# Patient Record
Sex: Male | Born: 1935 | Race: White | Hispanic: Yes | Marital: Married | Smoking: Never smoker
Health system: Southern US, Community
[De-identification: ages and names within clinical notes are randomized; demographics above are authoritative.]

## PROBLEM LIST (undated history)

## (undated) DIAGNOSIS — E039 Hypothyroidism, unspecified: Secondary | ICD-10-CM

## (undated) DIAGNOSIS — M5136 Other intervertebral disc degeneration, lumbar region: Secondary | ICD-10-CM

## (undated) DIAGNOSIS — M1990 Unspecified osteoarthritis, unspecified site: Secondary | ICD-10-CM

## (undated) DIAGNOSIS — G4733 Obstructive sleep apnea (adult) (pediatric): Secondary | ICD-10-CM

## (undated) DIAGNOSIS — M5030 Other cervical disc degeneration, unspecified cervical region: Secondary | ICD-10-CM

## (undated) DIAGNOSIS — F419 Anxiety disorder, unspecified: Secondary | ICD-10-CM

## (undated) DIAGNOSIS — H8113 Benign paroxysmal vertigo, bilateral: Secondary | ICD-10-CM

## (undated) DIAGNOSIS — IMO0001 Reserved for inherently not codable concepts without codable children: Secondary | ICD-10-CM

## (undated) DIAGNOSIS — K219 Gastro-esophageal reflux disease without esophagitis: Secondary | ICD-10-CM

## (undated) DIAGNOSIS — R413 Other amnesia: Secondary | ICD-10-CM

## (undated) DIAGNOSIS — E785 Hyperlipidemia, unspecified: Secondary | ICD-10-CM

## (undated) DIAGNOSIS — N320 Bladder-neck obstruction: Secondary | ICD-10-CM

## (undated) DIAGNOSIS — R29898 Other symptoms and signs involving the musculoskeletal system: Secondary | ICD-10-CM

## (undated) DIAGNOSIS — H4060X0 Glaucoma secondary to drugs, unspecified eye, stage unspecified: Secondary | ICD-10-CM

## (undated) DIAGNOSIS — G4700 Insomnia, unspecified: Secondary | ICD-10-CM

## (undated) DIAGNOSIS — R918 Other nonspecific abnormal finding of lung field: Secondary | ICD-10-CM

## (undated) DIAGNOSIS — Z974 Presence of external hearing-aid: Secondary | ICD-10-CM

## (undated) DIAGNOSIS — F329 Major depressive disorder, single episode, unspecified: Secondary | ICD-10-CM

## (undated) DIAGNOSIS — D229 Melanocytic nevi, unspecified: Secondary | ICD-10-CM

## (undated) DIAGNOSIS — J302 Other seasonal allergic rhinitis: Secondary | ICD-10-CM

## (undated) DIAGNOSIS — I739 Peripheral vascular disease, unspecified: Secondary | ICD-10-CM

## (undated) DIAGNOSIS — F32A Depression, unspecified: Secondary | ICD-10-CM

## (undated) DIAGNOSIS — R51 Headache: Secondary | ICD-10-CM

## (undated) DIAGNOSIS — R10826 Epigastric rebound abdominal tenderness: Secondary | ICD-10-CM

## (undated) DIAGNOSIS — G250 Essential tremor: Secondary | ICD-10-CM

## (undated) DIAGNOSIS — T380X5A Adverse effect of glucocorticoids and synthetic analogues, initial encounter: Secondary | ICD-10-CM

## (undated) DIAGNOSIS — N183 Chronic kidney disease, stage 3 unspecified: Secondary | ICD-10-CM

## (undated) DIAGNOSIS — R399 Unspecified symptoms and signs involving the genitourinary system: Secondary | ICD-10-CM

## (undated) DIAGNOSIS — E042 Nontoxic multinodular goiter: Secondary | ICD-10-CM

## (undated) DIAGNOSIS — K449 Diaphragmatic hernia without obstruction or gangrene: Secondary | ICD-10-CM

## (undated) DIAGNOSIS — J4530 Mild persistent asthma, uncomplicated: Secondary | ICD-10-CM

## (undated) DIAGNOSIS — M51369 Other intervertebral disc degeneration, lumbar region without mention of lumbar back pain or lower extremity pain: Secondary | ICD-10-CM

## (undated) DIAGNOSIS — Z9989 Dependence on other enabling machines and devices: Secondary | ICD-10-CM

## (undated) DIAGNOSIS — Z973 Presence of spectacles and contact lenses: Secondary | ICD-10-CM

## (undated) DIAGNOSIS — K08109 Complete loss of teeth, unspecified cause, unspecified class: Secondary | ICD-10-CM

## (undated) HISTORY — PX: BACK SURGERY: SHX140

## (undated) HISTORY — DX: Headache: R51

## (undated) HISTORY — PX: OTHER SURGICAL HISTORY: SHX169

## (undated) HISTORY — DX: Melanocytic nevi, unspecified: D22.9

## (undated) HISTORY — PX: CATARACT EXTRACTION W/ INTRAOCULAR LENS IMPLANT: SHX1309

## (undated) HISTORY — PX: CORNEAL TRANSPLANT: SHX108

## (undated) HISTORY — DX: Other intervertebral disc degeneration, lumbar region without mention of lumbar back pain or lower extremity pain: M51.369

## (undated) HISTORY — DX: Epigastric rebound abdominal tenderness: R10.826

## (undated) HISTORY — DX: Reserved for inherently not codable concepts without codable children: IMO0001

## (undated) HISTORY — DX: Obstructive sleep apnea (adult) (pediatric): G47.33

## (undated) HISTORY — DX: Insomnia, unspecified: G47.00

## (undated) HISTORY — DX: Major depressive disorder, single episode, unspecified: F32.9

## (undated) HISTORY — DX: Anxiety disorder, unspecified: F41.9

## (undated) HISTORY — DX: Hypothyroidism, unspecified: E03.9

## (undated) HISTORY — DX: Depression, unspecified: F32.A

## (undated) HISTORY — DX: Unspecified osteoarthritis, unspecified site: M19.90

## (undated) HISTORY — DX: Hyperlipidemia, unspecified: E78.5

## (undated) HISTORY — PX: EYE SURGERY: SHX253

## (undated) HISTORY — PX: CHOLECYSTECTOMY: SHX55

## (undated) HISTORY — PX: LUMBAR FUSION: SHX111

## (undated) HISTORY — PX: CHOLECYSTECTOMY OPEN: SUR202

## (undated) HISTORY — DX: Diaphragmatic hernia without obstruction or gangrene: K44.9

## (undated) HISTORY — DX: Gastro-esophageal reflux disease without esophagitis: K21.9

## (undated) HISTORY — DX: Other intervertebral disc degeneration, lumbar region: M51.36

---
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
## 1998-11-08 ENCOUNTER — Ambulatory Visit (HOSPITAL_COMMUNITY): Admission: RE | Admit: 1998-11-08 | Discharge: 1998-11-08 | Payer: Self-pay | Admitting: *Deleted

## 1999-05-26 ENCOUNTER — Emergency Department (HOSPITAL_COMMUNITY): Admission: EM | Admit: 1999-05-26 | Discharge: 1999-05-26 | Payer: Self-pay | Admitting: Emergency Medicine

## 2000-04-10 ENCOUNTER — Ambulatory Visit (HOSPITAL_COMMUNITY): Admission: RE | Admit: 2000-04-10 | Discharge: 2000-04-10 | Payer: Self-pay | Admitting: Infectious Diseases

## 2000-04-10 ENCOUNTER — Encounter: Payer: Self-pay | Admitting: Infectious Diseases

## 2000-05-24 ENCOUNTER — Emergency Department (HOSPITAL_COMMUNITY): Admission: EM | Admit: 2000-05-24 | Discharge: 2000-05-24 | Payer: Self-pay | Admitting: Emergency Medicine

## 2000-06-11 ENCOUNTER — Ambulatory Visit (HOSPITAL_COMMUNITY): Admission: RE | Admit: 2000-06-11 | Discharge: 2000-06-11 | Payer: Self-pay | Admitting: Family Medicine

## 2000-06-11 ENCOUNTER — Encounter: Payer: Self-pay | Admitting: Family Medicine

## 2001-04-09 ENCOUNTER — Emergency Department (HOSPITAL_COMMUNITY): Admission: EM | Admit: 2001-04-09 | Discharge: 2001-04-10 | Payer: Self-pay | Admitting: Emergency Medicine

## 2001-04-09 ENCOUNTER — Encounter: Payer: Self-pay | Admitting: Emergency Medicine

## 2001-07-01 ENCOUNTER — Emergency Department (HOSPITAL_COMMUNITY): Admission: EM | Admit: 2001-07-01 | Discharge: 2001-07-01 | Payer: Self-pay | Admitting: Emergency Medicine

## 2002-06-10 ENCOUNTER — Encounter: Payer: Self-pay | Admitting: Internal Medicine

## 2002-06-10 ENCOUNTER — Encounter: Admission: RE | Admit: 2002-06-10 | Discharge: 2002-06-10 | Payer: Self-pay | Admitting: Internal Medicine

## 2002-07-28 ENCOUNTER — Ambulatory Visit (HOSPITAL_COMMUNITY): Admission: RE | Admit: 2002-07-28 | Discharge: 2002-07-28 | Payer: Self-pay | Admitting: Ophthalmology

## 2003-07-24 ENCOUNTER — Encounter (INDEPENDENT_AMBULATORY_CARE_PROVIDER_SITE_OTHER): Payer: Self-pay | Admitting: *Deleted

## 2003-07-24 ENCOUNTER — Ambulatory Visit (HOSPITAL_COMMUNITY): Admission: RE | Admit: 2003-07-24 | Discharge: 2003-07-24 | Payer: Self-pay | Admitting: Ophthalmology

## 2003-11-28 ENCOUNTER — Ambulatory Visit (HOSPITAL_COMMUNITY): Admission: RE | Admit: 2003-11-28 | Discharge: 2003-11-28 | Payer: Self-pay

## 2003-11-28 ENCOUNTER — Emergency Department (HOSPITAL_COMMUNITY): Admission: EM | Admit: 2003-11-28 | Discharge: 2003-11-28 | Payer: Self-pay | Admitting: Emergency Medicine

## 2003-12-29 ENCOUNTER — Ambulatory Visit: Payer: Self-pay | Admitting: *Deleted

## 2003-12-29 ENCOUNTER — Ambulatory Visit: Payer: Self-pay | Admitting: Family Medicine

## 2004-01-01 ENCOUNTER — Ambulatory Visit: Payer: Self-pay | Admitting: Nurse Practitioner

## 2004-01-11 ENCOUNTER — Ambulatory Visit: Payer: Self-pay | Admitting: Family Medicine

## 2004-01-22 ENCOUNTER — Ambulatory Visit: Payer: Self-pay | Admitting: Internal Medicine

## 2004-02-26 ENCOUNTER — Ambulatory Visit: Payer: Self-pay | Admitting: Family Medicine

## 2004-04-08 ENCOUNTER — Ambulatory Visit: Payer: Self-pay | Admitting: Internal Medicine

## 2004-05-21 ENCOUNTER — Ambulatory Visit: Payer: Self-pay | Admitting: Internal Medicine

## 2004-06-18 ENCOUNTER — Ambulatory Visit: Payer: Self-pay | Admitting: Internal Medicine

## 2004-06-18 ENCOUNTER — Ambulatory Visit (HOSPITAL_BASED_OUTPATIENT_CLINIC_OR_DEPARTMENT_OTHER): Admission: RE | Admit: 2004-06-18 | Discharge: 2004-06-18 | Payer: Self-pay | Admitting: Internal Medicine

## 2004-07-09 ENCOUNTER — Ambulatory Visit: Payer: Self-pay | Admitting: Internal Medicine

## 2004-10-30 ENCOUNTER — Ambulatory Visit (HOSPITAL_COMMUNITY): Admission: RE | Admit: 2004-10-30 | Discharge: 2004-10-30 | Payer: Self-pay | Admitting: Internal Medicine

## 2004-10-30 ENCOUNTER — Ambulatory Visit: Payer: Self-pay | Admitting: Internal Medicine

## 2004-10-31 ENCOUNTER — Ambulatory Visit: Payer: Self-pay | Admitting: Internal Medicine

## 2004-11-01 ENCOUNTER — Ambulatory Visit (HOSPITAL_COMMUNITY): Admission: RE | Admit: 2004-11-01 | Discharge: 2004-11-01 | Payer: Self-pay | Admitting: Internal Medicine

## 2004-12-24 ENCOUNTER — Ambulatory Visit: Payer: Self-pay | Admitting: Internal Medicine

## 2005-01-30 ENCOUNTER — Ambulatory Visit: Payer: Self-pay | Admitting: Internal Medicine

## 2005-03-04 ENCOUNTER — Ambulatory Visit: Payer: Self-pay | Admitting: Internal Medicine

## 2005-06-26 ENCOUNTER — Ambulatory Visit: Payer: Self-pay | Admitting: Internal Medicine

## 2005-11-26 ENCOUNTER — Ambulatory Visit: Payer: Self-pay | Admitting: Family Medicine

## 2005-12-03 ENCOUNTER — Ambulatory Visit (HOSPITAL_COMMUNITY): Admission: RE | Admit: 2005-12-03 | Discharge: 2005-12-03 | Payer: Self-pay | Admitting: Family Medicine

## 2005-12-03 ENCOUNTER — Ambulatory Visit: Payer: Self-pay | Admitting: Family Medicine

## 2005-12-12 ENCOUNTER — Ambulatory Visit: Payer: Self-pay | Admitting: Internal Medicine

## 2006-10-15 ENCOUNTER — Ambulatory Visit: Payer: Self-pay | Admitting: Internal Medicine

## 2006-12-16 ENCOUNTER — Encounter (INDEPENDENT_AMBULATORY_CARE_PROVIDER_SITE_OTHER): Payer: Self-pay | Admitting: *Deleted

## 2006-12-16 DIAGNOSIS — D751 Secondary polycythemia: Secondary | ICD-10-CM | POA: Insufficient documentation

## 2006-12-16 DIAGNOSIS — E291 Testicular hypofunction: Secondary | ICD-10-CM | POA: Insufficient documentation

## 2006-12-16 DIAGNOSIS — M1710 Unilateral primary osteoarthritis, unspecified knee: Secondary | ICD-10-CM | POA: Insufficient documentation

## 2006-12-16 DIAGNOSIS — E039 Hypothyroidism, unspecified: Secondary | ICD-10-CM | POA: Insufficient documentation

## 2006-12-16 DIAGNOSIS — IMO0002 Reserved for concepts with insufficient information to code with codable children: Secondary | ICD-10-CM

## 2006-12-16 DIAGNOSIS — K219 Gastro-esophageal reflux disease without esophagitis: Secondary | ICD-10-CM | POA: Insufficient documentation

## 2006-12-16 DIAGNOSIS — E785 Hyperlipidemia, unspecified: Secondary | ICD-10-CM | POA: Insufficient documentation

## 2006-12-16 DIAGNOSIS — G4733 Obstructive sleep apnea (adult) (pediatric): Secondary | ICD-10-CM | POA: Insufficient documentation

## 2007-01-27 ENCOUNTER — Ambulatory Visit: Payer: Self-pay | Admitting: Nurse Practitioner

## 2007-01-27 DIAGNOSIS — M545 Low back pain, unspecified: Secondary | ICD-10-CM | POA: Insufficient documentation

## 2007-01-27 DIAGNOSIS — M79609 Pain in unspecified limb: Secondary | ICD-10-CM | POA: Insufficient documentation

## 2007-01-28 ENCOUNTER — Ambulatory Visit (HOSPITAL_COMMUNITY): Admission: RE | Admit: 2007-01-28 | Discharge: 2007-01-28 | Payer: Self-pay | Admitting: Nurse Practitioner

## 2007-01-28 ENCOUNTER — Encounter (INDEPENDENT_AMBULATORY_CARE_PROVIDER_SITE_OTHER): Payer: Self-pay | Admitting: Nurse Practitioner

## 2007-01-28 LAB — CONVERTED CEMR LAB
ALT: 36 units/L (ref 0–53)
AST: 28 units/L (ref 0–37)
Albumin: 4.9 g/dL (ref 3.5–5.2)
Alkaline Phosphatase: 73 units/L (ref 39–117)
BUN: 18 mg/dL (ref 6–23)
Basophils Absolute: 0 10*3/uL (ref 0.0–0.1)
Basophils Relative: 1 % (ref 0–1)
CO2: 24 meq/L (ref 19–32)
Calcium: 9.9 mg/dL (ref 8.4–10.5)
Chloride: 102 meq/L (ref 96–112)
Creatinine, Ser: 1.46 mg/dL (ref 0.40–1.50)
Eosinophils Absolute: 0.1 10*3/uL (ref 0.0–0.7)
Eosinophils Relative: 2 % (ref 0–5)
Glucose, Bld: 77 mg/dL (ref 70–99)
HCT: 50.3 % (ref 39.0–52.0)
Hemoglobin: 17 g/dL (ref 13.0–17.0)
Lymphocytes Relative: 28 % (ref 12–46)
Lymphs Abs: 1.9 10*3/uL (ref 0.7–3.3)
MCHC: 33.8 g/dL (ref 30.0–36.0)
MCV: 93 fL (ref 78.0–100.0)
Monocytes Absolute: 0.5 10*3/uL (ref 0.2–0.7)
Monocytes Relative: 8 % (ref 3–11)
Neutro Abs: 4 10*3/uL (ref 1.7–7.7)
Neutrophils Relative %: 61 % (ref 43–77)
Platelets: 243 10*3/uL (ref 150–400)
Potassium: 4 meq/L (ref 3.5–5.3)
RBC: 5.41 M/uL (ref 4.22–5.81)
RDW: 14 % (ref 11.5–14.0)
Sodium: 139 meq/L (ref 135–145)
TSH: 7.276 microintl units/mL — ABNORMAL HIGH (ref 0.350–5.50)
Total Bilirubin: 0.8 mg/dL (ref 0.3–1.2)
Total Protein: 7.7 g/dL (ref 6.0–8.3)
WBC: 6.6 10*3/uL (ref 4.0–10.5)

## 2007-01-29 ENCOUNTER — Encounter (INDEPENDENT_AMBULATORY_CARE_PROVIDER_SITE_OTHER): Payer: Self-pay | Admitting: Nurse Practitioner

## 2007-02-03 ENCOUNTER — Ambulatory Visit: Payer: Self-pay | Admitting: Nurse Practitioner

## 2007-02-03 LAB — CONVERTED CEMR LAB
Cholesterol: 208 mg/dL — ABNORMAL HIGH (ref 0–200)
HDL: 35 mg/dL — ABNORMAL LOW (ref >39)
LDL Cholesterol: 126 mg/dL — ABNORMAL HIGH (ref 0–99)
Total CHOL/HDL Ratio: 5.9
Triglycerides: 234 mg/dL — ABNORMAL HIGH (ref <150)
VLDL: 47 mg/dL — ABNORMAL HIGH (ref 0–40)

## 2007-02-04 ENCOUNTER — Encounter (INDEPENDENT_AMBULATORY_CARE_PROVIDER_SITE_OTHER): Payer: Self-pay | Admitting: Nurse Practitioner

## 2007-02-09 ENCOUNTER — Encounter (INDEPENDENT_AMBULATORY_CARE_PROVIDER_SITE_OTHER): Payer: Self-pay | Admitting: Nurse Practitioner

## 2007-03-01 ENCOUNTER — Ambulatory Visit: Payer: Self-pay | Admitting: Nurse Practitioner

## 2007-03-01 DIAGNOSIS — D239 Other benign neoplasm of skin, unspecified: Secondary | ICD-10-CM | POA: Insufficient documentation

## 2007-03-01 LAB — CONVERTED CEMR LAB
Cholesterol, target level: 200 mg/dL
HDL goal, serum: 40 mg/dL
LDL Goal: 130 mg/dL

## 2007-03-05 ENCOUNTER — Ambulatory Visit (HOSPITAL_COMMUNITY): Admission: RE | Admit: 2007-03-05 | Discharge: 2007-03-05 | Payer: Self-pay | Admitting: Family Medicine

## 2007-03-16 ENCOUNTER — Ambulatory Visit: Payer: Self-pay | Admitting: Nurse Practitioner

## 2007-03-18 ENCOUNTER — Encounter (INDEPENDENT_AMBULATORY_CARE_PROVIDER_SITE_OTHER): Payer: Self-pay | Admitting: Nurse Practitioner

## 2007-04-02 ENCOUNTER — Emergency Department (HOSPITAL_COMMUNITY): Admission: EM | Admit: 2007-04-02 | Discharge: 2007-04-02 | Payer: Self-pay | Admitting: Emergency Medicine

## 2007-04-02 LAB — CONVERTED CEMR LAB
BUN: 15 mg/dL
Basophils Absolute: 0 10*3/uL
Basophils Relative: 0 %
Bilirubin Urine: NEGATIVE
CO2: 26 meq/L
Calcium: 10 mg/dL
Chloride: 103 meq/L
Creatinine, Ser: 1.17 mg/dL
Eosinophils Absolute: 0.1 10*3/uL
Eosinophils Relative: 1 %
Glucose, Bld: 95 mg/dL
HCT: 50.3 %
Hemoglobin, Urine: NEGATIVE
Hemoglobin: 17.2 g/dL
Ketones, ur: NEGATIVE mg/dL
Leukocytes, UA: NEGATIVE
Lymphocytes Relative: 11 %
Lymphs Abs: 1.4 10*3/uL
MCHC: 34.2 g/dL
MCV: 93.7 fL
Monocytes Absolute: 0.7 10*3/uL
Monocytes Relative: 5 %
Neutro Abs: 10.8 10*3/uL
Neutrophils Relative %: 83 %
Nitrite: NEGATIVE
Platelets: 221 10*3/uL
Potassium: 3.7 meq/L
Protein, ur: NEGATIVE mg/dL
RBC: 5.37 M/uL
RDW: 13.7 %
Sodium: 136 meq/L
Specific Gravity, Urine: 1.014
Urine Glucose: NEGATIVE mg/dL
Urobilinogen, UA: 0.2
WBC: 13 10*3/uL
pH: 6.5

## 2007-04-07 ENCOUNTER — Ambulatory Visit: Payer: Self-pay | Admitting: Nurse Practitioner

## 2007-04-07 DIAGNOSIS — K649 Unspecified hemorrhoids: Secondary | ICD-10-CM | POA: Insufficient documentation

## 2007-04-08 LAB — CONVERTED CEMR LAB: TSH: 2.76 microintl units/mL (ref 0.350–5.50)

## 2007-04-26 ENCOUNTER — Telehealth (INDEPENDENT_AMBULATORY_CARE_PROVIDER_SITE_OTHER): Payer: Self-pay | Admitting: Nurse Practitioner

## 2007-06-01 ENCOUNTER — Telehealth (INDEPENDENT_AMBULATORY_CARE_PROVIDER_SITE_OTHER): Payer: Self-pay | Admitting: Nurse Practitioner

## 2007-07-14 ENCOUNTER — Ambulatory Visit: Payer: Self-pay | Admitting: Nurse Practitioner

## 2007-07-21 ENCOUNTER — Encounter (INDEPENDENT_AMBULATORY_CARE_PROVIDER_SITE_OTHER): Payer: Self-pay | Admitting: Nurse Practitioner

## 2007-07-28 ENCOUNTER — Ambulatory Visit: Payer: Self-pay | Admitting: Nurse Practitioner

## 2007-08-02 LAB — CONVERTED CEMR LAB
ALT: 32 units/L (ref 0–53)
AST: 25 units/L (ref 0–37)
Albumin: 4.3 g/dL (ref 3.5–5.2)
Alkaline Phosphatase: 51 units/L (ref 39–117)
Bilirubin, Direct: 0.2 mg/dL (ref 0.0–0.3)
Cholesterol: 183 mg/dL (ref 0–200)
HDL: 31 mg/dL — ABNORMAL LOW (ref >39)
Indirect Bilirubin: 0.9 mg/dL (ref 0.0–0.9)
LDL Cholesterol: 103 mg/dL — ABNORMAL HIGH (ref 0–99)
Total Bilirubin: 1.1 mg/dL (ref 0.3–1.2)
Total CHOL/HDL Ratio: 5.9
Total Protein: 6.7 g/dL (ref 6.0–8.3)
Triglycerides: 245 mg/dL — ABNORMAL HIGH (ref <150)
VLDL: 49 mg/dL — ABNORMAL HIGH (ref 0–40)

## 2007-12-02 ENCOUNTER — Ambulatory Visit: Payer: Self-pay | Admitting: Internal Medicine

## 2007-12-02 DIAGNOSIS — R109 Unspecified abdominal pain: Secondary | ICD-10-CM | POA: Insufficient documentation

## 2007-12-02 DIAGNOSIS — J069 Acute upper respiratory infection, unspecified: Secondary | ICD-10-CM | POA: Insufficient documentation

## 2007-12-08 ENCOUNTER — Telehealth (INDEPENDENT_AMBULATORY_CARE_PROVIDER_SITE_OTHER): Payer: Self-pay | Admitting: *Deleted

## 2008-01-20 ENCOUNTER — Encounter: Admission: RE | Admit: 2008-01-20 | Discharge: 2008-03-01 | Payer: Self-pay | Admitting: Internal Medicine

## 2008-01-26 ENCOUNTER — Ambulatory Visit: Payer: Self-pay | Admitting: Nurse Practitioner

## 2008-01-26 DIAGNOSIS — N400 Enlarged prostate without lower urinary tract symptoms: Secondary | ICD-10-CM | POA: Insufficient documentation

## 2008-01-26 LAB — CONVERTED CEMR LAB
Bilirubin Urine: NEGATIVE
Blood in Urine, dipstick: NEGATIVE
Glucose, Urine, Semiquant: NEGATIVE
Ketones, urine, test strip: NEGATIVE
LDL Goal: 100 mg/dL
Nitrite: NEGATIVE
PSA: 0.76 ng/mL (ref 0.10–4.00)
Protein, U semiquant: NEGATIVE
Sex Hormone Binding: 42 nmol/L (ref 13–71)
Specific Gravity, Urine: <1.005
Testosterone Free: 80.6 pg/mL (ref 47.0–244.0)
Testosterone-% Free: 1.8 % (ref 1.6–2.9)
Testosterone: 453.59 ng/dL (ref 350–890)
Urobilinogen, UA: 0.2
WBC Urine, dipstick: NEGATIVE
pH: 6

## 2008-01-27 ENCOUNTER — Encounter (INDEPENDENT_AMBULATORY_CARE_PROVIDER_SITE_OTHER): Payer: Self-pay | Admitting: Nurse Practitioner

## 2008-02-03 ENCOUNTER — Encounter (INDEPENDENT_AMBULATORY_CARE_PROVIDER_SITE_OTHER): Payer: Self-pay | Admitting: Nurse Practitioner

## 2008-02-23 ENCOUNTER — Ambulatory Visit: Payer: Self-pay | Admitting: Nurse Practitioner

## 2008-02-23 LAB — CONVERTED CEMR LAB
BUN: 17 mg/dL (ref 6–23)
CO2: 23 meq/L (ref 19–32)
Calcium: 9.6 mg/dL (ref 8.4–10.5)
Chloride: 106 meq/L (ref 96–112)
Creatinine, Ser: 1.37 mg/dL (ref 0.40–1.50)
Glucose, Bld: 78 mg/dL (ref 70–99)
Potassium: 4.2 meq/L (ref 3.5–5.3)
Sodium: 140 meq/L (ref 135–145)

## 2008-02-29 ENCOUNTER — Ambulatory Visit (HOSPITAL_COMMUNITY): Admission: RE | Admit: 2008-02-29 | Discharge: 2008-02-29 | Payer: Self-pay | Admitting: Family Medicine

## 2008-02-29 DIAGNOSIS — M5126 Other intervertebral disc displacement, lumbar region: Secondary | ICD-10-CM | POA: Insufficient documentation

## 2008-03-01 ENCOUNTER — Encounter (INDEPENDENT_AMBULATORY_CARE_PROVIDER_SITE_OTHER): Payer: Self-pay | Admitting: Nurse Practitioner

## 2008-03-09 ENCOUNTER — Ambulatory Visit: Payer: Self-pay | Admitting: Nurse Practitioner

## 2008-03-10 ENCOUNTER — Telehealth (INDEPENDENT_AMBULATORY_CARE_PROVIDER_SITE_OTHER): Payer: Self-pay | Admitting: Nurse Practitioner

## 2008-03-22 ENCOUNTER — Ambulatory Visit: Payer: Self-pay | Admitting: Nurse Practitioner

## 2008-03-22 LAB — CONVERTED CEMR LAB
Cholesterol: 140 mg/dL (ref 0–200)
HDL: 30 mg/dL — ABNORMAL LOW (ref >39)
LDL Cholesterol: 63 mg/dL (ref 0–99)
TSH: 2.803 microintl units/mL (ref 0.350–4.50)
Total CHOL/HDL Ratio: 4.7
Triglycerides: 235 mg/dL — ABNORMAL HIGH (ref <150)
VLDL: 47 mg/dL — ABNORMAL HIGH (ref 0–40)

## 2008-03-27 ENCOUNTER — Encounter (INDEPENDENT_AMBULATORY_CARE_PROVIDER_SITE_OTHER): Payer: Self-pay | Admitting: Nurse Practitioner

## 2008-04-10 ENCOUNTER — Encounter (INDEPENDENT_AMBULATORY_CARE_PROVIDER_SITE_OTHER): Payer: Self-pay | Admitting: Nurse Practitioner

## 2008-09-12 ENCOUNTER — Ambulatory Visit: Payer: Self-pay | Admitting: Nurse Practitioner

## 2008-09-13 ENCOUNTER — Encounter (INDEPENDENT_AMBULATORY_CARE_PROVIDER_SITE_OTHER): Payer: Self-pay | Admitting: Nurse Practitioner

## 2008-09-13 LAB — CONVERTED CEMR LAB
ALT: 22 units/L (ref 0–53)
AST: 23 units/L (ref 0–37)
Albumin: 4.7 g/dL (ref 3.5–5.2)
Alkaline Phosphatase: 74 units/L (ref 39–117)
BUN: 18 mg/dL (ref 6–23)
Basophils Absolute: 0 10*3/uL (ref 0.0–0.1)
Basophils Relative: 0 % (ref 0–1)
CO2: 23 meq/L (ref 19–32)
Calcium: 9.6 mg/dL (ref 8.4–10.5)
Chloride: 106 meq/L (ref 96–112)
Cholesterol: 160 mg/dL (ref 0–200)
Creatinine, Ser: 1 mg/dL (ref 0.40–1.50)
Eosinophils Absolute: 0.1 10*3/uL (ref 0.0–0.7)
Eosinophils Relative: 2 % (ref 0–5)
Glucose, Bld: 69 mg/dL — ABNORMAL LOW (ref 70–99)
HCT: 52.4 % — ABNORMAL HIGH (ref 39.0–52.0)
HDL: 34 mg/dL — ABNORMAL LOW (ref >39)
Hemoglobin: 17.2 g/dL — ABNORMAL HIGH (ref 13.0–17.0)
LDL Cholesterol: 82 mg/dL (ref 0–99)
Lymphocytes Relative: 26 % (ref 12–46)
Lymphs Abs: 1.4 10*3/uL (ref 0.7–4.0)
MCHC: 32.8 g/dL (ref 30.0–36.0)
MCV: 92.6 fL (ref 78.0–100.0)
Monocytes Absolute: 0.4 10*3/uL (ref 0.1–1.0)
Monocytes Relative: 8 % (ref 3–12)
Neutro Abs: 3.4 10*3/uL (ref 1.7–7.7)
Neutrophils Relative %: 64 % (ref 43–77)
Platelets: 200 10*3/uL (ref 150–400)
Potassium: 4.4 meq/L (ref 3.5–5.3)
RBC: 5.66 M/uL (ref 4.22–5.81)
RDW: 14.3 % (ref 11.5–15.5)
Sodium: 141 meq/L (ref 135–145)
TSH: 0.762 microintl units/mL (ref 0.350–4.500)
Total Bilirubin: 1.2 mg/dL (ref 0.3–1.2)
Total CHOL/HDL Ratio: 4.7
Total Protein: 7.8 g/dL (ref 6.0–8.3)
Triglycerides: 220 mg/dL — ABNORMAL HIGH (ref <150)
VLDL: 44 mg/dL — ABNORMAL HIGH (ref 0–40)
WBC: 5.4 10*3/uL (ref 4.0–10.5)

## 2008-12-27 ENCOUNTER — Ambulatory Visit: Payer: Self-pay | Admitting: Nurse Practitioner

## 2008-12-28 LAB — CONVERTED CEMR LAB: TSH: 10.035 microintl units/mL — ABNORMAL HIGH (ref 0.350–4.500)

## 2009-03-16 ENCOUNTER — Ambulatory Visit: Payer: Self-pay | Admitting: Nurse Practitioner

## 2009-03-21 ENCOUNTER — Ambulatory Visit: Payer: Self-pay | Admitting: Nurse Practitioner

## 2009-03-21 DIAGNOSIS — R519 Headache, unspecified: Secondary | ICD-10-CM | POA: Insufficient documentation

## 2009-03-21 DIAGNOSIS — R51 Headache: Secondary | ICD-10-CM | POA: Insufficient documentation

## 2009-03-21 LAB — CONVERTED CEMR LAB: TSH: 2.734 microintl units/mL (ref 0.350–4.500)

## 2009-03-22 ENCOUNTER — Ambulatory Visit (HOSPITAL_COMMUNITY): Admission: RE | Admit: 2009-03-22 | Discharge: 2009-03-22 | Payer: Self-pay | Admitting: Internal Medicine

## 2009-07-06 ENCOUNTER — Ambulatory Visit: Payer: Self-pay | Admitting: Nurse Practitioner

## 2009-07-06 LAB — CONVERTED CEMR LAB: TSH: 1.983 microintl units/mL (ref 0.350–4.500)

## 2009-07-09 ENCOUNTER — Ambulatory Visit: Payer: Self-pay | Admitting: Nurse Practitioner

## 2009-07-10 ENCOUNTER — Telehealth (INDEPENDENT_AMBULATORY_CARE_PROVIDER_SITE_OTHER): Payer: Self-pay | Admitting: Nurse Practitioner

## 2009-09-17 ENCOUNTER — Ambulatory Visit: Payer: Self-pay | Admitting: Internal Medicine

## 2009-09-17 DIAGNOSIS — H5710 Ocular pain, unspecified eye: Secondary | ICD-10-CM | POA: Insufficient documentation

## 2009-10-11 ENCOUNTER — Telehealth (INDEPENDENT_AMBULATORY_CARE_PROVIDER_SITE_OTHER): Payer: Self-pay | Admitting: Internal Medicine

## 2009-10-12 ENCOUNTER — Telehealth (INDEPENDENT_AMBULATORY_CARE_PROVIDER_SITE_OTHER): Payer: Self-pay | Admitting: Nurse Practitioner

## 2009-10-12 ENCOUNTER — Ambulatory Visit: Payer: Self-pay | Admitting: Internal Medicine

## 2009-10-12 DIAGNOSIS — H269 Unspecified cataract: Secondary | ICD-10-CM | POA: Insufficient documentation

## 2009-10-16 ENCOUNTER — Encounter (INDEPENDENT_AMBULATORY_CARE_PROVIDER_SITE_OTHER): Payer: Self-pay | Admitting: Nurse Practitioner

## 2010-01-23 ENCOUNTER — Ambulatory Visit: Payer: Self-pay | Admitting: Nurse Practitioner

## 2010-02-06 ENCOUNTER — Telehealth (INDEPENDENT_AMBULATORY_CARE_PROVIDER_SITE_OTHER): Payer: Self-pay | Admitting: Nurse Practitioner

## 2010-02-06 ENCOUNTER — Emergency Department (HOSPITAL_COMMUNITY)
Admission: EM | Admit: 2010-02-06 | Discharge: 2010-02-06 | Payer: Self-pay | Source: Home / Self Care | Admitting: Family Medicine

## 2010-03-08 ENCOUNTER — Ambulatory Visit: Payer: Self-pay | Admitting: Internal Medicine

## 2010-04-30 ENCOUNTER — Telehealth (INDEPENDENT_AMBULATORY_CARE_PROVIDER_SITE_OTHER): Payer: Self-pay | Admitting: Nurse Practitioner

## 2010-04-30 ENCOUNTER — Encounter (INDEPENDENT_AMBULATORY_CARE_PROVIDER_SITE_OTHER): Payer: Self-pay | Admitting: Nurse Practitioner

## 2010-04-30 ENCOUNTER — Ambulatory Visit: Admit: 2010-04-30 | Payer: Self-pay | Admitting: Nurse Practitioner

## 2010-04-30 DIAGNOSIS — H9190 Unspecified hearing loss, unspecified ear: Secondary | ICD-10-CM | POA: Insufficient documentation

## 2010-04-30 NOTE — Letter (Signed)
Summary: *Referral Letter  [facility]  [address] [place], [place] [address]   Phone: [phone]  Fax: [phone]    10/12/2009  Thank you in advance for agreeing to see my patient:  [facility] [address] [place], [place]  [address]  Phone: [phone]  Reason for Referral: Pt. with hx of corneal transplant of right eye with poor vision from that eye.  Reportedly with 20/200 vision on left with a cataract.  A local ophthalmologist has recommended cataract extraction from left eye, but pt. not able to afford to have done.  Does not meet criteria for [facility] as not working.  Procedures Requested: Evaluation for cataract extraction and actual surgery if deemed necessary  Current Medical Problems: 1)  CATARACT, LEFT EYE (ICD-366.9) 2)  EYE PAIN, RIGHT (ICD-379.91) 3)  HEADACHE (ICD-784.0) 4)  HERNIATED LUMBOSACRAL DISC (ICD-722.10) 5)  HYPERTROPHY PROSTATE W/O UR OBST & OTH LUTS (ICD-600.00) 6)  URI (ICD-465.9) 7)  GROIN PAIN (ICD-789.09) 8)  HEMORRHOIDS (ICD-455.6) 9)  NEVUS (ICD-216.9) 10)  NEED PROPHYLACTIC VACCINATION&INOCULATION FLU (ICD-V04.81) 11)  HEEL PAIN, RIGHT (ICD-729.5) 12)  BACK PAIN, LUMBAR (ICD-724.2) 13)  POLYCYTHEMIA (ICD-289.0) 14)  OBSTRUCTIVE SLEEP APNEA (ICD-327.23) 15)  ARTHRITIS, RIGHT KNEE (ICD-716.96) 16)  GERD (ICD-530.81) 17)  HYPERLIPIDEMIA (ICD-272.4) 18)  HYPOGONADISM (ICD-257.2) 19)  HYPOTHYROIDISM (ICD-244.9)   Current Medications: 1)  ACIPHEX 20 MG TBEC (RABEPRAZOLE SODIUM) One tablet by mouth daily for stomach 2)  ANUCORT-HC 25 MG  SUPP (HYDROCORTISONE ACETATE) 1 intrarectally two times a day as needed for hemorrhoids 3)  TRAMADOL HCL 50 MG TABS (TRAMADOL HCL) One tablet by mouth two times a day as needed for pain 4)  GEMFIBROZIL 600 MG TABS (GEMFIBROZIL) 1 tablet by mouth 30 minutes before meals 5)  NEURONTIN 300 MG CAPS (GABAPENTIN) One capsule by mouth nightly for for pain 6)  LEVOTHYROXINE  SODIUM 75 MCG TABS (LEVOTHYROXINE SODIUM) One tablet by mouth daily for thyroid 7)  TRAMADOL HCL 50 MG TABS (TRAMADOL HCL) One tablet by mouth two times a day as needed for pain 8)  IBUPROFEN 800 MG TABS (IBUPROFEN) One tablet by mouth three times a day as needed for pain   Past Medical History: 1)  ARTHRITIS, RIGHT KNEE (ICD-716.96) 2)  GERD (ICD-530.81) 3)  HYPERLIPIDEMIA (ICD-272.4) 4)  HYPOGONADISM (ICD-257.2) 5)  HYPOTHYROIDISM (ICD-244.9)   Prior History of Blood Transfusions:   Pertinent Labs:    Thank you again for agreeing to see our patient; please contact us if you have any further questions or need additional information.  Sincerely,  Julieanne Manson MD

## 2010-04-30 NOTE — Letter (Signed)
Summary: *Referral Letter  [facility]  [address] [place], [place] [address]   Phone: [phone]  Fax: [phone]    04/07/2007  Thank you in advance for agreeing to see my patient:  [facility] [address] [place], [place]  [address]  Phone: [phone]  Reason for Referral: External Hemorrhoids  Procedures Requested: Hemorrhoidectomy  Current Medical Problems: 1)  HEMORRHOIDS (ICD-455.6) 2)  NEVUS (ICD-216.9) 3)  NEED PROPHYLACTIC VACCINATION&INOCULATION FLU (ICD-V04.81) 4)  HEEL PAIN, RIGHT (ICD-729.5) 5)  BACK PAIN, LUMBAR (ICD-724.2) 6)  POLYCYTHEMIA (ICD-289.0) 7)  OBSTRUCTIVE SLEEP APNEA (ICD-327.23) 8)  ARTHRITIS, RIGHT KNEE (ICD-716.96) 9)  GERD (ICD-530.81) 10)  HYPERLIPIDEMIA (ICD-272.4) 11)  HYPOGONADISM (ICD-257.2) 12)  HYPOTHYROIDISM (ICD-244.9)   Current Medications: 1)  LEVOTHROID 88 MCG  TABS (LEVOTHYROXINE SODIUM) 1 tablet by mouth daily for thyroid 2)  PROTONIX 40 MG  TBEC (PANTOPRAZOLE SODIUM) once daily 3)  IBUPROFEN 800 MG  TABS (IBUPROFEN) one by mouth every 8 hour prn 4)  TRICOR 145 MG  TABS (FENOFIBRATE) 1 tablet by mouth daily for cholesterol 5)  NEURONTIN 300 MG  CAPS (GABAPENTIN) 1 tablet by mouth two times a day for back 6)  ANUCORT-HC 25 MG  SUPP (HYDROCORTISONE ACETATE) 1 intrarectally two times a day as needed for hemorrhoids 7)  ULTRAM 50 MG  TABS (TRAMADOL HCL) 1 tablet by mouth daily two times a day as needed for pain   Past Medical History: 1)  ARTHRITIS, RIGHT KNEE (ICD-716.96) 2)  GERD (ICD-530.81) 3)  HYPERLIPIDEMIA (ICD-272.4) 4)  HYPOGONADISM (ICD-257.2) 5)  HYPOTHYROIDISM (ICD-244.9)  Thank you again for agreeing to see our patient; please contact us if you have any further questions or need additional information.  Sincerely,  [name] FNP

## 2010-04-30 NOTE — Letter (Signed)
Summary: *HSN Results Follow up  [facility]  [address] [place], [place] [address]   Phone: [phone]  Fax: [phone]      07/09/2009   [name] [address] [place], [place]  [address]   Dear  Mr. [name],                            ____S.Drinkard,FNP   ____D. Gore,FNP       ____B. McPherson,MD   ____V. Rankins,MD    ____E. Mulberry,MD    _X___N. Daphine Deutscher, FNP  ____D. Reche Dixon, MD    ____K. Philipp Deputy, MD    ____Other     This letter is to inform you that your recent test(s):  _______Pap Smear    ___X____Lab Test     _______X-ray    ___X____ is within acceptable limits  _______ requires a medication change  _______ requires a follow-up lab visit  _______ requires a follow-up visit with your provider   Comments: Thyroid labs are normal.       _________________________________________________________ If you have any questions, please contact our office                     Sincerely,    [name] FNP [facility]

## 2010-04-30 NOTE — Miscellaneous (Signed)
Summary: PROGRESS REORT  PROGRESS REORT   Imported ByArta Bruce 03/07/2008 14:36:24  _____________________________________________________________________  External Attachment:    Type:   Image     Comment:   External Document

## 2010-04-30 NOTE — Letter (Signed)
Summary: TEST ORDER FORM  TEST ORDER FORM   Imported By: Leodis Rains 04/30/2009 17:04:51  _____________________________________________________________________  External Attachment:    Type:   Image     Comment:   External Document

## 2010-04-30 NOTE — Assessment & Plan Note (Signed)
Summary: FLU SHOT//MC  Nurse Visit   Allergies: No Known Drug Allergies  Immunizations Administered:  Influenza Vaccine # 1:    Vaccine Type: Fluvax 3+    Site: left deltoid    Mfr: GlaxoSmithKline    Dose: 0.5 ml    Route: IM    Given by: Gaylyn Cheers RN    Exp. Date: 09/28/2010    Lot #: [id number]    VIS given: 10/23/09 version given January 23, 2010.  Flu Vaccine Consent Questions:    Do you have a history of severe allergic reactions to this vaccine? no    Any prior history of allergic reactions to egg and/or gelatin? no    Do you have a sensitivity to the preservative Thimersol? no    Do you have a past history of Guillan-Barre Syndrome? no    Do you currently have an acute febrile illness? no    Have you ever had a severe reaction to latex? no    Vaccine information given and explained to patient? yes  Orders Added: 1)  Flu Vaccine 44yrs + [90658] 2)  Admin 1st Vaccine [90471] 3)  Est. Patient Nurse visit [09003]

## 2010-04-30 NOTE — Progress Notes (Signed)
Summary: Requesting assistant from Dr Delrae Alfred   Phone Note Call from Patient Call back at [facility] Phone [phone] Call back at [phone]   Summary of Call: The pt wanted to inform to Dr Delrae Alfred that he wanted to know if there is any place that can help him financially to pay the upcoming surgery (catarats) .  Pt suppostly has to pay at front around $3,000.  Pt only has the orange card.  Once again, the pt went to the first appointment with Dr. Richarda Overlie [phone]).  Pt usually see Daphine Deutscher FnP but the last time he saw Dr Delrae Alfred and that is why he is requesting her assistant.  Yuriy Cui Md Initial call taken by: Manon Hilding,  October 11, 2009 2:49 PM  Follow-up for Phone Call        Pt. requested an appt. per Ashby Dawes and seen today Follow-up by: Julieanne Manson MD,  October 12, 2009 3:57 PM

## 2010-04-30 NOTE — Letter (Signed)
Summary: REQUESTING RECORDS FROM DR.STONEBURNER  REQUESTING RECORDS FROM DR.STONEBURNER   Imported By: Arta Bruce 11/19/2009 11:44:09  _____________________________________________________________________  External Attachment:    Type:   Image     Comment:   External Document

## 2010-04-30 NOTE — Progress Notes (Signed)
Summary: Refills  Phone Note Call from Patient   Caller: Patient Reason for Call: Refill Medication Summary of Call: PT NEEDS A REFILL OF TRICOR 145 MG,LEVOTROIN 88 MG & PROTONIX  ([facility]) PLEASE, CALL PT TO LET HIM  KNOW WHEN HE CAN PICK UP THANK YOU. Initial call taken by: Cheryll Dessert,  June 01, 2007 11:23 AM  Follow-up for Phone Call        Rx printed and put oin basket to fax.  notify pt when they have been faxed so that he can go pick up from pharmacy. Follow-up by: [name] FNP,  June 02, 2007 8:10 AM  Additional Follow-up for Phone Call Additional follow up Details #1::        Phone Call Completed Additional Follow-up by: Levon Hedger,  June 02, 2007 10:48 AM    New/Updated Medications: PROTONIX 40 MG  TBEC (PANTOPRAZOLE SODIUM) 1 tablet by mouth daily for stomach LEVOTHROID 88 MCG  TABS (LEVOTHYROXINE SODIUM) 1 tablet by mouth daily   Prescriptions: LEVOTHROID 88 MCG  TABS (LEVOTHYROXINE SODIUM) 1 tablet by mouth daily  #30 x 3   Entered and Authorized by:   [name] FNP   Signed by:   [name] FNP on 06/02/2007   Method used:   Printed then faxed to ...       [facility]       [address] [place], [place]  [address]       Ph: [phone]       Fax: [phone]   RxID:   [id number] TRICOR 145 MG  TABS (FENOFIBRATE) 1 tablet by mouth daily for cholesterol  #30 x 3   Entered and Authorized by:   [name] FNP   Signed by:   [name] FNP on 06/02/2007   Method used:   Printed then faxed to ...       [facility]       [address] [place], [place]  [address]       Ph: [phone]       Fax: [phone]   RxID:   [id number] PROTONIX 40 MG  TBEC (PANTOPRAZOLE SODIUM) 1 tablet by mouth daily for stomach  #30 x 3   Entered and Authorized by:   [name] FNP   Signed by:   [name] FNP on 06/02/2007   Method used:   Print  then Give to Patient   RxID:   [id number]

## 2010-04-30 NOTE — Letter (Signed)
Summary: Handout Printed  Printed Handout:  - Seborrheic Dermatitis (Seborrhea)

## 2010-04-30 NOTE — Assessment & Plan Note (Signed)
Summary: GERD/Hypercholesterolemia   Vital Signs:  Patient profile:   77 year old male Weight:      161 pounds Temp:     97.4 degrees F Pulse rate:   94 / minute Pulse rhythm:   regular Resp:     18 per minute BP sitting:   131 / 72  (left arm) Cuff size:   regular  Vitals Entered By: Vesta Mixer CMA (December 27, 2008 3:08 PM) CC: requesting med refills, Lipid Management, Abdominal Pain Is Patient Diabetic? No Pain Assessment Patient in pain? no       Does patient need assistance? Ambulation Normal   CC:  requesting med refills, Lipid Management, and Abdominal Pain.  History of Present Illness:  Pt into the office for refills.  Pt has completed his supply of cholesterol medications.  Presents with a old bottle of diclofenac that he is requesting to help with his knees.  Back pain - Ongoing at this time.  Pt is requesting neurontin for intermittent back pain. He has been to neurosurgeon here for evaluation then he went to [place].  Pt did recieve a spinal injection in [place] which pt reports was not very beneficial.  Hypothyroidism - no meds in over 2 months.  when labs were taken 08/2008 pt was on medications.  completed his supply of meds is the reason pt states he is no longer taking  Spanish interpreter present with pt today  Dyspepsia History:      He has no alarm features of dyspepsia including no history of melena, hematochezia, dysphagia, persistent vomiting, or involuntary weight loss > 5%.  There is a prior history of GERD.  The patient does not have a prior history of documented ulcer disease.  The dominant symptom is heartburn or acid reflux.  An H-2 blocker medication is not currently being taken.  He has no history of a positive H. Pylori serology.  No previous upper endoscopy has been done.    Lipid Management History:      Positive NCEP/ATP III risk factors include male age 58 years old or older and HDL cholesterol less than 40.  Negative NCEP/ATP  III risk factors include non-diabetic, non-tobacco-user status, non-hypertensive, no ASHD (atherosclerotic heart disease), no prior stroke/TIA, no peripheral vascular disease, and no history of aortic aneurysm.        The patient states that he knows about the "Therapeutic Lifestyle Change" diet.  His compliance with the TLC diet is good.  He expresses no side effects from his lipid-lowering medication.  The patient denies any symptoms to suggest myopathy or liver disease.  Comments: No medications for the past 3 days labs done last in 08/2008.    Allergies (verified): No Known Drug Allergies  Review of Systems General:  Denies fatigue. CV:  Denies chest pain or discomfort. Resp:  Denies cough. GI:  Denies abdominal pain, nausea, and vomiting. MS:  Complains of joint pain and low back pain.  Physical Exam  General:  alert.   Head:  male-pattern balding.   Lungs:  normal breath sounds.   Heart:  normal rate and regular rhythm.   Abdomen:  normal bowel sounds.   Msk:  up to the exam table Neurologic:  alert & oriented X3 and gait normal.   Skin:  color normal.   Psych:  hispanic interpreter needed   Impression & Recommendations:  Problem # 1:  HYPERLIPIDEMIA (ICD-272.4) will refill meds His updated medication list for this problem includes:    Gemfibrozil 600  Mg Tabs (Gemfibrozil) .Marland Kitchen... 1 tablet by mouth 30 minutes before meals  Problem # 2:  GERD (ICD-530.81) will refill aciphex His updated medication list for this problem includes:    Aciphex 20 Mg Tbec (Rabeprazole sodium) ..... One tablet by mouth daily for stomach  Problem # 3:  HYPOTHYROIDISM (ICD-244.9) no meds in over 2 months. will check today, if ok will keep pt off meds Orders: T-TSH ([id number])  Problem # 4:  HERNIATED LUMBOSACRAL DISC (ICD-722.10) pt to f/u with neurosurgery for additional treatment options  Problem # 5:  NEED PROPHYLACTIC VACCINATION&INOCULATION FLU (ICD-V04.81) indication: advanced age   Problem # 6:  ARTHRITIS, RIGHT KNEE (ICD-716.96) pt is requesting diclofenac... will honor but advised pt not to take this with ibuprofen which he states he is no longer taking  Complete Medication List: 1)  Aciphex 20 Mg Tbec (Rabeprazole sodium) .... One tablet by mouth daily for stomach 2)  Diclofenac Sodium 75 Mg Tbec (Diclofenac sodium) .... One tablet by mouth two times a day 3)  Anucort-hc 25 Mg Supp (Hydrocortisone acetate) .Marland Kitchen.. 1 intrarectally two times a day as needed for hemorrhoids 4)  Tramadol Hcl 50 Mg Tabs (Tramadol hcl) .... One tablet by mouth two times a day as needed for pain 5)  Gemfibrozil 600 Mg Tabs (Gemfibrozil) .Marland Kitchen.. 1 tablet by mouth 30 minutes before meals 6)  Neurontin 300 Mg Caps (Gabapentin) .... One capsule by mouth nightly for for pain  Lipid Assessment/Plan:      Based on NCEP/ATP III, the patient's risk factor category is "2 or more risk factors and a calculated 10 year CAD risk of < 20%".  The patient's lipid goals are as follows: Total cholesterol goal is 200; LDL cholesterol goal is 100; HDL cholesterol goal is 40; Triglyceride goal is 150.    Patient Instructions: 1)  Labs due again in December 2010 2)  You have recieved the flu vaccine today 3)  Follow up with n.[email or web address],fnp in December.  Come fasting to that appointment Prescriptions: DICLOFENAC SODIUM 75 MG TBEC (DICLOFENAC SODIUM) One tablet by mouth two times a day  #60 x 3   Entered and Authorized by:   [name] FNP   Signed by:   [name] FNP on 12/27/2008   Method used:   Print then Give to Patient   RxID:   [id number] NEURONTIN 300 MG CAPS (GABAPENTIN) One capsule by mouth nightly for for pain  #30 x 1   Entered and Authorized by:   [name] FNP   Signed by:   [name] FNP on 12/27/2008   Method used:   Faxed to ...       [facility] (retail)       [address] [place], [place]  [address]       Ph: [phone]        Fax: [phone]   RxID:   [id number] ACIPHEX 20 MG TBEC (RABEPRAZOLE SODIUM) One tablet by mouth daily for stomach  #30 x 5   Entered and Authorized by:   [name] FNP   Signed by:   [name] FNP on 12/27/2008   Method used:   Faxed to ...       [facility] - [facility] (retail)       [address] [place], [place]  [address]       Ph: [phone]  Fax: [phone]   RxID:   [id number] GEMFIBROZIL 600 MG TABS (GEMFIBROZIL) 1 tablet by mouth 30 minutes before meals  #60 x 5   Entered and Authorized by:   [name] FNP   Signed by:   [name] FNP on 12/27/2008   Method used:   Faxed to ...       [facility] - Pharmac (retail)       [address] [place], [place]  [address]       Ph: [phone]       Fax: [phone]   RxID:   [id number]   Appended Document: GERD/Hypercholesterolemia   Influenza Vaccine    Vaccine Type: Fluvax 3+    Site: left deltoid    Mfr: Sanofi Pasteur    Dose: 0.5 ml    Route: IM    Given by: Vesta Mixer CMA    Exp. Date: 09/27/2009    Lot #: [id number]    VIS given: 10/22/06 version given December 28, 2008.  Flu Vaccine Consent Questions    Do you have a history of severe allergic reactions to this vaccine? no    Any prior history of allergic reactions to egg and/or gelatin? no    Do you have a sensitivity to the preservative Thimersol? no    Do you have a past history of Guillan-Barre Syndrome? no    Do you currently have an acute febrile illness? no    Have you ever had a severe reaction to latex? no    Vaccine information given and explained to patient? yes

## 2010-04-30 NOTE — Progress Notes (Signed)
Summary: NEED SPEAK WITH THE NURSE  Phone Note Call from Patient   Summary of Call: NEED TO SPEAK WITH THE NURSE PLEASE CALL PATIENT WENT TO [facility] TODAY WITH A BIG HEADACHE HE NEED A SOONER APP. HIS APP IS ON 12.09.11 AT 9:45 AM WITH MUL[employer]. PHONE # [phone] Initial call taken by: Domenic Polite,  February 06, 2010 2:44 PM  Follow-up for Phone Call        From ED notes, headache seems to be related to sleep apnea.  Notes on your desk.  Dutch Quint RN  February 06, 2010 3:40 PM   Additional Follow-up for Phone Call Additional follow up Details #1::        it appear that they think the problem is related to him not using his CPAP correctly. Has he contacted the company through which he has his CPAP (perhaps [facility]). If he has not, find out who services his machine and call to see if someone can go assess machine at pt's house. pt was also advised to appy vasoline and lubricant to nose Additional Follow-up by: [name] FNP,  February 06, 2010 3:52 PM    Additional Follow-up for Phone Call Additional follow up Details #2::    CPAP machine states [facility].  [phone].  Pt. needs to know if they will charge for the visit.  I called [facility], but they were closed for the day.  I left a message for a return call.    Pt. states that he has not been using his CPAP machine because it makes noise and the mask does not fit well.  Son was interpreting for him.  Adjusted the straps so that the mask will fit, until service can come out.  Reminded re lubricant for nose.  Dutch Quint RN  February 06, 2010 4:43 PM  They may be closed tomorrow as well for the holiday. Make a flag for yourself to call them on MONDAY regarging this pt n.martin,fnp  February 07, 2010  10:22 AM  Left message with [facility] for tech to return my call today.  Dutch Quint RN  February 07, 2010 3:04 PM  [facility] FROM [facility]  PHONE [phone] [phone]  [phone] ABOUT [name]  Left message for Toniann Fail to return call.  Spoke with pt. and grandson - states [facility] set him up.  Spoke with Raynelle Fanning from [facility] -- last time 07/27/2007 seen.  He is approved for charity, will probably get it for free.  He can be seen today at 3:30 pm at the [facility] .  To bring his machine and all equipment.  Dutch Quint RN,  February 08, 2010 10:39 AM  Need new Rx for CPAP mask and supplies sent to [facility]  Spoke with grandson and advised of the above.  Dutch Quint RN  February 08, 2010 12:00 PM   Additional Follow-up for Phone Call Additional follow up Details #3:: Details for Additional Follow-up Action Taken: Rx for CPAP in basket fax back to [facility] and confirm receipt n.[email or web address],fnp  February 08, 2010  2:10 PM  Faxed to [facility] - spoke with Raynelle Fanning who will follow-up and confirm receipt.  Has already placed the order for his equipment.  Dutch Quint RN  February 08, 2010 3:19 PM  They gave him a new mask, but "it's complicated -- can only sleep 10 minutes with the mask on".  Is still sick with fatigue.  Wants to know if he could try some medication to help him sleep until he gets adjusted.  Advised re not watching TV in bed, exercising less than two hours before bed, going to bed later than his usual so that he is more ready to sleep.  He states that he's done all of those things, but still having trouble sleeping.  Dutch Quint RN  February 11, 2010 4:35 PM  Melatonin (over the counter) supplement - take according to bottle directions n.martin,fnp  February 11, 2010  6:06 PM  Spoke with daughter re use of melatonin -- pt. states that he already tried it "for three days" -- advised that it's not a quick fix, that he has to give it some more time.  Agreed to try it again for longer.  Also having questions about breathing with the machine, given [facility]'s number to speak with one of their Respiratory Therapists.  Dutch Quint RN  February 12, 2010  3:11 PM   New/Updated Medications: * CPAP MASK AND SUPPLIES Dx: 327.23 [facility] to dispense mask and supplies for pt to use with CPAP machine Prescriptions: CPAP MASK AND SUPPLIES Dx: 045.40 [facility] to dispense mask and supplies for pt to use with CPAP machine  #1 month qs x 11   Entered and Authorized by:   [name] FNP   Signed by:   [name] FNP on 02/08/2010   Method used:   Printed then faxed to ...       [facility] (retail)       [address] [place], [place]  [address]       Ph: [phone]       Fax: [phone]   RxID:   [id number]

## 2010-04-30 NOTE — Progress Notes (Signed)
Summary: [facility] Referral   Phone Note Outgoing Call   Summary of Call: Referral made to [facility]  Pt was seen last there in January 2010 (note scanned in EMR)  He was a surgical candidate at that time however pt wanted to try conservative measures.  Now he would like to return to see Dr. Jeral Fruit for re-evaluation and since back pain is ongoing and worsening. no additional scans done by this office since last evaluation Initial call taken by: [name] FNP,  July 10, 2009 8:13 AM

## 2010-04-30 NOTE — Letter (Signed)
Summary: [facility]   Imported By: Arta Bruce 03/19/2007 09:44:58  _____________________________________________________________________  External Attachment:    Type:   Image     Comment:   External Document

## 2010-04-30 NOTE — Progress Notes (Signed)
Summary: Opth referral   Phone Note Outgoing Call   Call placed by: Julieanne Manson MD,  October 12, 2009 3:58 PM Summary of Call: Stanton Kidney and Arna Medici:  see order and letter--the pt. we discussed.  Await Dr. Vic Blackbird records and pt. to check with Medicaid as well. Initial call taken by: Julieanne Manson MD,  October 12, 2009 3:58 PM  Follow-up for Phone Call        Pt's daughter given # to [facility] @ last ov on the 15th. Still awaiting office notes from Dr.Stoneburner Follow-up by: Candi Leash,  October 16, 2009 10:05 AM  Additional Follow-up for Phone Call Additional follow up Details #1::        I LEFT A MESSAGE TO JUAN TRUJILLO  [place] IN [employer]AN[employer] TO ASK HIM ABOUT HOW CAN THEY HELP MR Bergevin WITH HIS  EYE SURGERY .Cheryll Dessert  December 12, 2009 1:38 PM * I SPOKE TO JUAN TRUJILLO FROM FINANCIAL HE TOLD ME THAT THE PT CAN QUALIFY FOR FINANCIAL ASSISTING PROGRAM BUT FIRST HE NNED TO BE SEEN FOR A SPECIALIST IN [place] AND I CALL KAREN TO MADE AN APPT [phone] TO SCHEDULE AN APPT SHE TOLD ME THAT AT THE MOMENT IS NOT NEW PATIENT APPOINTMENT TRY BACK IN COUPLE OF WEEKS AND PT IS AWARE OF THAT  Additional Follow-up by: Cheryll Dessert,  December 13, 2009 11:52 AM

## 2010-04-30 NOTE — Miscellaneous (Signed)
Summary: Rehab Report/ INITIAL SUMMARY  Rehab Report/ INITIAL SUMMARY   Imported By: Arta Bruce 02/03/2008 15:21:57  _____________________________________________________________________  External Attachment:    Type:   Image     Comment:   External Document

## 2010-04-30 NOTE — Assessment & Plan Note (Signed)
Summary: Joseph Deutscher FNP/ EYE PROBLEM/GK   Vital Signs:  Patient profile:   77 year old male Height:      65 inches Weight:      168 pounds BMI:     28.06 Temp:     97.7 degrees F oral Pulse rate:   91 / minute Pulse rhythm:   regular Resp:     18 per minute BP sitting:   129 / 85  (left arm) Cuff size:   regular  Vitals Entered By: [place] Shannon (October 12, 2009 3:04 PM) CC: pt is here for left eye because of cataract... pt wants help to pay for surgery pt needs Is Patient Diabetic? No Pain Assessment Patient in pain? no       Does patient need assistance? Functional Status Self care Ambulation Normal   CC:  pt is here for left eye because of cataract... pt wants help to pay for surgery pt needs.  History of Present Illness: Pt. did see Dr. Dagoberto Ligas for right eye corneal transplant.  He is on some eye drop for the right eye.  Left eye apparently with a cataract and needs help to get surgery for this.  Family has already checked with [employer].  No current surgical option available as no funds from [employer] [facility] [employer] and [employer] [facility] only for glasses.  Has not tried to apply for Medicaid as of yet--residence card for only 4 years, though has lived here much longer. Reportedly 20/200 vision out of left eye and worse vision on right.  Current Medications (verified): 1)  Aciphex 20 Mg Tbec (Rabeprazole Sodium) .... One Tablet By Mouth Daily For Stomach 2)  Anucort-Hc 25 Mg  Supp (Hydrocortisone Acetate) .Marland Kitchen.. 1 Intrarectally Two Times A Day As Needed For Hemorrhoids 3)  Tramadol Hcl 50 Mg Tabs (Tramadol Hcl) .... One Tablet By Mouth Two Times A Day As Needed For Pain 4)  Gemfibrozil 600 Mg Tabs (Gemfibrozil) .Marland Kitchen.. 1 Tablet By Mouth 30 Minutes Before Meals 5)  Neurontin 300 Mg Caps (Gabapentin) .... One Capsule By Mouth Nightly For For Pain 6)  Levothyroxine Sodium 75 Mcg Tabs (Levothyroxine Sodium) .... One Tablet By Mouth Daily For Thyroid 7)  Tramadol  Hcl 50 Mg Tabs (Tramadol Hcl) .... One Tablet By Mouth Two Times A Day As Needed For Pain 8)  Ibuprofen 800 Mg Tabs (Ibuprofen) .... One Tablet By Mouth Three Times A Day As Needed For Pain  Allergies (verified): No Known Drug Allergies  Physical Exam  Eyes:  Appears to have a fairly dense cataract at inferior portion of lens, with smaller one above   Impression & Recommendations:  Problem # 1:  CATARACT, LEFT EYE (ICD-366.9)  Orders: Ophthalmology Referral (Ophthalmology)  Complete Medication List: 1)  Aciphex 20 Mg Tbec (Rabeprazole sodium) .... One tablet by mouth daily for stomach 2)  Anucort-hc 25 Mg Supp (Hydrocortisone acetate) .Marland Kitchen.. 1 intrarectally two times a day as needed for hemorrhoids 3)  Tramadol Hcl 50 Mg Tabs (Tramadol hcl) .... One tablet by mouth two times a day as needed for pain 4)  Gemfibrozil 600 Mg Tabs (Gemfibrozil) .Marland Kitchen.. 1 tablet by mouth 30 minutes before meals 5)  Neurontin 300 Mg Caps (Gabapentin) .... One capsule by mouth nightly for for pain 6)  Levothyroxine Sodium 75 Mcg Tabs (Levothyroxine sodium) .... One tablet by mouth daily for thyroid 7)  Tramadol Hcl 50 Mg Tabs (Tramadol hcl) .... One tablet by mouth two times a day as  needed for pain 8)  Ibuprofen 800 Mg Tabs (Ibuprofen) .... One tablet by mouth three times a day as needed for pain

## 2010-04-30 NOTE — Consult Note (Signed)
Summary: [facility]   Imported By: Arta Bruce 04/24/2008 15:40:07  _____________________________________________________________________  External Attachment:    Type:   Image     Comment:   External Document

## 2010-04-30 NOTE — Assessment & Plan Note (Signed)
Summary: Back pain   Vital Signs:  Patient profile:   77 year old male Weight:      164 pounds BMI:     27.39 Temp:     97.5 degrees F Pulse rate:   96 / minute Pulse rhythm:   regular Resp:     22 per minute BP sitting:   105 / 70  (left arm) Cuff size:   regular  Vitals Entered By: Vesta Mixer CMA (July 09, 2009 2:48 PM) CC: rt hip and waist pain for many years, but worse now.  No med bottles for diclofenac or tramadol, Back Pain Is Patient Diabetic? No Pain Assessment Patient in pain? yes     Location: hip/waist Intensity: 4  Does patient need assistance? Ambulation Normal   CC:  rt hip and waist pain for many years, but worse now.  No med bottles for diclofenac or tramadol, and Back Pain.  History of Present Illness: Pt into the office with complaints of back pain. Previous history of back pain for which he was seen by the neurosurgeon. He was seen last by Dr. Phineas Semen April 10, 2009 (full visit details in EMR) Pt was a surgical candidate however pt was going back to [place] and wanted to explore his medical options there. He then went to [place] and received some treatments - spinal injections.  the pain was improved for about 6 months then gradually started to return.  At this time pain is at all time high.  Pt presents today with his medications. He describes how he is taking the medications - ibuprofen, neurontin Was order tramadol previously but did not like to take it   Back Pain History:      The pain is located in the lower back region and does radiate below the knees.  He states this is not work related.  He states that he has had a prior history of back pain.  The patient has not had any recent physical therapy for his back pain.        Description of injury in patient's own words:  for the past 3 days pain has been very bad despite taking the medications - ibuprofen and neurontin.    Critical Exclusionary Diagnosis Criteria (CEDC) for Back Pain:   The patient denies a history of previous trauma.  He has no prior history of spinal surgery.  There are no symptoms to suggest infection, cauda equina, or psychosocial factors for back pain.  Cancer risk factors include age >50 yrs with new back pain.  Other positive CEDC factors include low back pain worse with activity.     Allergies (verified): No Known Drug Allergies  Review of Systems General:  Complains of sleep disorder; due to pain. GU:  Denies incontinence. MS:  Complains of joint pain and low back pain. Neuro:  Complains of numbness and tingling; right leg.  Physical Exam  General:  alert.  uncomfortable Head:  normocephalic.   Msk:  unable to get up to the exam table pacing back and forth in exam room Neurologic:  alert & oriented X3.   Psych:  Oriented X3.    Low Back Pain Physical Exam:    Inspection-deformity:     No    Palpation-spinal tenderness:   Yes       Location:   L3-L4   Impression & Recommendations:  Problem # 1:  HERNIATED LUMBOSACRAL DISC (ICD-722.10)  will refer back to Dr. Phineas Semen as he has been seen there before  and he was deemed at that time a surgical candidate will give depomedrol 40mg  and toradol 30mg  IM in office Orders: Neurosurgeon Referral (Neurosurgeon)  Problem # 2:  HYPOTHYROIDISM (ICD-244.9) recent labs ok pt to continue current meds His updated medication list for this problem includes:    Levothyroxine Sodium 75 Mcg Tabs (Levothyroxine sodium) ..... One tablet by mouth daily for thyroid  Complete Medication List: 1)  Aciphex 20 Mg Tbec (Rabeprazole sodium) .... One tablet by mouth daily for stomach 2)  Anucort-hc 25 Mg Supp (Hydrocortisone acetate) .Marland Kitchen.. 1 intrarectally two times a day as needed for hemorrhoids 3)  Tramadol Hcl 50 Mg Tabs (Tramadol hcl) .... One tablet by mouth two times a day as needed for pain 4)  Gemfibrozil 600 Mg Tabs (Gemfibrozil) .Marland Kitchen.. 1 tablet by mouth 30 minutes before meals 5)  Neurontin 300 Mg Caps  (Gabapentin) .... One capsule by mouth nightly for for pain 6)  Levothyroxine Sodium 75 Mcg Tabs (Levothyroxine sodium) .... One tablet by mouth daily for thyroid 7)  Tramadol Hcl 50 Mg Tabs (Tramadol hcl) .... One tablet by mouth two times a day as needed for pain 8)  Ibuprofen 800 Mg Tabs (Ibuprofen) .... One tablet by mouth three times a day as needed for pain  Patient Instructions: 1)  You will be referred back to Dr. Phineas Semen at [facility].  You will be notified of the time/date of the appointment. 2)  Your thyroid lab was ok.  continue current medication 3)  Follow up as needed Prescriptions: NEURONTIN 300 MG CAPS (GABAPENTIN) One capsule by mouth nightly for for pain  #30 x 3   Entered and Authorized by:   [name] FNP   Signed by:   [name] FNP on 07/09/2009   Method used:   Faxed to ...       [facility] (retail)       [address] [place], [place]  [address]       Ph: [phone]       Fax: [phone]   RxID:   [id number]   Appended Document: Back pain   Medication Administration  Injection # 1:    Medication: Ketorolac-Toradol 15mg     Diagnosis: HERNIATED LUMBOSACRAL DISC (ICD-722.10)    Route: IM    Site: R thigh    Exp Date: 06/29/2010    Lot #: [id number]    Mfr: Perrin Maltese    Comments: [phone]    Patient tolerated injection without complications    Given by: Chauncy Passy SMA  Injection # 2:    Medication: Depo- Medrol 40mg     Diagnosis: HERNIATED LUMBOSACRAL DISC (ICD-722.10)    Route: IM    Site: L deltoid    Exp Date: 06/29/2011    Lot #: [id number]    Mfr: Pharmacia    Comments: [phone]    Patient tolerated injection without complications    Given by: Chauncy Passy SMA  Orders Added: 1)  Ketorolac-Toradol 15mg  [J1885] 2)  Depo- Medrol 40mg  [J1030] 3)  Admin of Therapeutic Inj  intramuscular or subcutaneous [59563]

## 2010-04-30 NOTE — Assessment & Plan Note (Signed)
Summary: *PT FNP MARTIN RIGHT EYE IRRITATED / NS   Vital Signs:  Patient profile:   77 year old male Weight:      165 pounds Temp:     97.5 degrees F Pulse rate:   67 / minute Pulse rhythm:   regular Resp:     18 per minute BP sitting:   138 / 87  (left arm) Cuff size:   regular  Vitals Entered By: Vesta Mixer CMA (September 17, 2009 2:10 PM) CC: right eye irritation.  Had recent cornea operation. Is Patient Diabetic? No Pain Assessment Patient in pain? no       Does patient need assistance? Ambulation Normal   CC:  right eye irritation.  Had recent cornea operation.Marland Kitchen  History of Present Illness: 3 years ago--corneal transplantation.  Pt. feels the stitches from the surgery are coming out --states he awakened this morning with pain and irritation--cannot open eye because of discomfort.  Having tearing, but no other discharge.  Has had 5 other instances where he developed same pain and a suture needed to be removed.  Allergies: No Known Drug Allergies  Physical Exam  Eyes:  right eye with clear tearing.  No significant injection of conjunctivae.  Not able to see obvious suture with Ophthalmoscope.  Clear mucous about corneal edge.  Cornea appears fairly clear.   Impression & Recommendations:  Problem # 1:  EYE PAIN, RIGHT (ICD-379.91) Spoke with Dr. Dagoberto Ligas at [facility]. She can see pt. today if he can get to office by 5 p.m.   She has seen him before for this.  Complete Medication List: 1)  Aciphex 20 Mg Tbec (Rabeprazole sodium) .... One tablet by mouth daily for stomach 2)  Anucort-hc 25 Mg Supp (Hydrocortisone acetate) .Marland Kitchen.. 1 intrarectally two times a day as needed for hemorrhoids 3)  Tramadol Hcl 50 Mg Tabs (Tramadol hcl) .... One tablet by mouth two times a day as needed for pain 4)  Gemfibrozil 600 Mg Tabs (Gemfibrozil) .Marland Kitchen.. 1 tablet by mouth 30 minutes before meals 5)  Neurontin 300 Mg Caps (Gabapentin) .... One capsule by mouth  nightly for for pain 6)  Levothyroxine Sodium 75 Mcg Tabs (Levothyroxine sodium) .... One tablet by mouth daily for thyroid 7)  Tramadol Hcl 50 Mg Tabs (Tramadol hcl) .... One tablet by mouth two times a day as needed for pain 8)  Ibuprofen 800 Mg Tabs (Ibuprofen) .... One tablet by mouth three times a day as needed for pain  Patient Instructions: 1)  Dr. Mckinley Jewel 2)  [facility] 3)  [address] 4)  [place] 5)  Phone:  [phone]

## 2010-04-30 NOTE — Letter (Signed)
Summary: *HSN Results Follow up  [facility]  [address] [place], [place] [address]   Phone: [phone]  Fax: [phone]      02/09/2007   [name] [address] [place], [place]  [address]   Dear  [name],                            ____S.Drinkard,FNP   ____D. Gore,FNP       ____B. McPherson,MD   ____V. Rankins,MD    ____E. Mulberry,MD    _X___N. Daphine Deutscher, FNP  ____D. Reche Dixon, MD    ____K. Philipp Deputy, MD    ____Other     This letter is to inform you that your recent test(s):  _______Pap Smear    __X_____Lab Test     _______X-ray    _______ is within acceptable limits  _______ requires a medication change  _______ requires a follow-up lab visit  _______ requires a follow-up visit with your provider   Comments:  Cholesterol labs were still slightly elevated. Continue Tricor 145mg  daily.  Need to maintain and low fat diet.      _________________________________________________________ If you have any questions, please contact our office [phone].                    Sincerely,   [name] FNP [facility]

## 2010-04-30 NOTE — Assessment & Plan Note (Signed)
Summary: Back pain/Right heel pain   Vital Signs:  Patient Profile:   77 Years Old Male Weight:      172 pounds Temp:     96.7 degrees F Pulse rate:   76 / minute Pulse rhythm:   regular Resp:     16 per minute BP sitting:   122 / 80  (left arm) Cuff size:   regular  Pt. in pain?   no  Vitals Entered By: Vesta Mixer CMA (January 27, 2007 11:14 AM)              Is Patient Diabetic? No  Does patient need assistance? Ambulation Normal     Chief Complaint:  lumbar pain and right foot pain x 1 month, can't walk alot, has been eating less, and but his weight is not going down.  History of Present Illness:  Pt into the office for back pain.  he reports that he had surgery on the area about 20 years ago.  he notes that it is unbearable at this time.  he is hardly able to sit and and is not able to cross his legs.  pt notes that he had x-ray about 2 years ago and calcifications were noted.  Left foot pain - only in the heel.  notes pain has been for the past month.  he has dress shoes on today but states that he does have some tennis shoes but it does not help the pain.  He notes that he feels something sharp that is making him feel pain.  very little english - needs interpreter     Current Allergies: No known allergies      Review of Systems  General      Denies chills, fatigue, and fever.  Eyes      Denies blurring, discharge, double vision, eye irritation, eye pain, halos, itching, light sensitivity, red eye, vision loss-1 eye, and vision loss-both eyes.  ENT      Denies ear discharge, nasal congestion, and sore throat.  CV      Denies chest pain or discomfort, fatigue, and palpitations.  Resp      Denies chest discomfort, cough, and shortness of breath.  GI      Denies abdominal pain, diarrhea, nausea, and vomiting.  GU      Denies decreased libido, discharge, dysuria, erectile dysfunction, genital sores, hematuria, incontinence, nocturia, urinary  frequency, and urinary hesitancy.  MS      Complains of low back pain.      right heel pain   Physical Exam  General:     alert and well-nourished.   Head:     normocephalic.   Eyes:     pupils round.   Nose:     no external deformity.   Neck:     supple.   Lungs:     normal respiratory effort, normal breath sounds, no crackles, and no wheezes.   Heart:     normal rate, regular rhythm, no murmur, no gallop, and no rub.   Abdomen:     soft, non-tender, and normal bowel sounds.  obese Msk:     right heel, tenderness with palpation tenderness with palpation of lumbar paraspinal muscles  Extremities:     no edema Neurologic:      alert & oriented X3 and abnormal gait - slow and steady    Impression & Recommendations:  Problem # 1:  BACK PAIN, LUMBAR (ICD-724.2) previous surgery to lumbar region.  prednisone taper.  His updated medication list for this problem includes:    Ibuprofen 800 Mg Tabs (Ibuprofen) ..... One by mouth every 8 hour prn    Naprosyn 500 Mg Tabs (Naproxen) .Marland Kitchen... 1 tablet by mouth two times a day x 3 days then as needed  Orders: Radiology other (Radiology Other)   Problem # 2:  HYPERLIPIDEMIA (ICD-272.4) Pt is not fasting.  he will need to return next week for fasting lipids. His updated medication list for this problem includes:    Tricor 145 Mg Tabs (Fenofibrate) .Marland Kitchen... 1 tablet by mouth daily for cholesterol  Orders: T-General Health Panel (CBCD, CMP, TSH) ([id number])   Problem # 3:  HYPOTHYROIDISM (ICD-244.9) Will check tsh today. His updated medication list for this problem includes:    Synthroid 50 Mcg Tabs (Levothyroxine sodium) ..... One by mouth once daily  Orders: T-General Health Panel (CBCD, CMP, TSH) ([id number])   Problem # 4:  HEEL PAIN, RIGHT (ICD-729.5) wear supportive shoes.   anti-inflammatories will help.  Complete Medication List: 1)  Synthroid 50 Mcg Tabs (Levothyroxine sodium) .... One by mouth once daily  2)  Protonix 40 Mg Tbec (Pantoprazole sodium) .... Once daily 3)  Ibuprofen 800 Mg Tabs (Ibuprofen) .... One by mouth every 8 hour prn 4)  Tricor 145 Mg Tabs (Fenofibrate) .Marland Kitchen.. 1 tablet by mouth daily for cholesterol 5)  Prednisone 10 Mg Tabs (Prednisone) .... Taper from 50mg  to 10mg  over the next 5 days 6)  Naprosyn 500 Mg Tabs (Naproxen) .Marland Kitchen.. 1 tablet by mouth two times a day x 3 days then as needed   Patient Instructions: 1)  Prednsone 10mg  2)  Thurday - 50mg  (5 tabs) 3)  Friday - 40mg  (4 tabs) 4)  Saturday - 30mg  (3 tabs) 5)  Sunday - 20mg  (2 tabs) 6)  Monday - 10mg  (1 tabs) 7)  naprosyn 500mg   8)  1 tablet by mouth two times a day x 3 days then as needed  9)  **Take with food** Do not take with ibuprofen 10)  Follow up in 1 month for x-ray results. You may get x-ray done at any time.  Remember to take the slip with you.    Prescriptions: NAPROSYN 500 MG  TABS (NAPROXEN) 1 tablet by mouth two times a day x 3 days then as needed  #20 x 0   Entered and Authorized by:   [name] FNP   Signed by:   [name] FNP on 01/27/2007   Method used:   Print then Give to Patient   RxID:   [id number] PREDNISONE 10 MG  TABS (PREDNISONE) taper from 50mg  to 10mg  over the next 5 days  #15 x 0   Entered and Authorized by:   [name] FNP   Signed by:   [name] FNP on 01/27/2007   Method used:   Print then Give to Patient   RxID:   [id number] PROTONIX 40 MG  TBEC (PANTOPRAZOLE SODIUM) once daily  #90 x 3   Entered by:   Vesta Mixer CMA   Authorized by:   [name] FNP   Signed by:   Vesta Mixer CMA on 01/27/2007   Method used:   Print then Give to Patient   RxID:   [id number]  ]  Appended Document: Back pain/Right heel pain   Medication Administration  Injection # 1:    Medication: Depo- Medrol 80mg     Diagnosis: BACK PAIN, LUMBAR (ICD-724.2)    Route: IM    Site: RUOQ gluteus  Exp Date: 03/31/2009    Lot #: [id number]    Mfr:  Pharmacia    Comments: 40mg  x 2 given    Patient tolerated injection without complications    Given by: Vesta Mixer CMA (January 27, 2007 2:24 PM)  Injection # 2:    Medication: Ketorolac-Toradol 15mg     Diagnosis: BACK PAIN, LUMBAR (ICD-724.2)    Route: IM    Site: LUOQ gluteus    Exp Date: 07/30/2007    Lot #: [id number]    Mfr: [email or web address]    Comments: 30mg  x 2 given    Patient tolerated injection without complications    Given by: Vesta Mixer CMA (January 27, 2007 2:24 PM)  Orders Added: 1)  Admin of Therapeutic Inj  intramuscular or subcutaneous [90772] 2)  Ketorolac-Toradol 15mg  [J1885] 3)  Depo- Medrol 80mg  [J1040]

## 2010-05-06 ENCOUNTER — Encounter (INDEPENDENT_AMBULATORY_CARE_PROVIDER_SITE_OTHER): Payer: Self-pay | Admitting: Nurse Practitioner

## 2010-05-06 LAB — CONVERTED CEMR LAB
ALT: 39 units/L (ref 0–53)
AST: 37 units/L (ref 0–37)
Albumin: 4.6 g/dL (ref 3.5–5.2)
Alkaline Phosphatase: 65 units/L (ref 39–117)
BUN: 19 mg/dL (ref 6–23)
Basophils Absolute: 0 10*3/uL (ref 0.0–0.1)
Basophils Relative: 1 % (ref 0–1)
CO2: 25 meq/L (ref 19–32)
Calcium: 10.1 mg/dL (ref 8.4–10.5)
Chloride: 103 meq/L (ref 96–112)
Cholesterol: 184 mg/dL (ref 0–200)
Creatinine, Ser: 1.03 mg/dL (ref 0.40–1.50)
Eosinophils Absolute: 0.1 10*3/uL (ref 0.0–0.7)
Eosinophils Relative: 2 % (ref 0–5)
Glucose, Bld: 86 mg/dL (ref 70–99)
HCT: 50.9 % (ref 39.0–52.0)
HDL: 34 mg/dL — ABNORMAL LOW (ref >39)
Hemoglobin: 17 g/dL (ref 13.0–17.0)
LDL Cholesterol: 104 mg/dL — ABNORMAL HIGH (ref 0–99)
Lymphocytes Relative: 27 % (ref 12–46)
Lymphs Abs: 1.4 10*3/uL (ref 0.7–4.0)
MCHC: 33.4 g/dL (ref 30.0–36.0)
MCV: 94.3 fL (ref 78.0–100.0)
Monocytes Absolute: 0.4 10*3/uL (ref 0.1–1.0)
Monocytes Relative: 7 % (ref 3–12)
Neutro Abs: 3.4 10*3/uL (ref 1.7–7.7)
Neutrophils Relative %: 63 % (ref 43–77)
PSA: 0.71 ng/mL (ref <=4.00)
Platelets: 175 10*3/uL (ref 150–400)
Potassium: 4.5 meq/L (ref 3.5–5.3)
RBC: 5.4 M/uL (ref 4.22–5.81)
RDW: 14.1 % (ref 11.5–15.5)
Sodium: 139 meq/L (ref 135–145)
Total Bilirubin: 0.9 mg/dL (ref 0.3–1.2)
Total CHOL/HDL Ratio: 5.4
Total Protein: 7.4 g/dL (ref 6.0–8.3)
Triglycerides: 228 mg/dL — ABNORMAL HIGH (ref <150)
VLDL: 46 mg/dL — ABNORMAL HIGH (ref 0–40)
WBC: 5.3 10*3/uL (ref 4.0–10.5)

## 2010-05-07 ENCOUNTER — Encounter (INDEPENDENT_AMBULATORY_CARE_PROVIDER_SITE_OTHER): Payer: Self-pay | Admitting: Nurse Practitioner

## 2010-05-08 NOTE — Progress Notes (Signed)
Summary: Referral to check hearing  Phone Note Outgoing Call   Summary of Call: pt needs referral to audiologist to check hearing he likely needs hearing aids Initial call taken by: [name] FNP,  April 30, 2010 1:54 PM  Follow-up for Phone Call        SEND THE REFERRAL TO [facility] THEY REF TO [facility] PH # [phone] [address]ESS [address]  WAITING FOR AN APPT.Marland KitchenCheryll Dessert  April 30, 2010 4:38 PM

## 2010-05-08 NOTE — Assessment & Plan Note (Signed)
Summary: Hearing Loss   Vital Signs:  Patient profile:   77 year old male Weight:      170.1 pounds BMI:     28.41 Temp:     97.1 degrees F oral Pulse rate:   84 / minute Pulse rhythm:   regular Resp:     20 per minute BP sitting:   120 / 90  (left arm) Cuff size:   regular  Vitals Entered By: Levon Hedger (April 30, 2010 10:51 AM)  Nutrition Counseling: Patient's BMI is greater than 25 and therefore counseled on weight management options. CC: needs to be referred to an ear specialist...having hard time hearing Is Patient Diabetic? No Pain Assessment Patient in pain? no       Does patient need assistance? Functional Status Self care Ambulation Normal  Hearing Screen  20db HL: Left  500 hz: No Response 1000 hz: No Response 2000 hz: No Response 4000 hz: No Response Right  500 hz: No Response 1000 hz: No Response 2000 hz: No Response 4000 hz: No Response   Hearing Testing Entered By: Levon Hedger (April 30, 2010 11:45 AM)   CC:  needs to be referred to an ear specialist...having hard time hearing.  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 Happening over an extended period of time. R>L It has never bothered pt much until recently when he started English Class and he is not able to hear what the people say in class.  He finds himself having to sit very close to the front and reading lips to try and make out what the people in class are saying Pt would like to go to a specialist and have his hearing tested.  Optho - pt went after his last visit here.  He needs a corneal transplant but he is not able to afford.  Allergies (verified): No Known Drug Allergies  Review of Systems Eyes:  Complains of vision loss-both eyes. ENT:  Complains of decreased hearing. CV:  Denies chest pain or discomfort. Resp:  Denies cough. GI:  Denies abdominal pain, nausea, and vomiting.  Physical Exam  General:  alert.   Head:   normocephalic.   Eyes:  pupils round.   Ears:  bil TM visble- minimal cerumen Neurologic:  alert & oriented X3.  alert & oriented X3.   Skin:  color normal.  color normal.   Psych:  Oriented X3.  Oriented X3.     Impression & Recommendations:  Problem # 1:  UNSPECIFIED HEARING LOSS (ICD-389.9) will refer to hearing test he will likely need hearing aids Orders: Misc. Referral (Misc. Ref) Hearing Screening (40086)  Complete Medication List: 1)  Aciphex 20 Mg Tbec (Rabeprazole sodium) .... One tablet by mouth daily for stomach 2)  Anucort-hc 25 Mg Supp (Hydrocortisone acetate) .Marland Kitchen.. 1 intrarectally two times a day as needed for hemorrhoids 3)  Tramadol Hcl 50 Mg Tabs (Tramadol hcl) .... One tablet by mouth two times a day as needed for pain 4)  Gemfibrozil 600 Mg Tabs (Gemfibrozil) .Marland Kitchen.. 1 tablet by mouth 30 minutes before meals 5)  Neurontin 300 Mg Caps (Gabapentin) .... One capsule by mouth nightly for for pain 6)  Levothyroxine Sodium 75 Mcg Tabs (Levothyroxine sodium) .... One tablet by mouth daily for thyroid 7)  Tramadol Hcl 50 Mg Tabs (Tramadol hcl) .... One tablet by mouth two times a day as needed for pain 8)  Ibuprofen 800 Mg Tabs (Ibuprofen) .... One tablet by mouth three  times a day as needed for pain 9)  Cpap Mask and Supplies  .... Dx: [facility] to dispense mask and supplies for pt to use with cpap machine  Patient Instructions: 1)  Schedule an appointment for a fasting lab visit - No food after midnight before this visit.  you will need lipids, cmet, psa and cbc. 2)  You will be referred to a doctor to check your hearing.  You will be notified of the time/date of this appointment   Orders Added: 1)  Est. Patient Level III [16109] 2)  Misc. Referral [Misc. Ref] 3)  Hearing Screening [phone]

## 2010-05-16 NOTE — Letter (Signed)
Summary: Lipid Letter  [facility]  [address] [place], [place] [address]   Phone: [phone]  Fax: [phone]    05/07/2010  [facility] [address] [place], [place]  [address]  Dear Irving Shows:  We have carefully reviewed your last lipid profile from 05/06/2010 and the results are noted below with a summary of recommendations for lipid management.    Cholesterol:       184     Goal: less than 200   HDL "good" Cholesterol:  34    Goal: greater than 40   LDL "bad" Cholesterol:  104    Goal: less than 100   Triglycerides:      228    Goal: less than 150    Cholesterol labs are shown above.    Keep taking your cholesterol medications as ordered.    Current Medications: 1)    Aciphex 20 Mg Tbec (Rabeprazole sodium) .... One tablet by mouth daily for stomach 2)    Anucort-hc 25 Mg  Supp (Hydrocortisone acetate) .Marland Kitchen.. 1 intrarectally two times a day as needed for hemorrhoids 3)    Tramadol Hcl 50 Mg Tabs (Tramadol hcl) .... One tablet by mouth two times a day as needed for pain 4)    Gemfibrozil 600 Mg Tabs (Gemfibrozil) .Marland Kitchen.. 1 tablet by mouth 30 minutes before meals 5)    Neurontin 300 Mg Caps (Gabapentin) .... One capsule by mouth nightly for for pain 6)    Levothyroxine Sodium 75 Mcg Tabs (Levothyroxine sodium) .... One tablet by mouth daily for thyroid 7)    Tramadol Hcl 50 Mg Tabs (Tramadol hcl) .... One tablet by mouth two times a day as needed for pain 8)    Ibuprofen 800 Mg Tabs (Ibuprofen) .... One tablet by mouth three times a day as needed for pain 9)    Cpap Mask and Supplies  .... Dx: 327.[facility] to dispense mask and supplies for pt to use with cpap machine  If you have any questions, please call. We appreciate being able to work with you.   Sincerely,    [facility] [name] FNP

## 2010-05-22 ENCOUNTER — Encounter (INDEPENDENT_AMBULATORY_CARE_PROVIDER_SITE_OTHER): Payer: Self-pay | Admitting: Nurse Practitioner

## 2010-05-22 ENCOUNTER — Telehealth (INDEPENDENT_AMBULATORY_CARE_PROVIDER_SITE_OTHER): Payer: Self-pay | Admitting: Nurse Practitioner

## 2010-05-28 NOTE — Progress Notes (Signed)
Summary: Hearing test  Phone Note Outgoing Call   Summary of Call: may need assistance from Arna Medici I received his hearing evaluation and see they they have provided him informaion with [facility] [employer]  of which he is going to proceed.  pt should continue to see if he qualifies. There is also a recommendation for pt to see ENT for possible dysfunction of tube.  This is going to be a co-payment.  i would suggest that pt proceed with trying to get the hearing aid and if he still has problems afterward then i can refer to ENT.  i want him to spend money wisely and where absolutely necessary Initial call taken by: [name] FNP,  May 22, 2010 12:47 PM  Follow-up for Phone Call        per Arna Medici left message for pt to return call to the office. Levon Hedger  May 24, 2010 2:33 PM  Levon Hedger  May 24, 2010 3:07 PM pt informed of above with Nora's assistance.

## 2010-05-28 NOTE — Letter (Signed)
Summary: [facility]   Imported By: Arta Bruce 05/23/2010 15:12:14  _____________________________________________________________________  External Attachment:    Type:   Image     Comment:   External Document

## 2010-06-01 ENCOUNTER — Encounter (INDEPENDENT_AMBULATORY_CARE_PROVIDER_SITE_OTHER): Payer: Self-pay | Admitting: Nurse Practitioner

## 2010-06-05 ENCOUNTER — Encounter (INDEPENDENT_AMBULATORY_CARE_PROVIDER_SITE_OTHER): Payer: Self-pay | Admitting: Internal Medicine

## 2010-06-11 NOTE — Letter (Signed)
Summary: RECORDS FROM [facility]  RECORDS FROM [facility]   Imported By: Arta Bruce 06/05/2010 11:17:56  _____________________________________________________________________  External Attachment:    Type:   Image     Comment:   External Document

## 2010-06-11 NOTE — Miscellaneous (Signed)
Summary: [facility]  Clinical Lists Changes  Medications: Added new medication of TIMOLOL MALEATE 0.25 % SOLN (TIMOLOL MALEATE) Unknown dosing--noted should be taking in [facility] referral notes. ?Dr. Dagoberto Ligas Rx? Added new medication of MURO 128 2 % SOLN (SODIUM CHLORIDE (HYPERTONIC)) 1 drop both eyes 4 times daily--ophthalmology  Pt. seen at [facility], Dr. Matthew Saras. Recommended removal of cataract on left for best option of visual improvement, though increased risk with failure of corneal transplant on right.  May consider repeat right corneal translplant as well. Pt. interested in surgery--to meet with financial counselor regarding both surgery options. RTC in 8 weeks for repeat evaluation. To protect eyes with glasses. IOP noted elevated in right eye--to use his Timolol as prescribed by other ophthalmologist

## 2010-08-16 NOTE — Procedures (Signed)
NAMEDEMETRE, [name]               ACCOUNT NO.:  [id number]   MEDICAL RECORD NO.:  [id number]          PATIENT TYPE:  OUT   LOCATION:  SLEEP CENTER                 FACILITY:  [facility]   PHYSICIAN:  Clinton D. Maple Hudson, M.D. DATE OF BIRTH:  10-21-1932   DATE OF STUDY:  06/18/2004                              NOCTURNAL POLYSOMNOGRAM   REFERRING PHYSICIAN:  Dr. Donia Guiles   INDICATION FOR STUDY:  Hypersomnia with sleep apnea.   EPWORTH SLEEPINESS SCORE:  16/24   BMI:  26   WEIGHT:  170 pounds   The patient speaks very limited [place].  His son was with him at the  beginning of the night to help the study get started and serve as  translator, but was unable to stay the whole night.   SLEEP ARCHITECTURE:  Total sleep time 337 minutes with sleep efficiency 74%.  Stage I was 4%, Stage II 63%, Stages III and IV were 11%, REM was 23% of  total sleep time.  Awake after sleep latency 3 minutes.  REM latency 55  minutes.  Awake after sleep onset 115 minutes.  Arousal index 17.  No  medications were reported.   RESPIRATORY DATA:  Split study protocol.  Respiratory disturbance index  (RDI) 19.6 obstructive events per hour, indicating mild to moderate  obstructive sleep apnea/hypopnea before CPAP.  This included three  obstructive apneas and 42 hypopneas before CPAP.  Most sleep and most events  were recorded while on his left side.  REM RDI 15.  CPAP was titrated to 11  CWP,  RDI 0/hr.  Specific mask style was not recorded by the technician.   OXYGEN DATA:  Moderate snoring with oxygen desaturation to a nadir of 75%  before CPAP.  After CPAP titration, oxygenation held 95-98% on room air.   CARDIAC DATA:  Normal sinus rhythm.   MOVEMENT/PARASOMNIA:  Occasional leg jerk with insignificant effect on  sleep.   IMPRESSION/RECOMMENDATION:  1.  Mild to moderate obstructive sleep apnea/hypopnea syndrome.  RDI 19.6/hr      with moderate snoring and oxygen desaturation, which is 75%.  2.   Successful CPAP titration to 11 CWP, RDI 0/hr with normal oxygenation.      The technician did not record the style of CPAP mask used, but this can      be established by the home care company working with the patient.      CDY/MEDQ  D:  06/23/2004 11:09:17  T:  06/24/2004 07:40:13  Job:  [id number]

## 2010-08-16 NOTE — Op Note (Signed)
[name], [name] NO.:  [id number]   MEDICAL RECORD NO.:  [id number]                   PATIENT TYPE:  OIB   LOCATION:  2899                                 FACILITY:  [facility]   PHYSICIAN:  Blake Divine., M.D.              DATE OF BIRTH:  Dec 11, 1932   DATE OF PROCEDURE:  08/02/2002  DATE OF DISCHARGE:  07/28/2002                                 OPERATIVE REPORT   PREOPERATIVE DIAGNOSIS:  Visually significant cataract, right eye.   POSTOPERATIVE DIAGNOSIS:  Visually significant cataract, right eye.   OPERATION PERFORMED:  Phacoemulsification with intraocular lens implant with  anterior vitrectomy.   SURGEON:  [name], M.D.   ANESTHESIA:  2% Xylocaine in a 50/50 mixture of 0.75% Marcaine.   COMPLICATIONS:  Vitreous loss.   INDICATIONS FOR PROCEDURE:  Cataract interfering with the patient's daily  lifestyle and ability to perform daily tasks.   DESCRIPTION OF PROCEDURE:  The patient was taken to the operating room where  the eye for surgery was identified.  The patient was given a peribulbar  block with the aforementioned anesthetic agent.  After achieving adequate  akinesia and anesthesia, the patient's face was prepped and draped in the  usual sterile fashion.  Following this, a wire lid speculum was inserted and  the operating microscope was positioned temporally.  It was noted that the  eye was adequately dilated.  The nucleus, however, was 3 to 4+ nuclear  sclerotic cataract.  Using a Weck-cell sponge to fixate the eye, the super  sharp blade was entered the eye at the 12 o'clock position through clear  cornea and the Viscoelastic was injected in the eye Viscoat.  Following  this, the 2.75 mm keratome was used in a stepwise fashion temporally to  enter the anterior chamber.  Additional Viscoat was injected in the eye. A  bent 25 gauge needle was then used to engage the anterior capsule and tear  the capsule forward.  The Utrata  capsular forceps were then used to grasp  the capsule and use a continuous tear capsulorrhexis and the anterior  capsule was removed from the eye.  Balanced salt solution was used to  hydrodissect the nucleus.  The nucleus was dislocated and known to  __________ easily in the bag.  Following this, the phacoemulsification unit  was placed in the eye and the phaco unit had been inadvertently placed on  position 2.  Therefore, there was too much vacuum at 300 present.  Therefore, the phaco handpiece was removed from the eye.  It was positioned  on the positioning want of the phaco which allowed for only aspiration of  100 at 40% power was placed in the eye and the nucleus was then divided into  four quadrants.  The Kuglen hook was then used to crack the nucleus into  segments.  The phaco unit was then placed on position two  with a vacuum of  300.  A small chunk of nucleus was removed from the eye.  The remaining  nuclear particles remained very adherent in the eye and were rotated and  spinned and then finally the nuclear material was prolapsed into the  anterior chamber and phacoemulsified.  The phacoemulsification, the  aspiration of the nucleus was very difficult during the phacoemulsification.  Therefore the phaco tip was removed from the eye when there was a small  remaining nuclear segment present.  At was noted at this point that there  was an opening in the posterior capsule.  Therefore, a bent tip needle on  irrigation was then used to stabilize the nuclear fragment and the vitrector  was used to fragment the nuclear particle and also perform an anterior  vitrectomy.  The vitrectomy was performed until there was no vitreous  present in the anterior chamber and all the nuclear remnants were removed.  Following this, the wound was checked for vitreous and none was noted at  that point. Following this, the unit was removed from the  eye and the  anterior chamber lens was taken from the  package and inspected.  It was  necessary to open the incision to 4.5 mm to accommodate the anterior chamber  lens.  The lens was then placed after Miochol was injected in the eye and  the pupil was seen to come down.  The lens was then placed in the angle.  The superior haptic or trailing haptics were then placed.  It was noted that  there was a bit of iris captured.  A Kuglen hook was used to elevate the  optic of the lens anterior to the iris.  The lens was repositioned with the  angled Kelman forceps.  After the lens was in adequate position, four  interrupted 10-0 nylon sutures were placed to close the wound.  The wound  was checked for vitreous and there was no vitreous present prior to placing  the sutures.  Additional Miochol was injected and the pupil was seen to come  down.  A subconjunctival injection of Decadron was given inferiorly.  The  patient was given 1 gm of Ancef IV.  Topical bacitracin ointment was  applied.  Topical Pilopine gel was applied to the eye.  A patch and Fox  shield were placed.  The phacoemulsification time was 1.5 at 30%.                                                Blake Divine., M.D.    RW/MEDQ  D:  08/02/2002  T:  08/02/2002  Job:  [id number]

## 2010-08-16 NOTE — Op Note (Signed)
[name], [name]                           ACCOUNT NO.:  [id number]   MEDICAL RECORD NO.:  [id number]                   PATIENT TYPE:  OIB   LOCATION:  2899                                 FACILITY:  [facility]   PHYSICIAN:  Alford Highland. Rankin, M.D.                DATE OF BIRTH:  November 26, 1932   DATE OF PROCEDURE:  07/24/2003  DATE OF DISCHARGE:                                 OPERATIVE REPORT   PREOPERATIVE DIAGNOSES:  1. Corneal edema, OD, nonresolving after attempted phacoemulsification and     intraocular lens placement in the right eye some months previous.  2. Retained lens fragments, OD.  3. Pseudophakia.   POSTOPERATIVE DIAGNOSES:  1. Corneal edema, OD, nonresolving after attempted phacoemulsification and     intraocular lens placement in the right eye some months previous.  2. Retained lens fragments, OD.  3. Pseudophakia.  4. Iris adhesion and chafe by anterior chamber intraocular lens haptics     nasally.  5. Retinal hole inferiorly, found in the midst of vitrectomy at the 6     o'clock position near the ora serrata.   PROCEDURES:  1. Posterior vitrectomy and Endolaser focal photocoagulation of the right     eye.  2. Penetrating keratoplasty of the right eye.  3. Repositioning of the anterior chamber intraocular lens of the right eye.  4. Removal of posterior capsule of the right eye, surgical capsulotomy.   ANESTHESIA:  General endotracheal anesthesia.   TISSUE PLACED:  Corneal donor tissue #[id number].   INDICATION FOR PROCEDURE:  The patient is a 77 year old man who has had  months of nonclearing corneal edema with a low cell count after attempted  phacoemulsification and cataract extraction with intraocular lens placement  by another surgeon.  The cornea has not cleared, and there has been in past  examinations sites of vitreous inflammatory debris and suspected retained  lens fragments.  This is noted from the review of previous chart notes found  when reviewed  from other examinations prior to my exam.  On my examination  in the office, there was corneal edema with confirmed low corneal cell  count, mild anterior segment inflammation, and significant vitreous debris  on ultrasound evaluation of the right eye.  The patient and family  understand this is an attempt to perform vitrectomy and clear the vitreous  cavity of potential inflammatory debris but also to clear the visual axis by  performing penetrating keratoplasty.  They understand the penetrating  keratoplasty may be required prior to the performance of vitrectomy and the  use of a temporary keratoprosthesis.  They understand the risks of  anesthesia, including the rare occurrence of death, and losses to the eye,  risks of his condition with combined procedures, including but not limited  to hemorrhage, infection, scarring, need for further surgery, no change in  vision, loss of vision, and progressive disease despite intervention.  Appropriate signed consent  was obtained.   He was taken to the operating room.  In the operating room, general  endotracheal anesthesia was instituted without difficulty.  The right  periocular region was sterilely prepped and draped in the usual ophthalmic  fashion.  A lid speculum applied.  Conjunctival peritomy was then fashioned  in each of the quadrants.  It was at this time that I noted that the handle  for the overhead lights had not been changed out from the prior utilization  and thus because of the substantial risk of contamination, the entire set-up  of the case had to be taken down.  It was during this time that the lid  speculum was then removed and the eye was taped closed to protect the eye.  This did result in a 15-minute delay in the case proceeding once the  instruments had been re-sterilized for safety purposes as there had been  limited contact to the surface of the eye and there had been no entry into  the eye prior to recognizing this  problem.   At this time the sterile prep and drape was applied again.  A lid speculum  applied.  At this time 5-0 [place] mattress suture was then secured in the  inferotemporal quadrant and used to secure the infusion cannula.  Placement  in the vitreous cavity verified visually.  Superior sclerotomy was then  fashioned.  At this time attempted visualization with the BIOM attached to  the wall microscope was tried.  Through the central cornea there was a very  hazy view; however, rotating the eye around the central part of the corneal  edema, adequate, even excellent, viewing could be obtained through this  cornea.  For this reason the vitrectomy proceeded.  Iatrogenic posterior  vitreous detachment was necessary, and this was carried out posteriorly and  then carried anterior to the cornea 360 degrees.  Small retina hole was  noted at the 6 o'clock position, and this was readily treated with Endolaser  photocoagulation.  No other retinal holes or tears were noted.   DORC forceps were then used to engage the remnants of the peripheral  posterior capsule, and this was removed from the eye surgically to prevent  further inflammation.  At this time iris tuck of the haptic from the haptic  nasally of the intraocular lens was noted in the anterior chamber.  This was  repositioned.   At this time the decision was made to proceed with the penetrating  keratoplasty and because of the overall low corneal cell count as well as  the significant diffuse corneal edema mostly centrally and its poor  prognosis for recovery.   A 7.5 [place] corneal trephine was selected.  This was to be used with an 8  mm corneal donor punch.  The corneal tissue was identified and the numbers  were __________ matched.  The corneal tissue was removed from the medium.  This was placed in the corneal punch of an 8 mm Barron corneal punch.  The 8 mm corneal donor was then performed and BSS was placed on top of this.   It  was at this time that the radial keratotomy marker was then used to create  the eight cardinal meridians on the cornea after locating the center of the  cornea.  Thereafter the [facility] trephine was placed using the cross hairs  over the central cornea.   The Barron trephine was applied and the corneal button was entered in the  standard fashion.  The remainder of the rim was then trimmed with sharp  Vannas scissors.  The corneal tissue was set aside and would be sent for  pathologic evaluation.   At this time the donor button was placed on the bed of Provisc overlying the  anterior chamber lens to protect the endothelium.  Low infusion pressures  were maintained, and periodically the infusion was turned off.  Interrupted  10-0 nylon were used to secure the corneal button to the host bed with 16  interrupted 10-0 nylon sutures.  The suture knots were then rotated to bury.   At this time the superior sclerotomies were closed with 7-0 Vicryl.  The  conjunctiva closed.  The infusion removed and similarly closed with 7-0  Vicryl.  Conjunctiva closed as well.  Subconjunctival injection of  antibiotic were applied.  The patient tolerated the procedure well without  complication.  Was taken to the recovery room in good, stable condition.                                               Alford Highland Rankin, M.D.    GAR/MEDQ  D:  07/24/2003  T:  07/25/2003  Job:  [id number]   cc:   Molly Maduro L. Dione Booze, M.D.  [phone] [address]  [place]  [place] [address]  Fax: [phone]

## 2010-08-23 ENCOUNTER — Other Ambulatory Visit: Payer: Self-pay | Admitting: Internal Medicine

## 2010-08-23 ENCOUNTER — Ambulatory Visit (HOSPITAL_COMMUNITY)
Admission: RE | Admit: 2010-08-23 | Discharge: 2010-08-23 | Disposition: A | Discharge status: Home / Self Care | Payer: Self-pay | Source: Ambulatory Visit | Attending: Internal Medicine | Admitting: Internal Medicine

## 2010-08-23 DIAGNOSIS — M25569 Pain in unspecified knee: Secondary | ICD-10-CM | POA: Insufficient documentation

## 2010-08-23 DIAGNOSIS — R52 Pain, unspecified: Secondary | ICD-10-CM

## 2010-08-23 DIAGNOSIS — M1710 Unilateral primary osteoarthritis, unspecified knee: Secondary | ICD-10-CM | POA: Insufficient documentation

## 2010-08-23 DIAGNOSIS — IMO0002 Reserved for concepts with insufficient information to code with codable children: Secondary | ICD-10-CM | POA: Insufficient documentation

## 2010-12-19 LAB — CBC
HCT: 50.3
Hemoglobin: 17.2 — ABNORMAL HIGH
MCHC: 34.2
MCV: 93.7
Platelets: 221
RBC: 5.37
RDW: 13.7
WBC: 13 — ABNORMAL HIGH

## 2010-12-19 LAB — DIFFERENTIAL
Basophils Absolute: 0
Basophils Relative: 0
Eosinophils Absolute: 0.1
Eosinophils Relative: 1
Lymphocytes Relative: 11 — ABNORMAL LOW
Lymphs Abs: 1.4
Monocytes Absolute: 0.7
Monocytes Relative: 5
Neutro Abs: 10.8 — ABNORMAL HIGH
Neutrophils Relative %: 83 — ABNORMAL HIGH

## 2010-12-19 LAB — URINALYSIS, ROUTINE W REFLEX MICROSCOPIC
Bilirubin Urine: NEGATIVE
Glucose, UA: NEGATIVE
Hgb urine dipstick: NEGATIVE
Ketones, ur: NEGATIVE
Nitrite: NEGATIVE
Protein, ur: NEGATIVE
Specific Gravity, Urine: 1.014
Urobilinogen, UA: 0.2
pH: 6.5

## 2010-12-19 LAB — BASIC METABOLIC PANEL
BUN: 15
CO2: 26
Calcium: 10
Chloride: 103
Creatinine, Ser: 1.17
GFR calc Af Amer: >60
GFR calc non Af Amer: >60
Glucose, Bld: 95
Potassium: 3.7
Sodium: 136

## 2011-02-07 ENCOUNTER — Encounter: Payer: Self-pay | Admitting: Pulmonary Disease

## 2011-02-10 ENCOUNTER — Encounter: Payer: Self-pay | Admitting: Pulmonary Disease

## 2011-02-10 ENCOUNTER — Ambulatory Visit (INDEPENDENT_AMBULATORY_CARE_PROVIDER_SITE_OTHER): Payer: Medicare Other | Admitting: Pulmonary Disease

## 2011-02-10 VITALS — BP 120/60 | HR 97 | Temp 98.9°F | Ht 65.0 in | Wt 167.4 lb

## 2011-02-10 DIAGNOSIS — G4733 Obstructive sleep apnea (adult) (pediatric): Secondary | ICD-10-CM

## 2011-02-10 NOTE — Patient Instructions (Signed)
Will schedule sleep test Will call to schedule follow up after sleep test reviewed 

## 2011-02-10 NOTE — Progress Notes (Signed)
Chief Complaint  Patient presents with  . Advice Only    Pt use to use cpap before but could not tolerate it. Pt c/o snoring at night and stops breathing    History of Present Illness: [name] is a 77 y.o. male for evaluation of sleep apnea.  He had his sleep study on June 18, 2004>>RDI 19.6, SpO2 low 75%.  He was tried on CPAP 11 cm H2O with RDI down to 0.  He was tried on CPAP.  He tried several masks.  He felt the pressure from his machine was too much.  He had air leaking from his mask, and his mask kept shifting.  He goes to bed at 9 pm.  He can fall asleep after about an hour.  He has tried using sleeping pills, but these seem to make things worse.  He wakes up several times with a choking sensation.  He gets out of bed at 6 am.  He feels tired all the time.  He does snore, and has been told he stops breathing while asleep.  He is not using anything to help stay awake.  The patient denies sleep walking, sleep talking, bruxism, or nightmares.  There is no history of restless legs.  The patient denies sleep hallucinations, sleep paralysis, or cataplexy.  His Epworth score is 15 out of 24.  [name] speaks only Spanish, and interview was conducted with assistance of his family member at the patient's request.    Past Medical History  Diagnosis Date  . Anxiety   . Insomnia   . Hyperlipidemia   . Reflux   . OSA (obstructive sleep apnea)   . Epigastric rebound abdominal tenderness   . Benign pigmented nevus   . Cataract     LEFT EYE  . Depression   . Esophageal reflux   . Headache   . Hearing loss   . Hemorrhoids   . Hiatal hernia   . Hypothyroidism   . Disc degeneration, lumbar   . Osteoarthritis     Past Surgical History  Procedure Date  . Back surgery     L4-L5  . Cataract surgery     WITH CORNEA COMOPLICATIONS NOW ON S/P TRANSPLANT X 3 ON RIGHT EYE  . Cholecystectomy   . Corneal transplant     X3 TRANSPLANT AT [facility] CATARACT SURGERY COMPLICATIONS     Current Outpatient Prescriptions on File Prior to Visit  Medication Sig Dispense Refill  . ibuprofen (ADVIL,MOTRIN) 800 MG tablet Take 800 mg by mouth 3 (three) times daily.        Marland Kitchen levothyroxine (SYNTHROID, LEVOTHROID) 100 MCG tablet Take 100 mcg by mouth daily.        . ranitidine (ZANTAC) 150 MG capsule Take 150 mg by mouth 2 (two) times daily.          No Known Allergies  family history includes Brain cancer in his sister and Cancer in his brother.   reports that he has never smoked. He does not have any smokeless tobacco history on file. He reports that he does not drink alcohol or use illicit drugs.  ROS: Complains of cough with swallowing pills.  Blood pressure 120/60, pulse 97, temperature 98.9 F (37.2 C), temperature source Oral, height 5\' 5"  (1.651 m), weight 167 lb 6.4 oz (75.932 kg), SpO2 96.00%.  Physical Exam:  General - No distress  HEENT - No sinus tenderness, no oral exudate, MP 3, 2+ tonsills, no LAN, no thyromegaly, wears dentures Cardiac -  s1s2 regular, no murmur Chest - no wheeze/rales/dullness Abdomen - soft, nontender Extremities - no e/c/c Neurologic - normal strength, CN intact Skin - no rashes Psychiatric - normal mood, behavior  Assessment/Plan:  OBSTRUCTIVE SLEEP APNEA He has prior history of sleep apnea.  He had difficulty with tolerating CPAP.  He continues to have sleep disruption, snoring, witnessed apnea, and daytime sleepiness.  I am concerned he still has sleep apnea.  Will arrange for repeat sleep test and then decide whether he needs additional therapy for his sleep apnea.  I explained how sleep apnea can affect the patient's health.  Driving precautions and importance of weight loss were discussed.  Treatment options for sleep apnea were reviewed.      Outpatient Encounter Prescriptions as of 02/10/2011  Medication Sig Dispense Refill  . ibuprofen (ADVIL,MOTRIN) 800 MG tablet Take 800 mg by mouth 3 (three) times daily.         Marland Kitchen levothyroxine (SYNTHROID, LEVOTHROID) 100 MCG tablet Take 100 mcg by mouth daily.        . ranitidine (ZANTAC) 150 MG capsule Take 150 mg by mouth 2 (two) times daily.        Marland Kitchen DISCONTD: escitalopram (LEXAPRO) 20 MG tablet Take 20 mg by mouth daily.        Marland Kitchen DISCONTD: gemfibrozil (LOPID) 600 MG tablet Take 600 mg by mouth 2 (two) times daily before a meal.        . DISCONTD: omeprazole (PRILOSEC) 10 MG capsule Take 10 mg by mouth daily.        Marland Kitchen DISCONTD: Tamsulosin HCl (FLOMAX) 0.4 MG CAPS Take by mouth.          Emmalynn Pinkham Pager:  [phone] 02/10/2011, 3:25 PM

## 2011-02-10 NOTE — Assessment & Plan Note (Signed)
He has prior history of sleep apnea.  He had difficulty with tolerating CPAP.  He continues to have sleep disruption, snoring, witnessed apnea, and daytime sleepiness.  I am concerned he still has sleep apnea.  Will arrange for repeat sleep test and then decide whether he needs additional therapy for his sleep apnea.  I explained how sleep apnea can affect the patient's health.  Driving precautions and importance of weight loss were discussed.  Treatment options for sleep apnea were reviewed.

## 2011-02-19 ENCOUNTER — Ambulatory Visit (HOSPITAL_BASED_OUTPATIENT_CLINIC_OR_DEPARTMENT_OTHER): Payer: Medicare Other | Attending: Pulmonary Disease | Admitting: Radiology

## 2011-02-19 VITALS — Ht 65.0 in | Wt 164.0 lb

## 2011-02-19 DIAGNOSIS — E039 Hypothyroidism, unspecified: Secondary | ICD-10-CM | POA: Insufficient documentation

## 2011-02-19 DIAGNOSIS — G4733 Obstructive sleep apnea (adult) (pediatric): Secondary | ICD-10-CM | POA: Insufficient documentation

## 2011-02-19 DIAGNOSIS — F329 Major depressive disorder, single episode, unspecified: Secondary | ICD-10-CM | POA: Insufficient documentation

## 2011-02-19 DIAGNOSIS — Z79899 Other long term (current) drug therapy: Secondary | ICD-10-CM | POA: Insufficient documentation

## 2011-02-19 DIAGNOSIS — F3289 Other specified depressive episodes: Secondary | ICD-10-CM | POA: Insufficient documentation

## 2011-02-28 ENCOUNTER — Encounter: Payer: Self-pay | Admitting: Pulmonary Disease

## 2011-02-28 ENCOUNTER — Telehealth: Payer: Self-pay | Admitting: Pulmonary Disease

## 2011-02-28 DIAGNOSIS — G4733 Obstructive sleep apnea (adult) (pediatric): Secondary | ICD-10-CM

## 2011-02-28 NOTE — Telephone Encounter (Signed)
Split night 02/19/11>>AHI 44.3, SpO2 low 90%, PLMI 1.1.  CPAP 7 cm H2O>>AHI 0.9, +R and +S.  Results explained to pt with assistance of his grand-daughter to translate.  He is agreeable to start CPAP.  Will arrange for CPAP 7 cm H2O and have my nurse schedule ROV 2 months after set up.

## 2011-02-28 NOTE — Procedures (Signed)
NAMECLEBURN, MAIOLO NO.:  [id number]  MEDICAL RECORD NO.:  [id number]          PATIENT TYPE:  OUT  LOCATION:  SLEEP CENTER                 FACILITY:  [facility]  PHYSICIAN:  Coralyn Helling, MD        DATE OF BIRTH:  [birth year]  DATE OF STUDY:  02/19/2011                           NOCTURNAL POLYSOMNOGRAM  REFERRING PHYSICIAN:  Coralyn Helling, MD  FACILITY:  [facility].  REFERRING PHYSICIAN:  Coralyn Helling, MD  INDICATION:  [name] is a 77 year old male who has a history of hypothyroidism, depression, and sleep apnea.  He had a previous sleep study on June 18, 2004, he was found to have a respiratory disturbance index of 19.6.  He was initially tried on CPAP of 11 cm water but was not able to tolerate this.  Since that time, his sleep has gotten worse, and he has had an increase in his weight.  He is therefore referred back to the Sleep Lab for further evaluation of hypersomnia with obstructive sleep apnea.  Height is 5 feet 5 inches, weight is 164 pounds, BMI is 27, neck size is 14.5 inches.  MEDICATIONS:  Advil, Synthroid, Zantac, Lexapro, Lopid, Prilosec and Flomax.  EPWORTH SCORE:  9.  SLEEP ARCHITECTURE:  The patient followed a split night study protocol. During the diagnostic portion of the study, total recording time was 130 minutes, total sleep time was 124 minutes.  Sleep efficiency was 95%. Sleep latency was 1 minute.  REM latency was 12 minutes.  This portion of the study was notable for the lack of stage 3 sleep, and he slept exclusively in the nonsupine position.  During the titration portion of the study, total recording time was 267 minutes, total sleep time was 214 minutes.  Sleep efficiency was 80%. Sleep latency was 1 minute.  REM latency was 59 minutes.  The patient was observed in all stages of sleep and slept in both the supine and nonsupine positions.  RESPIRATORY DATA:  The average respiratory rate was 20.  Loud snoring was  noted by the technician.  The overall apnea-hypopnea index was 44.3. There were 3 central apneic events and 1 mixed respiratory event.  The remainder of the events were obstructive in nature.  During the titration portion of the study, the patient was started on CPAP of 4 and increased to 7 cm water.  With CPAP set at 7 cm of water, the apnea-hypopnea index was reduced to 0.9.  At this pressure setting, he was observed in REM sleep and supine sleep.  OXYGEN DATA:  The baseline oxygenation was 95%.  The oxygen saturation nadir was 90%.  The study was conducted without the use of supplemental oxygen.  CARDIAC DATA:  The average heart rate was 60 and the rhythm strip showed sinus rhythm with occasional PACs.  MOVEMENT-PARASOMNIA:  The periodic limb movement index was 1.1, and the patient had no restroom trips.  IMPRESSION:  This study shows evidence for severe obstructive sleep apnea with an apnea-hypopnea index of 44.3, and oxygen saturation nadir of 90%.  He was successfully titrated to CPAP at 7 cm water.  With this pressure setting, his sleep apnea was  controlled.  His oxygenation was maintained, and he was observed in REM sleep and supine sleep.  I would recommend that the patient be started on CPAP at 7 cm water and monitored for his clinical response.     Coralyn Helling, MD Diplomat, American Board of Sleep Medicine    VS/MEDQ  D:  02/28/2011 08:04:26  T:  02/28/2011 09:03:35  Job:  [id number]

## 2011-03-05 DIAGNOSIS — E039 Hypothyroidism, unspecified: Secondary | ICD-10-CM

## 2011-03-05 DIAGNOSIS — Z79899 Other long term (current) drug therapy: Secondary | ICD-10-CM

## 2011-03-05 DIAGNOSIS — G4733 Obstructive sleep apnea (adult) (pediatric): Secondary | ICD-10-CM

## 2011-03-05 DIAGNOSIS — F329 Major depressive disorder, single episode, unspecified: Secondary | ICD-10-CM

## 2011-03-07 ENCOUNTER — Telehealth: Payer: Self-pay | Admitting: Pulmonary Disease

## 2011-03-07 NOTE — Telephone Encounter (Signed)
With assistance from a family member pt is scheduled to come in and see VS 03/25/12 at 1:30.

## 2011-03-07 NOTE — Telephone Encounter (Signed)
lmomtcb  

## 2011-03-10 NOTE — Telephone Encounter (Signed)
LMTCB

## 2011-03-11 NOTE — Telephone Encounter (Signed)
[id number] x 1 

## 2011-03-12 NOTE — Telephone Encounter (Signed)
LMOM to call back to leave a new msg for the nurse. We have attempted to call the pt four times and not received a callback. Will close this msg.

## 2011-03-19 ENCOUNTER — Ambulatory Visit (INDEPENDENT_AMBULATORY_CARE_PROVIDER_SITE_OTHER): Payer: Medicare Other | Admitting: Adult Health

## 2011-03-19 ENCOUNTER — Encounter: Payer: Self-pay | Admitting: Adult Health

## 2011-03-19 VITALS — BP 130/78 | HR 89 | Temp 96.9°F | Ht 65.0 in | Wt 167.0 lb

## 2011-03-19 DIAGNOSIS — G4733 Obstructive sleep apnea (adult) (pediatric): Secondary | ICD-10-CM

## 2011-03-19 NOTE — Assessment & Plan Note (Signed)
Will set up with DME for pt instruction and mask fitting  To begin CPAP at 7 cm H2O.  Pt to return in 6 weeks as planned for follow up

## 2011-03-19 NOTE — Progress Notes (Signed)
  Subjective:    Patient ID: [name], male    DOB: 04-22-1932, 77 y.o.   MRN: [id number]  HPI 77 yo male with hx of OSA  02/10/11 Pulmonary consult Seen for sleep apnea evaluation   03/19/2011  Pt returns to discuss sleep study results. He underwent sleep study > Split night 02/19/11>>AHI 44.3, SpO2 low 90%, PLMI 1.1 CPAP 7 cm H2O>>AHI 0.9, +R and +S.   He was recommended to start CPAP 7 cm H2O  Unfortunately he does not speak english and information was not communicated correctly thru family member. He is with his daughter today that speaks good english and translated the exam recommendations today .  He has had in past nasal pillows that he did not tolerate.  Prefers face mask that he used during study.  We will contact DME with mask fitting and pt/family member instructions.      Review of Systems Constitutional:   No  weight loss, night sweats,  Fevers, chills,  ++fatigue, or  Lassitude.+ snoring   HEENT:   No headaches,  Difficulty swallowing,  Tooth/dental problems, or  Sore throat,                No sneezing, itching, ear ache, nasal congestion, post nasal drip,   CV:  No chest pain,  Orthopnea, PND, swelling in lower extremities, anasarca, dizziness, palpitations, syncope.   GI  No heartburn, indigestion, abdominal pain, nausea, vomiting, diarrhea, change in bowel habits, loss of appetite, bloody stools.   Resp: No shortness of breath with exertion or at rest.  No excess mucus, no productive cough,  No non-productive cough,  No coughing up of blood.  No change in color of mucus.  No wheezing.  No chest wall deformity  Skin: no rash or lesions.  GU: no dysuria, change in color of urine, no urgency or frequency.  No flank pain, no hematuria   MS:  No joint pain or swelling.  No decreased range of motion.  No back pain.  Psych:  No change in mood or affect. No depression or anxiety.  No memory loss.         Objective:   Physical Exam GEN: A/Ox3; pleasant ,  NAD, well nourished   HEENT:  [place]/AT,  EACs-clear, TMs-wnl, NOSE-clear, THROAT-clear, no lesions, no postnasal drip or exudate noted.   NECK:  Supple w/ fair ROM; no JVD; normal carotid impulses w/o bruits; no thyromegaly or nodules palpated; no lymphadenopathy.  RESP  Clear  P & A; w/o, wheezes/ rales/ or rhonchi.no accessory muscle use, no dullness to percussion  CARD:  RRR, no m/r/g  , no peripheral edema, pulses intact, no cyanosis or clubbing.  GI:   Soft & nt; nml bowel sounds; no organomegaly or masses detected.  Musco: Warm bil, no deformities or joint swelling noted.   Neuro: alert, no focal deficits noted.    Skin: Warm, no lesions or rashes         Assessment & Plan:

## 2011-03-19 NOTE — Progress Notes (Signed)
Reviewed and agree with plan.

## 2011-03-19 NOTE — Patient Instructions (Signed)
We are setting you up for CPAP with [facility] they will help with mask fitting and machine demo.  Call if you have any trouble and questions Wear CPAP every night - all night.  follow up Dr. Craige Cotta  In 6 weeks as planned

## 2011-04-29 ENCOUNTER — Encounter: Payer: Self-pay | Admitting: Pulmonary Disease

## 2011-04-29 ENCOUNTER — Telehealth: Payer: Self-pay | Admitting: Pulmonary Disease

## 2011-04-29 NOTE — Telephone Encounter (Signed)
CPAP 03/21/11 to 04/19/11>>Used on 29 of 30 nights with average 7 hrs 57 min.  Average AHI 5.9 with CPAP 7 cm H2O.  Will have my nurse inform pt that CPAP report showed good control of sleep apnea with current settings.  No change to set up needed at this time.

## 2011-04-29 NOTE — Telephone Encounter (Signed)
I spoke with pt son Madaline Guthrie and advised him of pt's results. He voiced his understanding and had informed pt of these results while on the phone. Pt voiced his understanding and had no questions

## 2011-05-27 ENCOUNTER — Ambulatory Visit (INDEPENDENT_AMBULATORY_CARE_PROVIDER_SITE_OTHER): Payer: Medicare Other | Admitting: Pulmonary Disease

## 2011-05-27 ENCOUNTER — Encounter: Payer: Self-pay | Admitting: Pulmonary Disease

## 2011-05-27 VITALS — BP 126/88 | HR 87 | Temp 96.5°F | Ht 65.0 in | Wt 173.4 lb

## 2011-05-27 DIAGNOSIS — G4733 Obstructive sleep apnea (adult) (pediatric): Secondary | ICD-10-CM

## 2011-05-27 NOTE — Assessment & Plan Note (Signed)
He has good control of his apnea with CPAP 7 cm H2O.  He is reluctant to continue using CPAP indefinitely, and wants to investigate alternative therapies for his sleep apnea.  Will refer to ENT to evaluate for surgical intervention for his sleep apnea.  I explained to him that my recommendation is to continue with CPAP therapy as his best option.

## 2011-05-27 NOTE — Progress Notes (Signed)
Chief Complaint  Patient presents with  . Follow-up    6 week follow up - reports doing well on the CPAP and is wearing every night exept x2 nights    History of Present Illness: [name] is a 78 y.o. male with OSA.  He has done well with CPAP.  He feels this helps his sleep and energy.  He is worried about how he will travel with the machine.  He is also worried about what people in [place] will think when he visits relatives.   Past Medical History  Diagnosis Date  . Anxiety   . Insomnia   . Hyperlipidemia   . Reflux   . OSA (obstructive sleep apnea)   . Epigastric rebound abdominal tenderness   . Benign pigmented nevus   . Cataract     LEFT EYE  . Depression   . Esophageal reflux   . Headache   . Hearing loss   . Hemorrhoids   . Hiatal hernia   . Hypothyroidism   . Disc degeneration, lumbar   . Osteoarthritis     Past Surgical History  Procedure Date  . Back surgery     L4-L5  . Cataract surgery     WITH CORNEA COMOPLICATIONS NOW ON S/P TRANSPLANT X 3 ON RIGHT EYE  . Cholecystectomy   . Corneal transplant     X3 TRANSPLANT AT [facility] CATARACT SURGERY COMPLICATIONS    No Known Allergies  Physical Exam:  Blood pressure 126/88, pulse 87, temperature 96.5 F (35.8 C), temperature source Oral, height 5\' 5"  (1.651 m), weight 173 lb 6.4 oz (78.654 kg), SpO2 98.00%. Body mass index is 28.86 kg/(m^2). Wt Readings from Last 2 Encounters:  05/27/11 173 lb 6.4 oz (78.654 kg)  03/19/11 167 lb (75.751 kg)    General - No distress  HEENT - No sinus tenderness, no oral exudate, MP 3, 2+ tonsills, no LAN, no thyromegaly, wears dentures  Cardiac - s1s2 regular, no murmur  Chest - no wheeze/rales/dullness  Abdomen - soft, nontender  Extremities - no e/c/c  Neurologic - normal strength, CN intact  Skin - no rashes  Psychiatric - normal mood, behavior  PSG 06/18/04>>RDI 19.6, SpO2 low 75%; CPAP 11 cm H2O>>RDI 0.  Unable to tolerate CPAP. Split night  02/19/11>>AHI 44.3, SpO2 low 90%, PLMI 1.1.  CPAP 7 cm H2O>>AHI 0.9, +R and +S. CPAP 04/21/11 to 05/20/11>>Used on 28 of 30 nights with average 7 hrs 45 min.  Average AHI 4.8 with CPAP 7 cm H2O.  Assessment/Plan:  Outpatient Encounter Prescriptions as of 05/27/2011  Medication Sig Dispense Refill  . fenofibrate 160 MG tablet Take 160 mg by mouth daily.        . Fluorescein-Benoxinate (FLUROX OP) Apply to eye. 1 drop in right eye bid       . levothyroxine (SYNTHROID, LEVOTHROID) 100 MCG tablet Take 100 mcg by mouth daily.        Marland Kitchen omeprazole (PRILOSEC) 40 MG capsule Take 40 mg by mouth daily.        . ranitidine (ZANTAC) 150 MG capsule Take 150 mg by mouth 2 (two) times daily.        . Tamsulosin HCl (FLOMAX) 0.4 MG CAPS Take 0.4 mg by mouth daily.        Marland Kitchen escitalopram (LEXAPRO) 20 MG tablet Take 20 mg by mouth daily.        Marland Kitchen ibuprofen (ADVIL,MOTRIN) 800 MG tablet Take 800 mg by mouth 3 (three) times daily.  [name] Pager:  [phone] 05/27/2011, 1:48 PM

## 2011-05-27 NOTE — Patient Instructions (Signed)
Will arrange for ENT evaluation>>continue using CPAP until seen by ENT surgeon. Follow up in 6 months

## 2011-05-30 ENCOUNTER — Encounter: Payer: Self-pay | Admitting: Pulmonary Disease

## 2011-06-29 ENCOUNTER — Emergency Department (INDEPENDENT_AMBULATORY_CARE_PROVIDER_SITE_OTHER)
Admission: EM | Admit: 2011-06-29 | Discharge: 2011-06-29 | Disposition: A | Discharge status: Home / Self Care | Payer: Medicare Other | Source: Home / Self Care

## 2011-06-29 ENCOUNTER — Encounter (HOSPITAL_COMMUNITY): Payer: Self-pay | Admitting: *Deleted

## 2011-06-29 DIAGNOSIS — A09 Infectious gastroenteritis and colitis, unspecified: Secondary | ICD-10-CM

## 2011-06-29 DIAGNOSIS — A084 Viral intestinal infection, unspecified: Secondary | ICD-10-CM

## 2011-06-29 MED ORDER — SODIUM CHLORIDE 0.9 % IV SOLN
Freq: Once | INTRAVENOUS | Status: AC
  Administered 2011-06-29: 17:00:00 via INTRAVENOUS

## 2011-06-29 NOTE — ED Notes (Signed)
1 liter NS infused.  Patient states he's feeling much better; dizziness has resolved.

## 2011-06-29 NOTE — ED Notes (Signed)
Pt resting.  Comfort measures offered.  No c/o's.  IV fluids infusing without difficulty.

## 2011-06-29 NOTE — Discharge Instructions (Signed)
Gracias por venir hoy.  ° °

## 2011-06-29 NOTE — ED Provider Notes (Signed)
Sutter Ahlgren is a 78 y.o. male who presents to Urgent Care today for diarrhea and dizziness. Starting yesterday he developed diffuse watery nonbloody diarrhea without significant abdominal pain. He's developed some dizziness and lightheadedness especially when standing. He feels well otherwise doesn't have any trouble breathing. His son was in the urgent care yesterday with vomiting and orthostatic and received a liter bolus.     PMH reviewed. Significant for hypothyroidism and hyperlipidemia. No history of heart failure ROS as above otherwise neg.  no chest pains, palpitations, fevers, chills, abdominal pain nausea or vomiting. Medications reviewed. Current Facility-Administered Medications  Medication Dose Route Frequency Provider Last Rate Last Dose  . 0.9 %  sodium chloride infusion   Intravenous Once Rodolph Bong, MD 999 mL/hr at 06/29/11 1719     Current Outpatient Prescriptions  Medication Sig Dispense Refill  . levothyroxine (SYNTHROID, LEVOTHROID) 100 MCG tablet Take 100 mcg by mouth daily.        Marland Kitchen omeprazole (PRILOSEC) 40 MG capsule Take 40 mg by mouth daily.        . ranitidine (ZANTAC) 150 MG capsule Take 150 mg by mouth 2 (two) times daily.        Marland Kitchen escitalopram (LEXAPRO) 20 MG tablet Take 20 mg by mouth daily.        . fenofibrate 160 MG tablet Take 160 mg by mouth daily.        . Fluorescein-Benoxinate (FLUROX OP) Apply to eye. 1 drop in right eye bid       . ibuprofen (ADVIL,MOTRIN) 800 MG tablet Take 800 mg by mouth 3 (three) times daily.        . Tamsulosin HCl (FLOMAX) 0.4 MG CAPS Take 0.4 mg by mouth daily.          Exam:  BP 121/85  Pulse 96  Temp(Src) 98.8 F (37.1 C) (Oral)  Resp 20  SpO2 100% Gen: Well NAD, well-appearing able to talk and converse normally. Able to walk normally HEENT: EOMI,  MMM Lungs: CTABL Nl WOB Heart: RRR no MRG Abd: NABS, NT, ND Exts: Non edematous BL  LE, warm and well perfused.   Filed Vitals:   06/29/11 1649 06/29/11 1649  06/29/11 1651 06/29/11 1839  BP: 130/95 109/89 107/89 121/85  Pulse: 105 122 130 96  Temp:      TempSrc:      Resp:      SpO2:    100%   Patient was given a 1 L normal saline bolus do to his orthostatic. Following the fluid rehydration he felt much better and his heart rate reduced to 96 beats per minute. Following fluid his lungs were clear to auscultation bilaterally  Assessment and Plan: 78 y.o. male with viral gastroenteritis with mild dehydration. Dehydration was treated with 1 L fluid resuscitation.  He felt much better after the liter of normal saline.  I advised return to home and watchful waiting.  Recommend followup with his primary care doctor in one or 2 days.  Patient expresses understanding.  Handout provided.     Rodolph Bong, MD 06/29/11 2001

## 2011-06-29 NOTE — ED Provider Notes (Signed)
Medical screening examination/treatment/procedure(s) were performed by non-physician practitioner and as supervising physician I was immediately available for consultation/collaboration.  Tavita Eastham   Zamirah Denny, MD 06/29/11 2001 

## 2011-06-29 NOTE — ED Notes (Addendum)
Started with diarrhea yesterday at 1200 with some nausea.  Denies any vomiting or fevers.  Throughout the night had watery diarrhea approx Q 30 min; today watery diarrhea approx Q 2 hrs.  Last night had some epigastric pain, but denies any pain today.  Has not taken any medicine for sxs besides his normal omeprazole and ranitidine.  C/O dizziness today with position changes.

## 2011-10-26 ENCOUNTER — Encounter (HOSPITAL_COMMUNITY): Payer: Self-pay | Admitting: Emergency Medicine

## 2011-10-26 ENCOUNTER — Emergency Department (HOSPITAL_COMMUNITY)
Admission: EM | Admit: 2011-10-26 | Discharge: 2011-10-26 | Disposition: A | Discharge status: Home / Self Care | Payer: Medicare Other | Source: Home / Self Care | Attending: Emergency Medicine | Admitting: Emergency Medicine

## 2011-10-26 DIAGNOSIS — R51 Headache: Secondary | ICD-10-CM

## 2011-10-26 MED ORDER — HYDROCODONE-ACETAMINOPHEN 5-325 MG PO TABS
2.0000 | ORAL_TABLET | Freq: Once | ORAL | Status: AC
  Administered 2011-10-26: 2 via ORAL

## 2011-10-26 MED ORDER — HYDROCODONE-ACETAMINOPHEN 5-500 MG PO TABS: 1.0000 | ORAL_TABLET | Freq: Four times a day (QID) | ORAL | Status: AC | PRN

## 2011-10-26 MED ORDER — HYDROCODONE-ACETAMINOPHEN 5-325 MG PO TABS
ORAL_TABLET | ORAL | Status: AC
  Filled 2011-10-26: qty 2

## 2011-10-26 NOTE — ED Notes (Signed)
Pt is Spanish speaking gentleman c/o headache, weakness and dizziness for 3 days. Saw PCP last week for routine visit, before these sx began.

## 2011-10-26 NOTE — ED Provider Notes (Signed)
History     CSN: [id number]  Arrival date & time 10/26/11  1405   First MD Initiated Contact with Patient 10/26/11 1433      Chief Complaint  Patient presents with  . Headache  . Fatigue    (Consider location/radiation/quality/duration/timing/severity/associated sxs/prior treatment) HPI Comments: Mr. Daughdrill, presents to urgent care this Sunday afternoon complaining of a waxing and waning moderate headache that he's been expressing for the last 3 days. Patient describes that he recently returned from [place] and during one particular afternoon recently was drinking with some old friends in an environment where he had a speaker to close to him and he thinks that since that day he is been expressing this headache that fluctuates from mild/moderate to bothersome just like today. He goes into describing how he didn't notice that he had his hearing aid on while he was so close to speaker with allowed music. When inquired about specific symptoms patient denies feeling any numbness, tingling, weakness of any upper or lower extremities. No visual changes, dizziness, or troubles ambulating. Denies any nausea or vomiting. He described he was seen by his doctor last week but was merely having a minimal mild headache that did not even mention is Dr. he has been free of pain and discomfort during the middle of the week. Today he woke up with this headache again and wanted to be checked.  Patient is a 78 y.o. male presenting with headaches. The history is provided by the patient and a relative.  Headache The primary symptoms include headaches and visual change. Primary symptoms do not include syncope, loss of consciousness, altered mental status, dizziness, paresthesias, focal weakness, loss of sensation, speech change, memory loss, fever, nausea or vomiting. The symptoms began 3 to 5 days ago. The symptoms are waxing and waning. The neurological symptoms are diffuse.  The headache is associated with visual  change. The headache is not associated with photophobia, neck stiffness, paresthesias, weakness or loss of balance.  The visual change does not include photophobia.  Additional symptoms do not include neck stiffness, weakness, loss of balance, photophobia, tinnitus, vertigo or irritability. Medical issues do not include cerebral vascular accident or hypertension.    Past Medical History  Diagnosis Date  . Anxiety   . Insomnia   . Hyperlipidemia   . Reflux   . OSA (obstructive sleep apnea)   . Epigastric rebound abdominal tenderness   . Benign pigmented nevus   . Cataract     LEFT EYE  . Depression   . Esophageal reflux   . Headache   . Hearing loss   . Hemorrhoids   . Hiatal hernia   . Hypothyroidism   . Disc degeneration, lumbar   . Osteoarthritis     Past Surgical History  Procedure Date  . Back surgery     L4-L5  . Cataract surgery     WITH CORNEA COMOPLICATIONS NOW ON S/P TRANSPLANT X 3 ON RIGHT EYE  . Cholecystectomy   . Corneal transplant     X3 TRANSPLANT AT [facility] CATARACT SURGERY COMPLICATIONS    Family History  Problem Relation Age of Onset  . Cancer Brother   . Brain cancer Sister     History  Substance Use Topics  . Smoking status: Never Smoker   . Smokeless tobacco: Not on file  . Alcohol Use: No      Review of Systems  Constitutional: Negative for fever, chills, activity change and irritability.  HENT: Negative for neck stiffness and  tinnitus.   Eyes: Negative for photophobia and visual disturbance.  Cardiovascular: Negative for chest pain and syncope.  Gastrointestinal: Negative for nausea and vomiting.  Neurological: Positive for headaches. Negative for dizziness, vertigo, speech change, focal weakness, loss of consciousness, speech difficulty, weakness, light-headedness, numbness, paresthesias and loss of balance.  Psychiatric/Behavioral: Negative for memory loss and altered mental status.    Allergies  Review of patient's  allergies indicates no known allergies.  Home Medications   Current Outpatient Rx  Name Route Sig Dispense Refill  . FENOFIBRATE 160 MG PO TABS Oral Take 160 mg by mouth daily.      Marland Kitchen LEVOTHYROXINE SODIUM 100 MCG PO TABS Oral Take 100 mcg by mouth daily.      Marland Kitchen OMEPRAZOLE 40 MG PO CPDR Oral Take 40 mg by mouth daily.      Marland Kitchen ESCITALOPRAM OXALATE 20 MG PO TABS Oral Take 20 mg by mouth daily.      Marland Kitchen FLUROX OP Ophthalmic Apply to eye. 1 drop in right eye bid     . HYDROCODONE-ACETAMINOPHEN 5-500 MG PO TABS Oral Take 1-2 tablets by mouth every 6 (six) hours as needed for pain. 15 tablet 0  . IBUPROFEN 800 MG PO TABS Oral Take 800 mg by mouth 3 (three) times daily.      Marland Kitchen RANITIDINE HCL 150 MG PO CAPS Oral Take 150 mg by mouth 2 (two) times daily.      Marland Kitchen TAMSULOSIN HCL 0.4 MG PO CAPS Oral Take 0.4 mg by mouth daily.        BP 116/72  Pulse 92  Temp 98.1 F (36.7 C) (Oral)  Resp 20  SpO2 100%  Physical Exam  Nursing note reviewed. Constitutional: He is oriented to person, place, and time. He appears well-developed and well-nourished.  Non-toxic appearance. He does not have a sickly appearance. No distress.  HENT:  Head: Normocephalic.  Mouth/Throat: No oropharyngeal exudate.  Eyes: No scleral icterus.  Neck: Neck supple. No JVD present.  Lymphadenopathy:    He has no cervical adenopathy.  Neurological: He is alert and oriented to person, place, and time. No cranial nerve deficit or sensory deficit. He exhibits normal muscle tone. Coordination and gait normal. GCS eye subscore is 4. GCS verbal subscore is 5. GCS motor subscore is 6.       Mr. Guerrette, looks comfortable during neurological exam, jovial and coherent and denies any dizziness or localized weakness.   Skin: No rash noted. No erythema.    ED Course  Procedures (including critical care time)  Labs Reviewed - No data to display No results found.   1. Headache       MDM   Patient most likely experiencing tensional  type of headache. Patient had no further neurological symptoms and his neurological exam was unremarkable. Patient described that while he was waiting in during exam that although he still has a headache it's not as bad as it was this morning. Patient is jovial comfortable in good humor and describing his recent trip to [place] in great detail. I have discussed with Mr. Vejar what symptoms will be indicative of the need for further an emergent evaluation. He acknowledges and agrees that if any new symptoms or worsening headache or not responsive to Lortab in the next 24 hours that he would go to the emergency department for further evaluation. He will rest today and prevent any visual or auditory stimulation.       Jimmie Molly, MD 10/26/11 [phone]

## 2011-11-04 ENCOUNTER — Telehealth: Payer: Self-pay | Admitting: Pulmonary Disease

## 2011-11-04 NOTE — Telephone Encounter (Signed)
Called pt to schedule follow up apt x3.Sent letter 10/27/11. ° °

## 2013-04-10 ENCOUNTER — Encounter (HOSPITAL_COMMUNITY): Payer: Self-pay | Admitting: Emergency Medicine

## 2013-04-10 ENCOUNTER — Emergency Department (HOSPITAL_COMMUNITY)
Admission: EM | Admit: 2013-04-10 | Discharge: 2013-04-10 | Disposition: A | Discharge status: Home / Self Care | Payer: Medicare Other | Attending: Emergency Medicine | Admitting: Emergency Medicine

## 2013-04-10 ENCOUNTER — Emergency Department (HOSPITAL_COMMUNITY): Payer: Medicare Other

## 2013-04-10 DIAGNOSIS — J40 Bronchitis, not specified as acute or chronic: Secondary | ICD-10-CM

## 2013-04-10 DIAGNOSIS — M5137 Other intervertebral disc degeneration, lumbosacral region: Secondary | ICD-10-CM | POA: Insufficient documentation

## 2013-04-10 DIAGNOSIS — Z9849 Cataract extraction status, unspecified eye: Secondary | ICD-10-CM | POA: Insufficient documentation

## 2013-04-10 DIAGNOSIS — F3289 Other specified depressive episodes: Secondary | ICD-10-CM | POA: Insufficient documentation

## 2013-04-10 DIAGNOSIS — H9190 Unspecified hearing loss, unspecified ear: Secondary | ICD-10-CM | POA: Insufficient documentation

## 2013-04-10 DIAGNOSIS — M51379 Other intervertebral disc degeneration, lumbosacral region without mention of lumbar back pain or lower extremity pain: Secondary | ICD-10-CM | POA: Insufficient documentation

## 2013-04-10 DIAGNOSIS — G4700 Insomnia, unspecified: Secondary | ICD-10-CM | POA: Insufficient documentation

## 2013-04-10 DIAGNOSIS — R062 Wheezing: Secondary | ICD-10-CM | POA: Insufficient documentation

## 2013-04-10 DIAGNOSIS — Z8669 Personal history of other diseases of the nervous system and sense organs: Secondary | ICD-10-CM | POA: Insufficient documentation

## 2013-04-10 DIAGNOSIS — F329 Major depressive disorder, single episode, unspecified: Secondary | ICD-10-CM | POA: Insufficient documentation

## 2013-04-10 DIAGNOSIS — E039 Hypothyroidism, unspecified: Secondary | ICD-10-CM | POA: Insufficient documentation

## 2013-04-10 DIAGNOSIS — F411 Generalized anxiety disorder: Secondary | ICD-10-CM | POA: Insufficient documentation

## 2013-04-10 DIAGNOSIS — Z79899 Other long term (current) drug therapy: Secondary | ICD-10-CM | POA: Insufficient documentation

## 2013-04-10 DIAGNOSIS — Z8719 Personal history of other diseases of the digestive system: Secondary | ICD-10-CM | POA: Insufficient documentation

## 2013-04-10 DIAGNOSIS — G4733 Obstructive sleep apnea (adult) (pediatric): Secondary | ICD-10-CM | POA: Insufficient documentation

## 2013-04-10 DIAGNOSIS — J209 Acute bronchitis, unspecified: Secondary | ICD-10-CM | POA: Insufficient documentation

## 2013-04-10 DIAGNOSIS — M1990 Unspecified osteoarthritis, unspecified site: Secondary | ICD-10-CM | POA: Insufficient documentation

## 2013-04-10 DIAGNOSIS — E785 Hyperlipidemia, unspecified: Secondary | ICD-10-CM | POA: Insufficient documentation

## 2013-04-10 DIAGNOSIS — K219 Gastro-esophageal reflux disease without esophagitis: Secondary | ICD-10-CM | POA: Insufficient documentation

## 2013-04-10 MED ORDER — AZITHROMYCIN 250 MG PO TABS: ORAL_TABLET | ORAL | Status: DC

## 2013-04-10 MED ORDER — PREDNISONE 20 MG PO TABS
60.0000 mg | ORAL_TABLET | Freq: Once | ORAL | Status: AC
  Administered 2013-04-10: 60 mg via ORAL
  Filled 2013-04-10: qty 3

## 2013-04-10 MED ORDER — PREDNISONE 10 MG PO TABS: 20.0000 mg | ORAL_TABLET | Freq: Every day | ORAL | Status: DC

## 2013-04-10 MED ORDER — IPRATROPIUM BROMIDE 0.02 % IN SOLN
0.5000 mg | Freq: Once | RESPIRATORY_TRACT | Status: AC
  Administered 2013-04-10: 0.5 mg via RESPIRATORY_TRACT
  Filled 2013-04-10: qty 2.5

## 2013-04-10 MED ORDER — ALBUTEROL SULFATE HFA 108 (90 BASE) MCG/ACT IN AERS: 2.0000 | INHALATION_SPRAY | Freq: Four times a day (QID) | RESPIRATORY_TRACT | Status: DC | PRN

## 2013-04-10 MED ORDER — ALBUTEROL SULFATE (2.5 MG/3ML) 0.083% IN NEBU
5.0000 mg | INHALATION_SOLUTION | Freq: Once | RESPIRATORY_TRACT | Status: AC
  Administered 2013-04-10: 5 mg via RESPIRATORY_TRACT
  Filled 2013-04-10: qty 6

## 2013-04-10 NOTE — ED Notes (Signed)
Pt is here with cough that makes his chest hurt when he coughs, sore throat, headache to forehead and posterior head.  Pt denies fever

## 2013-04-10 NOTE — ED Notes (Signed)
Pt Sats were never at 86%-  Documentation error- corrected.

## 2013-04-10 NOTE — Discharge Instructions (Signed)
Bronquitis  (Bronchitis)  La bronquitis es una inflamación de las vías respiratorias que se extienden desde la tráquea hasta los pulmones (bronquios). A menudo, la inflamación produce la formación de mucosidad, lo que genera tos. Si la inflamación es grave, puede provocar falta de aire.  CAUSAS   Las causas de la bronquitis pueden ser:   · Infecciones virales.  · Bacterias.  · Humo del cigarrillo.  · Alérgenos, contaminantes y otros irritantes.  SIGNOS Y SÍNTOMAS   El síntoma más habitual de la bronquitis es la tos frecuente con mucosidad. Otros síntomas son:  · Fiebre.  · Dolores en el cuerpo.  · Congestión en el pecho.  · Escalofríos.  · Falta de aire.  · Dolor de garganta.  DIAGNÓSTICO   La bronquitis en general se diagnostica con la historia clínica y un examen físico. En algunos casos se indican otros estudios, como radiografías, para descartar otras enfermedades.   TRATAMIENTO   Tal vez deba evitar el contacto con la causa del problema (por ejemplo, el cigarrillo). En algunos casos es necesario administrar medicamentos. Estos pueden ser:  · Antibióticos. Tal vez se los receten si la causa de la bronquitis es una bacteria.  · Antitusivos. Tal vez se los receten para aliviar los síntomas de la tos.  · Medicamentos inhalados. Tal vez se los receten para liberar las vías respiratorias y facilitar la respiración.  · Medicamentos con corticoides. Tal vez se los receten si tiene bronquitis recurrente (crónica).  INSTRUCCIONES PARA EL CUIDADO EN EL HOGAR  · Descanse lo suficiente.  · Beba líquidos en abundancia para mantener la orina de color claro o amarillo pálido (excepto que padezca una enfermedad que requiera la restricción de líquidos). Tome mucho líquido para disolver las secreciones y evitar la deshidratación.  · Tome solo medicamentos de venta libre o recetados, según las indicaciones del médico.  · Tome los antibióticos exclusivamente según las indicaciones. Finalice la prescripción completa, aunque se  sienta mejor.  · Evite el humo de segunda mano, los químicos irritantes y los vapores fuertes. Estos agentes empeoran la bronquitis. Si es fumador, abandone el hábito. Considere el uso de goma de mascar o la aplicación de parches en la piel que contengan nicotina para aliviar los síntomas de abstinencia. Si deja de fumar, sus pulmones se curarán más rápido.  · Ponga un humidificador de vapor frío en la habitación por la noche para humedecer el aire. Puede ayudarlo a aflojar la mucosidad. Cambie el agua del humidificador a diario. También puede abrir el agua caliente de la ducha y sentarse en el baño con la puerta cerrada durante 5 a 10 minutos.  · Concurra a las consultas de control con su médico según las indicaciones.  · Lávese las manos con frecuencia para evitar contagiarse bronquitis nuevamente y para no extender la infección a otras personas.  SOLICITE ATENCIÓN MÉDICA SI:  Los síntomas no mejoran después de 1 semana de tratamiento.   SOLICITE ATENCIÓN MÉDICA DE INMEDIATO SI:  · La fiebre aumenta.  · Tiene escalofríos.  · Siente dolor en el pecho.  · Le empeora la falta el aire.  · La flema tiene sangre.  · Se desmaya.  · Tiene vahídos.  · Sufre un dolor intenso de cabeza.  · Vomita repetidas veces.  ASEGÚRESE DE QUE:   · Comprende estas instrucciones.  · Controlará su afección.  · Recibirá ayuda de inmediato si no mejora o si empeora.  Document Released: 03/17/2005 Document Revised: 01/05/2013  [employer]® Patient Information ©2014 [employer].

## 2013-04-10 NOTE — ED Provider Notes (Signed)
CSN: [id number]     Arrival date & time 04/10/13  1456 History   First MD Initiated Contact with Patient 04/10/13 1751     Chief Complaint  Patient presents with  . Cough   (Consider location/radiation/quality/duration/timing/severity/associated sxs/prior Treatment) Patient is a 80 y.o. male presenting with cough. The history is provided by the patient.  Cough  patient here complaining of cough x3 days has been nonproductive. Denies any anginal type chest pain. No fever or chills. No orthopnea or dyspnea on exertion. No treatment used prior to arrival. No vomiting or diarrhea. No prior history of same.  Past Medical History  Diagnosis Date  . Anxiety   . Insomnia   . Hyperlipidemia   . Reflux   . OSA (obstructive sleep apnea)   . Epigastric rebound abdominal tenderness   . Benign pigmented nevus   . Cataract     LEFT EYE  . Depression   . Esophageal reflux   . Headache(784.0)   . Hearing loss   . Hemorrhoids   . Hiatal hernia   . Hypothyroidism   . Disc degeneration, lumbar   . Osteoarthritis    Past Surgical History  Procedure Laterality Date  . Back surgery      L4-L5  . Cataract surgery      WITH CORNEA COMOPLICATIONS NOW ON S/P TRANSPLANT X 3 ON RIGHT EYE  . Cholecystectomy    . Corneal transplant      X3 TRANSPLANT AT [facility] CATARACT SURGERY COMPLICATIONS   Family History  Problem Relation Age of Onset  . Cancer Brother   . Brain cancer Sister    History  Substance Use Topics  . Smoking status: Never Smoker   . Smokeless tobacco: Not on file  . Alcohol Use: No    Review of Systems  Respiratory: Positive for cough.   All other systems reviewed and are negative.    Allergies  Review of patient's allergies indicates no known allergies.  Home Medications   Current Outpatient Rx  Name  Route  Sig  Dispense  Refill  . escitalopram (LEXAPRO) 20 MG tablet   Oral   Take 20 mg by mouth daily.           . fenofibrate 160 MG tablet   Oral  Take 160 mg by mouth daily.           . Fluorescein-Benoxinate (FLUROX OP)   Ophthalmic   Apply to eye. 1 drop in right eye bid          . ibuprofen (ADVIL,MOTRIN) 800 MG tablet   Oral   Take 800 mg by mouth 3 (three) times daily.           Marland Kitchen levothyroxine (SYNTHROID, LEVOTHROID) 100 MCG tablet   Oral   Take 100 mcg by mouth daily.           Marland Kitchen omeprazole (PRILOSEC) 40 MG capsule   Oral   Take 40 mg by mouth daily.           . ranitidine (ZANTAC) 150 MG capsule   Oral   Take 150 mg by mouth 2 (two) times daily.           . Tamsulosin HCl (FLOMAX) 0.4 MG CAPS   Oral   Take 0.4 mg by mouth daily.            BP 160/95  Pulse 89  Temp(Src) 97.8 F (36.6 C) (Oral)  Resp 18  Wt 174 lb (78.926  kg)  SpO2 96% Physical Exam  Nursing note and vitals reviewed. Constitutional: He is oriented to person, place, and time. He appears well-developed and well-nourished.  Non-toxic appearance. No distress.  HENT:  Head: Normocephalic and atraumatic.  Eyes: Conjunctivae, EOM and lids are normal. Pupils are equal, round, and reactive to light.  Neck: Normal range of motion. Neck supple. No tracheal deviation present. No mass present.  Cardiovascular: Normal rate, regular rhythm and normal heart sounds.  Exam reveals no gallop.   No murmur heard. Pulmonary/Chest: Effort normal. No stridor. No respiratory distress. He has decreased breath sounds. He has wheezes. He has no rhonchi. He has no rales.  Abdominal: Soft. Normal appearance and bowel sounds are normal. He exhibits no distension. There is no tenderness. There is no rebound and no CVA tenderness.  Musculoskeletal: Normal range of motion. He exhibits no edema and no tenderness.  Neurological: He is alert and oriented to person, place, and time. He has normal strength. No cranial nerve deficit or sensory deficit. GCS eye subscore is 4. GCS verbal subscore is 5. GCS motor subscore is 6.  Skin: Skin is warm and dry. No abrasion  and no rash noted.  Psychiatric: He has a normal mood and affect. His speech is normal and behavior is normal.    ED Course  Procedures (including critical care time) Labs Review Labs Reviewed - No data to display Imaging Review No results found.    Date: 04/10/2013  Rate: 75  Rhythm: normal sinus rhythm  QRS Axis: normal  Intervals: normal  ST/T Wave abnormalities: normal  Conduction Disutrbances:none  Narrative Interpretation:   Old EKG Reviewed: none available   MDM  No diagnosis found. Patient's chest x-ray here without infiltrates but suspect that patient has bronchitis. He was given albuterol treatment along with prednisone and feels better. Wheezing is improved. He'll be placed on albuterol, prednisone, Z-Pak.    Leota Jacobsen, MD 04/10/13 2010

## 2013-05-17 HISTORY — PX: CORNEAL TRANSPLANT: SHX108

## 2013-11-22 ENCOUNTER — Other Ambulatory Visit: Payer: Self-pay | Admitting: Rehabilitation

## 2013-11-22 DIAGNOSIS — M545 Low back pain, unspecified: Secondary | ICD-10-CM

## 2013-12-14 ENCOUNTER — Ambulatory Visit
Admission: RE | Admit: 2013-12-14 | Discharge: 2013-12-14 | Disposition: A | Discharge status: Home / Self Care | Payer: Medicare Other | Source: Ambulatory Visit | Attending: Rehabilitation | Admitting: Rehabilitation

## 2013-12-14 DIAGNOSIS — M545 Low back pain, unspecified: Secondary | ICD-10-CM

## 2013-12-14 MED ORDER — GADOBENATE DIMEGLUMINE 529 MG/ML IV SOLN: 15.0000 mL | Freq: Once | INTRAVENOUS | Status: AC | PRN

## 2015-01-27 ENCOUNTER — Emergency Department (HOSPITAL_BASED_OUTPATIENT_CLINIC_OR_DEPARTMENT_OTHER): Payer: Medicare Other

## 2015-01-27 ENCOUNTER — Encounter (HOSPITAL_BASED_OUTPATIENT_CLINIC_OR_DEPARTMENT_OTHER): Payer: Self-pay | Admitting: *Deleted

## 2015-01-27 ENCOUNTER — Emergency Department (HOSPITAL_BASED_OUTPATIENT_CLINIC_OR_DEPARTMENT_OTHER)
Admission: EM | Admit: 2015-01-27 | Discharge: 2015-01-27 | Disposition: A | Discharge status: Home / Self Care | Payer: Medicare Other | Attending: Emergency Medicine | Admitting: Emergency Medicine

## 2015-01-27 DIAGNOSIS — G8929 Other chronic pain: Secondary | ICD-10-CM | POA: Insufficient documentation

## 2015-01-27 DIAGNOSIS — Z79899 Other long term (current) drug therapy: Secondary | ICD-10-CM | POA: Diagnosis not present

## 2015-01-27 DIAGNOSIS — Y998 Other external cause status: Secondary | ICD-10-CM | POA: Diagnosis not present

## 2015-01-27 DIAGNOSIS — Y9389 Activity, other specified: Secondary | ICD-10-CM | POA: Insufficient documentation

## 2015-01-27 DIAGNOSIS — R200 Anesthesia of skin: Secondary | ICD-10-CM | POA: Diagnosis not present

## 2015-01-27 DIAGNOSIS — Z8669 Personal history of other diseases of the nervous system and sense organs: Secondary | ICD-10-CM | POA: Insufficient documentation

## 2015-01-27 DIAGNOSIS — Z86018 Personal history of other benign neoplasm: Secondary | ICD-10-CM | POA: Diagnosis not present

## 2015-01-27 DIAGNOSIS — Y92410 Unspecified street and highway as the place of occurrence of the external cause: Secondary | ICD-10-CM | POA: Insufficient documentation

## 2015-01-27 DIAGNOSIS — Z7952 Long term (current) use of systemic steroids: Secondary | ICD-10-CM | POA: Insufficient documentation

## 2015-01-27 DIAGNOSIS — S79911A Unspecified injury of right hip, initial encounter: Secondary | ICD-10-CM | POA: Diagnosis present

## 2015-01-27 DIAGNOSIS — E039 Hypothyroidism, unspecified: Secondary | ICD-10-CM | POA: Diagnosis not present

## 2015-01-27 DIAGNOSIS — F419 Anxiety disorder, unspecified: Secondary | ICD-10-CM | POA: Diagnosis not present

## 2015-01-27 DIAGNOSIS — M5431 Sciatica, right side: Secondary | ICD-10-CM | POA: Diagnosis not present

## 2015-01-27 DIAGNOSIS — Z8719 Personal history of other diseases of the digestive system: Secondary | ICD-10-CM | POA: Insufficient documentation

## 2015-01-27 DIAGNOSIS — M1990 Unspecified osteoarthritis, unspecified site: Secondary | ICD-10-CM | POA: Diagnosis not present

## 2015-01-27 MED ORDER — PREDNISONE 20 MG PO TABS: ORAL_TABLET | ORAL | Status: DC

## 2015-01-27 MED ORDER — OXYCODONE-ACETAMINOPHEN 5-325 MG PO TABS
1.0000 | ORAL_TABLET | Freq: Once | ORAL | Status: AC
  Administered 2015-01-27: 1 via ORAL
  Filled 2015-01-27: qty 1

## 2015-01-27 NOTE — ED Notes (Signed)
Pt has hx of back pain. Since Thursday has increased pain in right hip, radiating to right foot with numbness. Pt reports pain worse with ambulating, using cane to assist. Pt's family interpretating

## 2015-01-27 NOTE — ED Provider Notes (Signed)
CSN: [id number]     Arrival date & time 01/27/15  1119 History   First MD Initiated Contact with Patient 01/27/15 1136     Chief Complaint  Patient presents with  . Hip Pain     (Consider location/radiation/quality/duration/timing/severity/associated sxs/prior Treatment) HPI  81 year old male presents with right hip/buttocks pain that started 2 days ago. Started when he was driving and slammed on breaks. Immediately felt pain near his hip. Has trouble ambulating due to the severity of pain. Has chronic low back pain is worse on the right with different stenosis and herniated disks. States this feels different. When he stands up to walk pain radiates down his whole leg with occasional numbness on the outside. No weakness, his gait is limited by pain only. No abdominal pain. No urinary symptoms. No direct trauma. Has been taking his typical home medicines which include Zanaflex, oxycodone, and gabapentin but still has severe pain. At rest the pain is minimal.  Past Medical History  Diagnosis Date  . Anxiety   . Insomnia   . Hyperlipidemia   . Reflux   . OSA (obstructive sleep apnea)   . Epigastric rebound abdominal tenderness   . Benign pigmented nevus   . Cataract     LEFT EYE  . Depression   . Esophageal reflux   . Headache(784.0)   . Hearing loss   . Hemorrhoids   . Hiatal hernia   . Hypothyroidism   . Disc degeneration, lumbar   . Osteoarthritis    Past Surgical History  Procedure Laterality Date  . Back surgery      L4-L5  . Cataract surgery      WITH CORNEA COMOPLICATIONS NOW ON S/P TRANSPLANT X 3 ON RIGHT EYE  . Cholecystectomy    . Corneal transplant      X3 TRANSPLANT AT [facility] CATARACT SURGERY COMPLICATIONS   Family History  Problem Relation Age of Onset  . Cancer Brother   . Brain cancer Sister    Social History  Substance Use Topics  . Smoking status: Never Smoker   . Smokeless tobacco: Never Used  . Alcohol Use: No    Review of Systems    Constitutional: Negative for fever.  Gastrointestinal: Negative for vomiting and abdominal pain.  Genitourinary: Negative for dysuria and hematuria.  Musculoskeletal: Positive for back pain and arthralgias.  Neurological: Positive for numbness. Negative for weakness.  All other systems reviewed and are negative.     Allergies  Review of patient's allergies indicates no known allergies.  Home Medications   Prior to Admission medications   Medication Sig Start Date End Date Taking? Authorizing Provider  gabapentin (NEURONTIN) 300 MG capsule Take 300 mg by mouth daily.   Yes Historical Provider, MD  levothyroxine (SYNTHROID, LEVOTHROID) 100 MCG tablet Take 100 mcg by mouth daily before breakfast.   Yes Historical Provider, MD  oxyCODONE-acetaminophen (PERCOCET/ROXICET) 5-325 MG tablet Take 1 tablet by mouth every 4 (four) hours as needed for severe pain.   Yes Historical Provider, MD  tizanidine (ZANAFLEX) 2 MG capsule Take 2 mg by mouth daily.   Yes Historical Provider, MD  albuterol (PROVENTIL HFA;VENTOLIN HFA) 108 (90 BASE) MCG/ACT inhaler Inhale 2 puffs into the lungs every 6 (six) hours as needed for wheezing or shortness of breath. 04/10/13   Lacretia Leigh, MD  azithromycin (ZITHROMAX Z-PAK) 250 MG tablet 2 by mouth daily x1 then 1 by mouth daily x4 days 04/10/13   Lacretia Leigh, MD  predniSONE (DELTASONE) 10 MG tablet  Take 2 tablets (20 mg total) by mouth daily. 04/10/13   Lacretia Leigh, MD   BP 171/98 mmHg  Pulse 84  Temp(Src) 98 F (36.7 C) (Oral)  Resp 18  Ht 5\' 5"  (1.651 m)  Wt 168 lb (76.204 kg)  BMI 27.96 kg/m2  SpO2 97% Physical Exam  Constitutional: He is oriented to person, place, and time. He appears well-developed and well-nourished. No distress.  HENT:  Head: Normocephalic and atraumatic.  Right Ear: External ear normal.  Left Ear: External ear normal.  Nose: Nose normal.  Eyes: Right eye exhibits no discharge. Left eye exhibits no discharge.  Neck: Neck  supple.  Cardiovascular: Normal rate, regular rhythm, normal heart sounds and intact distal pulses.   Pulmonary/Chest: Effort normal and breath sounds normal.  Abdominal: Soft. He exhibits no distension. There is no tenderness.  Musculoskeletal: He exhibits no edema.       Right hip: He exhibits normal range of motion and no tenderness.       Legs: Neurological: He is alert and oriented to person, place, and time.  Reflex Scores:      Patellar reflexes are 2+ on the right side and 2+ on the left side.      Achilles reflexes are 2+ on the right side and 2+ on the left side. 5/5 strength in both lower extremities. Grossly normal sensation  Skin: Skin is warm and dry. He is not diaphoretic.  Nursing note and vitals reviewed.   ED Course  Procedures (including critical care time) Labs Review Labs Reviewed - No data to display  Imaging Review Dg Hip Unilat With Pelvis 2-3 Views Right  01/27/2015  CLINICAL DATA:  RIGHT hip pain, posterior side x2 days after sudden stop while driving. Pain runs down RIGHT leg to foot. Had good ROM during exam, EXAM: DG HIP (WITH OR WITHOUT PELVIS) 2-3V RIGHT COMPARISON:  None. FINDINGS: There is no evidence of hip fracture or dislocation. There is no evidence of arthropathy or other focal bone abnormality. Degenerative disc disease noted L3-L5. IMPRESSION: 1. Negative right hip. 2. Lumbar degenerative disc disease . Electronically Signed   By: Lucrezia Europe M.D.   On: 01/27/2015 12:30   I have personally reviewed and evaluated these images and lab results as part of my medical decision-making.   EKG Interpretation None      MDM   Final diagnoses:  Right sided sciatica    Given pain localized to the hip and x-ray was obtained but this is negative. Most likely this is from his sciatic nerve. He is neurologically intact. He is able to ambulate although it hurts significantly. He is on multiple medicines, after discussion with patient he would like to try a  steroid Dosepak. I discussed the risks and benefits as well as minimal evidence. Patient already has a PCP appointment in 5 days. Discussed strict return precautions with this point I see no signs of acute spinal emergency.    Sherwood Gambler, MD 01/27/15 [phone]

## 2015-01-27 NOTE — Discharge Instructions (Signed)
Ejercicios para la espalda (Back Exercises) Los siguientes ejercicios fortalecen los msculos que dan soporte a la espalda y, [place], ayudan a Theatre manager la flexibilidad de la zona lumbar. Hacer estos ejercicios puede ser de ayuda para Information systems manager de espalda o Best boy actual. Si tiene dolor o molestias en la espalda, intente hacer estos ejercicios 2 o 3veces por da, o como se lo haya indicado el mdico. Cuando el dolor desaparezca, hgalos una vez por da, pero haga ms repeticiones de cada ejercicio. Si no tiene dolor o [employer] espalda, haga estos ejercicios una vez por da o como se lo haya indicado el mdico. EJERCICIOS Rodilla al pecho Repita estos pasos 3 o 5veces con cada pierna:  Acustese boca arriba sobre una cama dura o sobre el suelo con las piernas extendidas.  Lleve una rodilla al pecho. La otra pierna debe quedar extendida y en contacto con el suelo.  Mantenga la rodilla contra el pecho. Para lograrlo tmese la rodilla o el muslo.  Tire de la rodilla hasta sentir una elongacin suave en la parte baja de la espalda.  Mantenga la elongacin durante 10 a 30segundos.  Suelte y extienda la pierna lentamente. Inclinacin de la pelvis Repita estos pasos 5 o 10veces:  Acustese boca arriba sobre una cama dura o sobre el suelo con las piernas extendidas.  Flexione las rodillas de modo que apunten al techo y los pies queden apoyados en el suelo.  Contraiga los msculos de la parte baja del abdomen para empujar la zona lumbar contra el suelo. Con este movimiento se inclinar la pelvis de modo que el cccix apunte hacia el techo, en lugar de apuntar en direccin a los pies o al suelo.  Contraiga suavemente y respire con normalidad mientras mantiene esta posicin durante 5 a 10segundos. El perro y el gato Repita estos pasos hasta que la zona lumbar se vuelva ms flexible:  [place] manos y las rodillas sobre una superficie firme. Las manos  deben estar alineadas con los hombros y las rodillas con las caderas. Puede colocarse almohadillas debajo de las rodillas para estar cmodo.  Deje caer la cabeza y baje el cccix en direccin al suelo de modo que la zona lumbar se arquee como el lomo de un [place].  Mantenga esta posicin durante 5segundos.  Lentamente, levante la cabeza y eleve el cccix de modo que apunte en direccin al techo para que la espalda forme un arco hundido como el lomo de un perro contento.  Mantenga esta posicin durante 5segundos. Flexiones de English as a second language teacher pasos 5 o 10veces:  Acustese boca abajo en el suelo.  [place] manos cerca de la cabeza, separadas aproximadamente al ancho de los hombros.  Con la espalda lo ms relajada posible y las caderas apoyadas en el suelo, extienda lentamente los brazos para levantar la mitad superior del cuerpo y Community education officer los hombros. No use los msculos de la espalda para elevar la parte superior del torso. Puede cambiar las manos de lugar para estar ms cmodo.  Mantenga esta posicin durante 5segundos mientras mantiene la espalda relajada.  Lentamente vuelva a la posicin horizontal. Puentes Repita estos pasos 10veces:  Acustese boca arriba sobre una superficie firme.  Flexione las rodillas de modo que apunten al techo y los pies queden apoyados en el suelo.  Contraiga los glteos y despegue las nalgas del suelo hasta que la cintura est casi a la misma altura que las rodillas. Debe  sentir el trabajo muscular en las nalgas y la parte posterior de los muslos. Si no siente el esfuerzo de [employer], aleje los pies 1 o 2pulgadas (2,5 o 5centmetros) de las nalgas.  Mantenga esta posicin durante 3 a 5segundos.  Baje lentamente las caderas a la posicin inicial y relaje los glteos por completo. Si este ejercicio le resulta muy fcil, intente realizarlo con los brazos cruzados [place]. Abdominales Repita estos pasos 5 o  10veces:  Acustese boca arriba sobre una cama dura o sobre el suelo con las piernas extendidas.  Flexione las rodillas de modo que apunten al techo y los pies queden apoyados en el suelo.  Cruce los [employer].  Baje levemente el mentn en direccin al pecho sin doblar el cuello.  Contraiga los msculos del abdomen y con lentitud eleve el torso lo suficiente como para Administrator los omplatos del suelo. No eleve el torso ms alto que eso, porque esto puede sobreexigir a la zona lumbar y no ayuda a Lobbyist.  Regrese lentamente a la posicin inicial. Elevaciones de espalda Repita estos pasos 5 o 10veces:  Acustese boca abajo con los brazos a los costados del cuerpo y apoye la frente en el suelo.  Contraiga los msculos de las piernas y los glteos.  Lentamente despegue el pecho del suelo mientras mantiene las caderas bien apoyadas en el suelo. Mantenga la nuca alineada con la curvatura de la espalda. Los ojos deben mirar al suelo.  Mantenga esta posicin durante 3 a 5segundos.  Regrese lentamente a la posicin inicial. SOLICITE ATENCIN MDICA SI:  El dolor o las molestias en la espalda se vuelven mucho ms intensos cuando hace un ejercicio.  El dolor o las molestias en la espalda no se Runner, broadcasting/film/video en el trmino de las 2horas posteriores a Therapist, art. Si tiene alguno de [employer], deje de [employer] ejercicios de inmediato. No vuelva a hacer los ejercicios a menos que el mdico lo autorice. SOLICITE ATENCIN MDICA DE INMEDIATO SI:  Siente un dolor sbito e intenso en la espalda. Si esto ocurre, deje de [employer] ejercicios de inmediato. No vuelva a hacer los ejercicios a menos que el mdico lo autorice.   Esta informacin no tiene Marine scientist el consejo del mdico. Asegrese de hacerle al mdico cualquier pregunta que tenga.   Document Released: 03/17/2005 Document Revised: 12/06/2014 Elsevier Interactive  Patient Education 2016 [name]  (Sciatica)  La citica es el dolor, debilidad, entumecimiento u hormigueo a lo largo del nervio citico. El nervio comienza en la zona inferior de la espalda y desciende por la parte posterior de cada pierna. El nervio controla los msculos de la parte inferior de la pierna y de la zona posterior de la rodilla, y transmite la sensibilidad a la parte posterior del muslo, la pierna y la planta del pie. La citica es un sntoma de otras afecciones mdicas. Por ejemplo, un dao a los nervios o algunas enfermedades como un disco herniado o un espoln seo en la columna vertebral, podran daarle o presionar en el nervio citico. Esto causa dolor, debilidad y otras sensaciones normalmente asociadas con la citica. Generalmente la citica afecta slo un lado del cuerpo. CAUSAS   Disco herniado o desplazado.  Enfermedad degenerativa del disco.  Un sndrome doloroso que compromete un msculo angosto de los glteos (sndrome piriforme).  Lesin o fractura plvica.  Embarazo.  Tumor (casos raros). SNTOMAS  Los sntomas pueden variar de leves a muy graves. Por lo general, los sntomas descienden desde la zona lumbar a las nalgas y la parte posterior de la pierna. Ellos son:   Hormigueo leve o dolor sordo en la parte inferior de la espalda, la pierna o la cadera.  Adormecimiento en la parte posterior de la pantorrilla o la planta del pie.  Sensacin de [employer] zona lumbar, la pierna o la cadera.  Dolor agudo en la zona inferior de la espalda, la pierna o la cadera.  Debilidad en las piernas.  Dolor de espalda intenso que [employer] movimientos. Los sntomas pueden empeorar al toser, Brewing technologist, rer o estar sentado o parado durante [employer]. Adems, el sobrepeso puede empeorar los sntomas.  DIAGNSTICO  Su mdico le har un examen fsico para buscar los sntomas comunes de la citica. Le pedir que haga algunos movimientos o  actividades que activaran el dolor del nervio citico. Para encontrar las causas de la citica podr indicarle otros estudios. Estos pueden ser:   Anlisis de [place].  Radiografas.  Pruebas de diagnstico por imgenes, como resonancia magntica o tomografa computada. TRATAMIENTO  El tratamiento se dirige a las causas de la citica. A veces, el tratamiento no es necesario, y Conservation officer, historic buildings y Health and safety inspector desaparecen por s mismos. Si necesita tratamiento, su mdico puede sugerir:   Medicamentos de venta libre para Best boy.  Medicamentos recetados, como antiinflamatorios, relajantes musculares o narcticos.  Aplicacin de calor o hielo en la zona del dolor.  Inyecciones de corticoides para disminuir el dolor, la irritacin y la inflamacin alrededor del nervio.  Reduccin de la [employer] perodos de [place].  Ejercicios y estiramiento del abdomen para fortalecer y Teacher, English as a foreign language la flexibilidad de la columna vertebral. Su mdico puede sugerirle perder peso si el peso extra empeora el dolor de espalda.  Fisioterapia.  La ciruga para eliminar lo que presiona o pincha el nervio, como un espoln seo o parte de una hernia de disco. INSTRUCCIONES PARA EL CUIDADO EN EL HOGAR   Slo tome medicamentos de venta libre o recetados para Glass blower/designer o Health and safety inspector, segn las indicaciones de su mdico.  Aplique hielo sobre el rea dolorida durante 20 minutos 3-4 veces por da durante los primeras 48-72 horas. Luego intente aplicar calor de la misma manera.  Haga ejercicios, elongue o realice sus actividades habituales, si no le causan ms dolor.  Cumpla con todas las sesiones de fisioterapia, segn le indique su mdico.  Cumpla con todas las visitas de control, segn le indique su mdico.  No use tacones altos o zapatos que no tengan buen apoyo.  Verifique que el colchn no sea muy blando. Un colchn firme Best boy y las [place]. SOLICITE ATENCIN MDICA DE INMEDIATO SI:    Pierde el control de la vejiga o del intestino (incontinencia).  Aumenta la debilidad en la zona inferior de la espalda, la pelvis, las nalgas o las piernas.  Siente irritacin o inflamacin en la espalda.  Tiene sensacin de ardor al [employer].  El dolor empeora cuando se acuesta o lo despierta por la noche.  El dolor es peor del que experiment en el pasado.  Dura ms de 4 semanas.  Pierde peso sin motivo de [place] sbita. ASEGRESE DE QUE:   Comprende estas instrucciones.  Controlar su enfermedad.  Solicitar ayuda de inmediato si no mejora o si empeora.   Esta informacin no tiene Marine scientist el consejo del mdico. Asegrese de  hacerle al mdico cualquier pregunta que tenga.   Document Released: 03/17/2005 Document Revised: 12/06/2014 Elsevier Interactive Patient Education [employer].

## 2015-06-04 ENCOUNTER — Emergency Department (HOSPITAL_COMMUNITY)
Admission: EM | Admit: 2015-06-04 | Discharge: 2015-06-04 | Disposition: A | Discharge status: Home / Self Care | Payer: Medicare Other | Attending: Emergency Medicine | Admitting: Emergency Medicine

## 2015-06-04 ENCOUNTER — Encounter (HOSPITAL_COMMUNITY): Payer: Self-pay | Admitting: Emergency Medicine

## 2015-06-04 DIAGNOSIS — M1990 Unspecified osteoarthritis, unspecified site: Secondary | ICD-10-CM | POA: Diagnosis not present

## 2015-06-04 DIAGNOSIS — Z79899 Other long term (current) drug therapy: Secondary | ICD-10-CM | POA: Insufficient documentation

## 2015-06-04 DIAGNOSIS — Z8719 Personal history of other diseases of the digestive system: Secondary | ICD-10-CM | POA: Insufficient documentation

## 2015-06-04 DIAGNOSIS — Z86018 Personal history of other benign neoplasm: Secondary | ICD-10-CM | POA: Diagnosis not present

## 2015-06-04 DIAGNOSIS — R42 Dizziness and giddiness: Secondary | ICD-10-CM

## 2015-06-04 DIAGNOSIS — Z8659 Personal history of other mental and behavioral disorders: Secondary | ICD-10-CM | POA: Insufficient documentation

## 2015-06-04 DIAGNOSIS — E785 Hyperlipidemia, unspecified: Secondary | ICD-10-CM | POA: Insufficient documentation

## 2015-06-04 DIAGNOSIS — E039 Hypothyroidism, unspecified: Secondary | ICD-10-CM | POA: Insufficient documentation

## 2015-06-04 DIAGNOSIS — H9190 Unspecified hearing loss, unspecified ear: Secondary | ICD-10-CM | POA: Diagnosis not present

## 2015-06-04 LAB — CBC
HCT: 48.2 % (ref 39.0–52.0)
Hemoglobin: 17.1 g/dL — ABNORMAL HIGH (ref 13.0–17.0)
MCH: 32.6 pg (ref 26.0–34.0)
MCHC: 35.5 g/dL (ref 30.0–36.0)
MCV: 91.8 fL (ref 78.0–100.0)
Platelets: 187 10*3/uL (ref 150–400)
RBC: 5.25 MIL/uL (ref 4.22–5.81)
RDW: 12.7 % (ref 11.5–15.5)
WBC: 6.1 10*3/uL (ref 4.0–10.5)

## 2015-06-04 LAB — CBG MONITORING, ED: Glucose-Capillary: 106 mg/dL — ABNORMAL HIGH (ref 65–99)

## 2015-06-04 LAB — BASIC METABOLIC PANEL
Anion gap: 3 — ABNORMAL LOW (ref 5–15)
BUN: 17 mg/dL (ref 6–20)
CO2: 25 mmol/L (ref 22–32)
Calcium: 9.3 mg/dL (ref 8.9–10.3)
Chloride: 105 mmol/L (ref 101–111)
Creatinine, Ser: 1.13 mg/dL (ref 0.61–1.24)
GFR calc Af Amer: >60 mL/min (ref >60)
GFR calc non Af Amer: 59 mL/min — ABNORMAL LOW (ref >60)
Glucose, Bld: 95 mg/dL (ref 65–99)
Potassium: 4.3 mmol/L (ref 3.5–5.1)
Sodium: 133 mmol/L — ABNORMAL LOW (ref 135–145)

## 2015-06-04 MED ORDER — MECLIZINE HCL 25 MG PO TABS
25.0000 mg | ORAL_TABLET | Freq: Once | ORAL | Status: AC
  Administered 2015-06-04: 25 mg via ORAL
  Filled 2015-06-04: qty 1

## 2015-06-04 MED ORDER — MECLIZINE HCL 25 MG PO TABS: 25.0000 mg | ORAL_TABLET | Freq: Three times a day (TID) | ORAL | Status: DC | PRN

## 2015-06-04 NOTE — ED Notes (Signed)
Patient states that he recently had injections in his back and that every since he has been having problems with dizziness.  Patient states that the dizziness does not get better or worse with standing, sitting or laying.

## 2015-06-04 NOTE — ED Provider Notes (Signed)
CSN: [id number]     Arrival date & time 06/04/15  0844 History   First MD Initiated Contact with Patient 06/04/15 [phone]     Chief Complaint  Patient presents with  . Dizziness     (Consider location/radiation/quality/duration/timing/severity/associated sxs/prior Treatment) HPI Comments: 82 year old male with past medical history below who presents with dizziness. History obtained with the assistance of the patient's son. The patient had an epidural steroid injection 6 days ago. On the same day, he began having episodes of dizziness which he describes as room spinning sensation that occur while he is laying in bed and rolling to one side, sitting up from bed, or bending forward with his head. Son states that he has had this problem previously, approximately one year ago, for which she did some exercises at home and the problem improved. He has intermittently had headaches with the dizziness but denies any headache currently. No visual changes. No extremity numbness/weakness. No fevers. He does report some leg numbness after the injection which has improved throughout the week. He denies any bowel/bladder incontinence.  Patient is a 82 y.o. male presenting with dizziness. The history is provided by the patient.  Dizziness   Past Medical History  Diagnosis Date  . Anxiety   . Insomnia   . Hyperlipidemia   . Reflux   . OSA (obstructive sleep apnea)   . Epigastric rebound abdominal tenderness   . Benign pigmented nevus   . Cataract     LEFT EYE  . Depression   . Esophageal reflux   . Headache(784.0)   . Hearing loss   . Hemorrhoids   . Hiatal hernia   . Hypothyroidism   . Disc degeneration, lumbar   . Osteoarthritis    Past Surgical History  Procedure Laterality Date  . Back surgery      L4-L5  . Cataract surgery      WITH CORNEA COMOPLICATIONS NOW ON S/P TRANSPLANT X 3 ON RIGHT EYE  . Cholecystectomy    . Corneal transplant      X3 TRANSPLANT AT [facility]ACT [facility]GERY  COMPLICATIONS   Family History  Problem Relation Age of Onset  . Cancer Brother   . Brain cancer Sister    Social History  Substance Use Topics  . Smoking status: Never Smoker   . Smokeless tobacco: Never Used  . Alcohol Use: No    Review of Systems  Neurological: Positive for dizziness.   10 Systems reviewed and are negative for acute change except as noted in the HPI.    Allergies  Review of patient's allergies indicates no known allergies.  Home Medications   Prior to Admission medications   Medication Sig Start Date End Date Taking? Authorizing Provider  celecoxib (CELEBREX) 200 MG capsule Take 200 mg by mouth 2 (two) times daily as needed for mild pain.   Yes Historical Provider, MD  fenofibrate 160 MG tablet Take 160 mg by mouth daily.   Yes Historical Provider, MD  levothyroxine (SYNTHROID, LEVOTHROID) 100 MCG tablet Take 100 mcg by mouth daily before breakfast.   Yes Historical Provider, MD  oxyCODONE-acetaminophen (PERCOCET/ROXICET) 5-325 MG tablet Take 1 tablet by mouth every 4 (four) hours as needed for severe pain.   Yes Historical Provider, MD  pregabalin (LYRICA) 50 MG capsule Take 50 mg by mouth at bedtime.   Yes Historical Provider, MD  tizanidine (ZANAFLEX) 2 MG capsule Take 2 mg by mouth 2 (two) times daily as needed for muscle spasms.    Yes  Historical Provider, MD  albuterol (PROVENTIL HFA;VENTOLIN HFA) 108 (90 BASE) MCG/ACT inhaler Inhale 2 puffs into the lungs every 6 (six) hours as needed for wheezing or shortness of breath. Patient not taking: Reported on 06/04/2015 04/10/13   Lacretia Leigh, MD  meclizine (ANTIVERT) 25 MG tablet Take 1 tablet (25 mg total) by mouth 3 (three) times daily as needed for dizziness. 06/04/15   Wenda Overland Lenorris Karger, MD   BP 154/104 mmHg  Pulse 89  Temp(Src) 97.9 F (36.6 C) (Oral)  Resp 17  SpO2 99% Physical Exam  Constitutional: He is oriented to person, place, and time. He appears well-developed and well-nourished. No  distress.  Awake, alert  HENT:  Head: Normocephalic and atraumatic.  Eyes: Conjunctivae and EOM are normal. Pupils are equal, round, and reactive to light.  Neck: Neck supple.  Cardiovascular: Normal rate, regular rhythm and normal heart sounds.   No murmur heard. Pulmonary/Chest: Effort normal and breath sounds normal. No respiratory distress.  Abdominal: Soft. Bowel sounds are normal. He exhibits no distension.  Musculoskeletal: He exhibits no edema.  5/5 strength and normal sensation x all 4 ext  Neurological: He is alert and oriented to person, place, and time. He has normal reflexes. No cranial nerve deficit. He exhibits normal muscle tone.  Fluent speech, normal finger-to-nose testing, negative pronator drift  Skin: Skin is warm and dry.  Psychiatric: He has a normal mood and affect. Judgment and thought content normal.  Nursing note and vitals reviewed.   ED Course  Procedures (including critical care time) Labs Review Labs Reviewed  CBC - Abnormal; Notable for the following:    Hemoglobin 17.1 (*)    All other components within normal limits  BASIC METABOLIC PANEL - Abnormal; Notable for the following:    Sodium 133 (*)    GFR calc non Af Amer 59 (*)    Anion gap 3 (*)    All other components within normal limits  CBG MONITORING, ED - Abnormal; Notable for the following:    Glucose-Capillary 106 (*)    All other components within normal limits    Imaging Review No results found. I have personally reviewed and evaluated these lab results as part of my medical decision-making.   EKG Interpretation   Date/Time:  Monday June 04 2015 08:55:52 EST Ventricular Rate:  71 PR Interval:  201 QRS Duration: 87 QT Interval:  423 QTC Calculation: 460 R Axis:   14 Text Interpretation:  Sinus rhythm Baseline wander in lead(s) I III aVL No  significant change since last tracing Confirmed by English Tomer MD, Luella Gardenhire  [phone]) on 06/04/2015 9:27:19 AM     Medications  meclizine  (ANTIVERT) tablet 25 mg (25 mg Oral Given 06/04/15 1042)    MDM   Final diagnoses:  Vertigo   PT p/w several days of dizziness symptoms That are worse when rolling over in bed or bending forward. On exam, he was well-appearing with reassuring vital signs. EKG unremarkable. I performed an Epley maneuver at bedside which produced horizontal nystagmus and patient endorsed the same symptoms. After he sat up, pt's symptoms improved. Gave meclizine. the patient has had these symptoms before and they are reproducible with Epley maneuver, I suspect a benign cause for his vertigo. The rest of his neurologic exam is normal thus I doubt intracranial process. Pt felt better in ED after meclizine Provided with meclizine Rx and instructions on PCP follow-up for referral to ENT if Epley maneuvers at home fail to improve his symptoms. Return  precautions reviewed and patient voiced understanding. Patient discharged in satisfactory condition   Sharlett Iles, MD 06/04/15 1239

## 2015-06-04 NOTE — Discharge Instructions (Signed)
You may take MELATONIN for help with sleep.   Mareos (Dizziness) Los mareos son un problema muy frecuente. Se trata de una sensacin de inestabilidad o de desvanecimiento. Puede sentir que se va a desmayar. Un mareo puede provocarle una lesin si se tropieza o se cae. Las Engineer, manufacturing de todas las edades pueden sufrir Tree surgeon, Armed forces training and education officer es ms frecuente en los adultos [place]. Esta afeccin puede tener muchas causas, entre las que se pueden [employer], la deshidratacin y [place]. INSTRUCCIONES PARA EL CUIDADO EN EL HOGAR Estas indicaciones pueden ayudarlo con el trastorno: Comida y bebida  Beba suficiente lquido para Theatre manager la orina clara o de color amarillo plido. Esto evita la deshidratacin. Trate de beber ms lquidos transparentes, como agua.  No beba alcohol.  Limite el consumo de cafena si el mdico se lo indica.  Limite el consumo de sal si el mdico se lo indica. Actividad  Evite los movimientos rpidos.  Levntese de las sillas con lentitud y apyese hasta sentirse bien.  Por la maana, sintese primero a un lado de la cama. Cuando se sienta bien, pngase lentamente de pie mientras se sostiene de algo, hasta que sepa que ha logrado el equilibrio.  Mueva las piernas con frecuencia si debe estar de pie en un lugar durante mucho tiempo. Mientras est de pie, contraiga y relaje los msculos de las piernas.  No conduzca vehculos ni opere maquinaria pesada si se siente mareado.  Evite agacharse si se siente mareado. En su casa, coloque los objetos de modo que le resulte fcil alcanzarlos sin Office manager. Estilo de vida  No consuma ningn producto que contenga tabaco, lo que incluye cigarrillos, tabaco de Higher education careers adviser o Psychologist, sport and exercise. Si necesita ayuda para dejar de fumar, consulte al [employer].  Trate de reducir el nivel de estrs practicando actividades como el yoga o la meditacin. Hable con el mdico si necesita ayuda. Instrucciones generales  Controle  sus mareos para ver si hay cambios.  Tome los medicamentos solamente como se lo haya indicado el mdico. Hable con el mdico si cree que los medicamentos que est tomando son la causa de sus mareos.  Infrmele a un amigo o a un familiar si se siente mareado. Pdale a esta persona que llame al mdico si observa cambios en su comportamiento.  Concurra a todas las visitas de control como se lo haya indicado el mdico. Esto es importante. SOLICITE ATENCIN MDICA SI:  Los [employer].  Los [employer] o la sensacin de Engineer, petroleum.  Siente nuseas.  Ha perdido la audicin.  Aparecen nuevos sntomas.  Cuando est de pie se siente inestable o que la habitacin da vueltas. SOLICITE ATENCIN MDICA DE INMEDIATO SI:  Vomita o tiene diarrea y no puede comer ni beber nada.  Tiene dificultad para hablar, para caminar, para tragar o para [employer], las Berry piernas.  Siente una debilidad generalizada.  No piensa con claridad o tiene dificultades para armar oraciones. Es posible que un amigo o un familiar adviertan que esto ocurre.  Tiene dolor de pecho, dolor abdominal, sudoracin o Risk manager.  Hay cambios en la visin.  Observa un sangrado.  Tiene dolores de [place].  Tiene dolor o rigidez en el cuello.  Tiene fiebre.   Esta informacin no tiene Marine scientist el consejo del mdico. Asegrese de hacerle al mdico cualquier pregunta que tenga.   Document Released: 03/17/2005 Document Revised: 08/01/2014 Elsevier Interactive Patient Education [employer].

## 2015-06-04 NOTE — ED Notes (Addendum)
Pt complaint of dizziness, headache, and unsteady gait post receiving steroid IM injection Wednesday for lower back pain; denies headache or other pain at present time.

## 2015-06-04 NOTE — ED Notes (Signed)
Patient is alert and oriented x3.  He was given DC instructions and follow up visit instructions.  Patient gave verbal understanding.  He was DC ambulatory under his own power to home.  V/S stable.  He was not showing any signs of distress on DC 

## 2015-06-04 NOTE — ED Notes (Addendum)
Little EDP aware of pt status and complaint.

## 2015-06-22 ENCOUNTER — Encounter (HOSPITAL_COMMUNITY): Payer: Self-pay | Admitting: Emergency Medicine

## 2015-06-22 ENCOUNTER — Emergency Department (HOSPITAL_COMMUNITY): Payer: Medicare Other

## 2015-06-22 ENCOUNTER — Emergency Department (HOSPITAL_COMMUNITY)
Admission: EM | Admit: 2015-06-22 | Discharge: 2015-06-22 | Disposition: A | Discharge status: Home / Self Care | Payer: Medicare Other | Attending: Emergency Medicine | Admitting: Emergency Medicine

## 2015-06-22 DIAGNOSIS — Z8659 Personal history of other mental and behavioral disorders: Secondary | ICD-10-CM | POA: Insufficient documentation

## 2015-06-22 DIAGNOSIS — Z791 Long term (current) use of non-steroidal anti-inflammatories (NSAID): Secondary | ICD-10-CM | POA: Insufficient documentation

## 2015-06-22 DIAGNOSIS — Z79899 Other long term (current) drug therapy: Secondary | ICD-10-CM | POA: Insufficient documentation

## 2015-06-22 DIAGNOSIS — E785 Hyperlipidemia, unspecified: Secondary | ICD-10-CM | POA: Diagnosis not present

## 2015-06-22 DIAGNOSIS — M1990 Unspecified osteoarthritis, unspecified site: Secondary | ICD-10-CM | POA: Insufficient documentation

## 2015-06-22 DIAGNOSIS — H9190 Unspecified hearing loss, unspecified ear: Secondary | ICD-10-CM | POA: Diagnosis not present

## 2015-06-22 DIAGNOSIS — Z9842 Cataract extraction status, left eye: Secondary | ICD-10-CM | POA: Insufficient documentation

## 2015-06-22 DIAGNOSIS — R55 Syncope and collapse: Secondary | ICD-10-CM | POA: Insufficient documentation

## 2015-06-22 DIAGNOSIS — R42 Dizziness and giddiness: Secondary | ICD-10-CM | POA: Diagnosis not present

## 2015-06-22 DIAGNOSIS — Z86018 Personal history of other benign neoplasm: Secondary | ICD-10-CM | POA: Diagnosis not present

## 2015-06-22 DIAGNOSIS — Z8719 Personal history of other diseases of the digestive system: Secondary | ICD-10-CM | POA: Diagnosis not present

## 2015-06-22 DIAGNOSIS — E039 Hypothyroidism, unspecified: Secondary | ICD-10-CM | POA: Insufficient documentation

## 2015-06-22 LAB — BASIC METABOLIC PANEL
Anion gap: 8 (ref 5–15)
BUN: 17 mg/dL (ref 6–20)
CO2: 26 mmol/L (ref 22–32)
Calcium: 9.6 mg/dL (ref 8.9–10.3)
Chloride: 107 mmol/L (ref 101–111)
Creatinine, Ser: 1.3 mg/dL — ABNORMAL HIGH (ref 0.61–1.24)
GFR calc Af Amer: 58 mL/min — ABNORMAL LOW (ref >60)
GFR calc non Af Amer: 50 mL/min — ABNORMAL LOW (ref >60)
Glucose, Bld: 103 mg/dL — ABNORMAL HIGH (ref 65–99)
Potassium: 3.7 mmol/L (ref 3.5–5.1)
Sodium: 141 mmol/L (ref 135–145)

## 2015-06-22 LAB — I-STAT TROPONIN, ED: Troponin i, poc: 0.01 ng/mL (ref 0.00–0.08)

## 2015-06-22 LAB — CBC
HCT: 48.4 % (ref 39.0–52.0)
Hemoglobin: 17.4 g/dL — ABNORMAL HIGH (ref 13.0–17.0)
MCH: 31.9 pg (ref 26.0–34.0)
MCHC: 36 g/dL (ref 30.0–36.0)
MCV: 88.8 fL (ref 78.0–100.0)
Platelets: 192 10*3/uL (ref 150–400)
RBC: 5.45 MIL/uL (ref 4.22–5.81)
RDW: 13.1 % (ref 11.5–15.5)
WBC: 8.5 10*3/uL (ref 4.0–10.5)

## 2015-06-22 LAB — CBG MONITORING, ED: Glucose-Capillary: 94 mg/dL (ref 65–99)

## 2015-06-22 MED ORDER — MECLIZINE HCL 25 MG PO TABS: 25.0000 mg | ORAL_TABLET | Freq: Three times a day (TID) | ORAL | Status: DC | PRN

## 2015-06-22 MED ORDER — GADOBENATE DIMEGLUMINE 529 MG/ML IV SOLN
15.0000 mL | Freq: Once | INTRAVENOUS | Status: AC | PRN
  Administered 2015-06-22: 15 mL via INTRAVENOUS

## 2015-06-22 MED ORDER — MECLIZINE HCL 25 MG PO TABS
25.0000 mg | ORAL_TABLET | Freq: Once | ORAL | Status: AC
  Administered 2015-06-22: 25 mg via ORAL
  Filled 2015-06-22: qty 1

## 2015-06-22 NOTE — ED Notes (Signed)
Patient ambulated in the hall approximately 50 feet without assistance.

## 2015-06-22 NOTE — ED Notes (Signed)
Bed: WA01 Expected date:  Expected time:  Means of arrival:  Comments: Triage 2- [place]

## 2015-06-22 NOTE — ED Provider Notes (Signed)
CSN: [id number]     Arrival date & time 06/22/15  [id number] History   First MD Initiated Contact with Patient 06/22/15 [phone]     Chief Complaint  Patient presents with  . Near Syncope     (Consider location/radiation/quality/duration/timing/severity/associated sxs/prior Treatment) HPI Comments: Patient presents with dizziness. He reports a 2 to three-week history of vertigo-type symptoms. He describes a spinning sensation. He states it's worse when he is in bed and turns from side to side. He has had associated headaches both in the front of his head and to the back of his head. He denies any recent falls or head trauma. He was seen here on March 6 for similar symptoms. He states his dizziness had improved after the ED visit. He was given prescription for meclizine but discuss this with his PCP. His PCP advised him to hold off on the meclizine and referred him to physical therapy for the vertigo. He states with the physical therapy he initially felt better but now is having worsening symptoms. He reports that the room is spinning. He's having trouble walking due to balance difficulty. He states he feels like he is not able to walk straight. Denies any speech deficits. He does have some blurry vision. He has a history of glaucoma to his right eye. He states he saw his ophthalmologist yesterday and his pressures were normal. He denies any numbness or weakness to his extremities.  Patient is a 82 y.o. male presenting with near-syncope.  Near Syncope Associated symptoms include headaches. Pertinent negatives include no chest pain, no abdominal pain and no shortness of breath.    Past Medical History  Diagnosis Date  . Anxiety   . Insomnia   . Hyperlipidemia   . Reflux   . OSA (obstructive sleep apnea)   . Epigastric rebound abdominal tenderness   . Benign pigmented nevus   . Cataract     LEFT EYE  . Depression   . Esophageal reflux   . Headache(784.0)   . Hearing loss   . Hemorrhoids   .  Hiatal hernia   . Hypothyroidism   . Disc degeneration, lumbar   . Osteoarthritis    Past Surgical History  Procedure Laterality Date  . Back surgery      L4-L5  . Cataract surgery      WITH CORNEA COMOPLICATIONS NOW ON S/P TRANSPLANT X 3 ON RIGHT EYE  . Cholecystectomy    . Corneal transplant      X3 TRANSPLANT AT [facility] CATARACT SURGERY COMPLICATIONS   Family History  Problem Relation Age of Onset  . Cancer Brother   . Brain cancer Sister    Social History  Substance Use Topics  . Smoking status: Never Smoker   . Smokeless tobacco: Never Used  . Alcohol Use: No    Review of Systems  Constitutional: Negative for fever, chills, diaphoresis and fatigue.  HENT: Negative for congestion, rhinorrhea and sneezing.   Eyes: Negative.   Respiratory: Negative for cough, chest tightness and shortness of breath.   Cardiovascular: Positive for near-syncope. Negative for chest pain and leg swelling.  Gastrointestinal: Negative for nausea, vomiting, abdominal pain, diarrhea and blood in stool.  Genitourinary: Negative for frequency, hematuria, flank pain and difficulty urinating.  Musculoskeletal: Negative for back pain and arthralgias.  Skin: Negative for rash.  Neurological: Positive for dizziness and headaches. Negative for speech difficulty, weakness and numbness.      Allergies  Review of patient's allergies indicates no known allergies.  Home  Medications   Prior to Admission medications   Medication Sig Start Date End Date Taking? Authorizing Provider  fenofibrate 160 MG tablet Take 160 mg by mouth daily.   Yes Historical Provider, MD  levothyroxine (SYNTHROID, LEVOTHROID) 100 MCG tablet Take 100 mcg by mouth daily before breakfast.   Yes Historical Provider, MD  prednisoLONE acetate (PRED FORTE) 1 % ophthalmic suspension  05/25/15  Yes Historical Provider, MD  albuterol (PROVENTIL HFA;VENTOLIN HFA) 108 (90 BASE) MCG/ACT inhaler Inhale 2 puffs into the lungs every 6  (six) hours as needed for wheezing or shortness of breath. 04/10/13   Lacretia Leigh, MD  celecoxib (CELEBREX) 200 MG capsule Take 200 mg by mouth 2 (two) times daily as needed for mild pain.    Historical Provider, MD  meclizine (ANTIVERT) 25 MG tablet Take 1 tablet (25 mg total) by mouth 3 (three) times daily as needed for dizziness. 06/22/15   Malvin Johns, MD  oxyCODONE-acetaminophen (PERCOCET/ROXICET) 5-325 MG tablet Take 1 tablet by mouth every 4 (four) hours as needed for severe pain.    Historical Provider, MD  pregabalin (LYRICA) 50 MG capsule Take 50 mg by mouth at bedtime.    Historical Provider, MD  tizanidine (ZANAFLEX) 2 MG capsule Take 2 mg by mouth 2 (two) times daily as needed for muscle spasms.     Historical Provider, MD   BP 160/98 mmHg  Pulse 64  Temp(Src) 97.8 F (36.6 C) (Oral)  Resp 18  SpO2 99% Physical Exam  Constitutional: He is oriented to person, place, and time. He appears well-developed and well-nourished.  HENT:  Head: Normocephalic and atraumatic.  Eyes: Pupils are equal, round, and reactive to light.  Positive horizontal nystagmus on right gaze. No vertical or rotational nystagmus  Neck: Normal range of motion. Neck supple.  Cardiovascular: Normal rate, regular rhythm and normal heart sounds.   Pulmonary/Chest: Effort normal and breath sounds normal. No respiratory distress. He has no wheezes. He has no rales. He exhibits no tenderness.  Abdominal: Soft. Bowel sounds are normal. There is no tenderness. There is no rebound and no guarding.  Musculoskeletal: Normal range of motion. He exhibits no edema.  Lymphadenopathy:    He has no cervical adenopathy.  Neurological: He is alert and oriented to person, place, and time. He has normal strength. No cranial nerve deficit or sensory deficit. GCS eye subscore is 4. GCS verbal subscore is 5. GCS motor subscore is 6.  FTN intact, no pronator drift  Skin: Skin is warm and dry. No rash noted.  Psychiatric: He has a  normal mood and affect.    ED Course  Procedures (including critical care time) Labs Review Labs Reviewed  BASIC METABOLIC PANEL - Abnormal; Notable for the following:    Glucose, Bld 103 (*)    Creatinine, Ser 1.30 (*)    GFR calc non Af Amer 50 (*)    GFR calc Af Amer 58 (*)    All other components within normal limits  CBC - Abnormal; Notable for the following:    Hemoglobin 17.4 (*)    All other components within normal limits  URINALYSIS, ROUTINE W REFLEX MICROSCOPIC (NOT AT [facility])  CBG MONITORING, ED  Randolm Idol, ED    Imaging Review Mr Jeri Cos Wo Contrast  06/22/2015  CLINICAL DATA:  Vertigo.  Headaches and nausea EXAM: MRI HEAD WITHOUT AND WITH CONTRAST TECHNIQUE: Multiplanar, multiecho pulse sequences of the brain and surrounding structures were obtained without and with intravenous contrast. CONTRAST:  33mL  MULTIHANCE GADOBENATE DIMEGLUMINE 529 MG/ML IV SOLN COMPARISON:  CT head 03/22/2009 FINDINGS: Generalized atrophy.  Negative for hydrocephalus. Negative for acute infarct. Mild chronic white matter changes consistent with microvascular ischemia. Brainstem and cerebellum intact. Negative for intracranial hemorrhage. No intracranial mass or edema. No shift of the midline structures Postcontrast imaging reveals normal enhancement. No enhancing mass lesion. Normal vascular enhancement. 6 x 15 mm cyst in the right inferior medial orbit adjacent to the globe. This does not enhance. There has been prior ocular surgery and lens replacement bilaterally. The cyst could be related to prior surgery. Pituitary normal in size. Normal skullbase. Paranasal sinuses clear. IMPRESSION: Atrophy and mild chronic microvascular ischemia. No acute intracranial abnormality. 6 x 15 mm cyst right orbit with a benign appearance. Prior lens replacement bilaterally. Electronically Signed   By: Franchot Gallo M.D.   On: 06/22/2015 12:36   I have personally reviewed and evaluated these images and lab  results as part of my medical decision-making.   EKG Interpretation None     ED ECG REPORT   Date: 06/22/2015  Rate: 70  Rhythm: normal sinus rhythm  QRS Axis: normal  Intervals: normal  ST/T Wave abnormalities: normal  Conduction Disutrbances:none  Narrative Interpretation:   Old EKG Reviewed: none available  I have personally reviewed the EKG tracing and agree with the computerized printout as noted.   MDM   Final diagnoses:  Vertigo    Patient presents with vertigo-like symptoms. This is the second ED visit. He had improvement with physical therapy but then his symptoms worsen again. He's concerned about other etiologies for the vertigo. I did go ahead and do an MRI which did not show any evidence of posterior circulation stroke or mass. He was given dose of meclizine in the ED. He is able to ambulate without problem. He has a follow-up appointment with neurology on Monday. This is in 3 days. He was discharged with a copy of his MRI. He was given a prescription for meclizine to use for symptomatic relief. He states he knows how to do the Epley maneuver at home and will continue trying that for symptom control. He was advised return to the ED if he has any worsening symptoms.    Malvin Johns, MD 06/22/15 1355

## 2015-06-22 NOTE — ED Notes (Signed)
Patient ambulated to restroom, did  Not know we needed a urine specimen as he recently came back from MRI.Family at bedside relayed message to get patient to give a specimen next time he needs to use the bathroom

## 2015-06-22 NOTE — ED Notes (Signed)
Pt c/o dizziness x 3 weeks, nausea, vertigo, headache. No anticoagulant therapy, no antiplatelet therapy, no syncope, no recent head injury. No new symptoms today except for nausea.

## 2015-06-22 NOTE — Discharge Instructions (Signed)
Vértigo  (Vertigo)   Vértigo es la sensación de que se está moviendo estando quieto. Puede ser peligroso si ocurre cuando está trabajado, conduciendo vehículos o realizando actividades difíciles.   CAUSAS   El vértigo se produce cuando hay un conflicto en las señales que se envían al cerebro desde los sistemas visual y sensorial del cuerpo. Hay numerosas causas que originan este problema, entre las que se incluyen:   · Infecciones, especialmente en el oído interno.  · Una mala reacción a un medicamento o mal uso de alcohol y fármacos.  · Abstinencia de drogas o alcohol.  · Cambios rápidos de posición, como al acostarse o darse vuelta en la cama.  · Dolor de cabeza migrañoso.  · Disminución del flujo sanguíneo hacia el cerebro.  · Aumento de la presión en el cerebro por un traumatismo, infección, tumor o sangrado en la cabeza.  SÍNTOMAS   Puede sentir como si el mundo da vueltas o va a caer al piso. Como hay problemas en el equilibrio, el vértigo puede causar náuseas y vómitos. Tiene movimientos oculares involuntarios (nistagmus).   DIAGNÓSTICO   El vértigo normalmente se diagnostica con un examen físico. Si la causa no se conoce, el médico puede indicar diagnóstico por imágenes, como una resonancia magnética (imágenes por resonancia magnética).   TRATAMIENTO   La mayor parte de los casos de vértigo se resuelve sin tratamiento. Según la causa, el médico podrá recetar ciertos medicamentos. Si se relaciona con la posición del cuerpo, podrá recomendarle movimientos o procedimientos para corregir el problema. En algunos casos raros, si la causa del vértigo es un problema en el oído interno, necesitará una cirugía.   INSTRUCCIONES PARA EL CUIDADO DOMICILIARIO  · Siga las indicaciones del médico.  · Evite conducir vehículos.  · Evite operar maquinarias pesadas.  · Evite realizar tareas que serían peligrosas para usted u otras personas durante un episodio de vértigo.  · Comuníquele al médico si nota que ciertos medicamentos  parecen asociarse con las crisis. Algunos medicamentos que se usan para tratar los episodios, en algunas personas los empeoran.  SOLICITE ATENCIÓN MÉDICA DE INMEDIATO SI:  · Los medicamentos no alivian las crisis o hacen que estas empeoren.  · Tiene dificultad para hablar, caminar, siente debilidad o tiene problemas para usar los brazos, las manos o las piernas.  · Comienza a sufrir un dolor de cabeza intenso.  · Las náuseas y los vómitos no se alivian o se agravan.  · Aparecen trastornos visuales.  · Un miembro de su familia nota cambios en su conducta.  · Hay alguna modificación en su trastorno que parece hacerlo empeorar en lugar de mejorar.  ASEGÚRESE DE QUE:   · Comprende estas instrucciones.  · Controlará su enfermedad.  · Solicitará ayuda de inmediato si no mejora o si empeora.     Esta información no tiene como fin reemplazar el consejo del médico. Asegúrese de hacerle al médico cualquier pregunta que tenga.     Document Released: 12/25/2004 Document Revised: 06/09/2011  Elsevier Interactive Patient Education ©2016 [employer] Inc[employer]

## 2015-06-22 NOTE — ED Notes (Signed)
MD at bedside. EDP BELFIE PRESENT TO SEE PT.

## 2015-06-22 NOTE — ED Notes (Signed)
Patient able to ambulate in hall with standby assist.

## 2015-06-22 NOTE — ED Notes (Signed)
Daughter of patient can be reached at [phone], is Spanish interpreter for [employer]. Is concerned because there is family history of brain cancer and requests MRI.

## 2015-06-22 NOTE — ED Notes (Signed)
Patient transported to MRI by [facility]

## 2016-01-28 ENCOUNTER — Encounter (HOSPITAL_COMMUNITY): Payer: Self-pay

## 2016-01-28 ENCOUNTER — Emergency Department (HOSPITAL_COMMUNITY)
Admission: EM | Admit: 2016-01-28 | Discharge: 2016-01-28 | Disposition: A | Discharge status: Home / Self Care | Payer: Medicare Other | Attending: Emergency Medicine | Admitting: Emergency Medicine

## 2016-01-28 ENCOUNTER — Emergency Department (HOSPITAL_COMMUNITY): Payer: Medicare Other

## 2016-01-28 DIAGNOSIS — Z79899 Other long term (current) drug therapy: Secondary | ICD-10-CM | POA: Diagnosis not present

## 2016-01-28 DIAGNOSIS — W010XXA Fall on same level from slipping, tripping and stumbling without subsequent striking against object, initial encounter: Secondary | ICD-10-CM | POA: Diagnosis not present

## 2016-01-28 DIAGNOSIS — Y9389 Activity, other specified: Secondary | ICD-10-CM | POA: Diagnosis not present

## 2016-01-28 DIAGNOSIS — M25511 Pain in right shoulder: Secondary | ICD-10-CM | POA: Insufficient documentation

## 2016-01-28 DIAGNOSIS — Y999 Unspecified external cause status: Secondary | ICD-10-CM | POA: Diagnosis not present

## 2016-01-28 DIAGNOSIS — Z7951 Long term (current) use of inhaled steroids: Secondary | ICD-10-CM | POA: Diagnosis not present

## 2016-01-28 DIAGNOSIS — E039 Hypothyroidism, unspecified: Secondary | ICD-10-CM | POA: Diagnosis not present

## 2016-01-28 DIAGNOSIS — Y9289 Other specified places as the place of occurrence of the external cause: Secondary | ICD-10-CM | POA: Diagnosis not present

## 2016-01-28 MED ORDER — IBUPROFEN 800 MG PO TABS
800.0000 mg | ORAL_TABLET | Freq: Once | ORAL | Status: AC
  Administered 2016-01-28: 800 mg via ORAL
  Filled 2016-01-28: qty 1

## 2016-01-28 MED ORDER — ACETAMINOPHEN 500 MG PO TABS
1000.0000 mg | ORAL_TABLET | Freq: Once | ORAL | Status: AC
  Administered 2016-01-28: 1000 mg via ORAL
  Filled 2016-01-28: qty 2

## 2016-01-28 MED ORDER — OXYCODONE HCL 5 MG PO TABS
5.0000 mg | ORAL_TABLET | Freq: Once | ORAL | Status: AC
  Administered 2016-01-28: 5 mg via ORAL
  Filled 2016-01-28: qty 1

## 2016-01-28 NOTE — Discharge Instructions (Signed)
He did not have any broken bones by x-ray. Wear the sling. He has to take your shoulder out of the sling at least 3 or 4 times a day to do range of motion exercises. Otherwise will get frozen shoulder and this will be much worse than when you have now.  Take 4 over the counter ibuprofen tablets 3 times a day or 2 over-the-counter naproxen tablets twice a day for pain. Also take tylenol 1000mg (2 extra strength) four times a day.

## 2016-01-28 NOTE — ED Triage Notes (Signed)
Patient reports that he slipped in the shower/tub due to slick floor, landing on his right shoulder. Patient c/o pain and numbness to the right 4th and 5th fingers. Patient denies hitting his head.

## 2016-01-28 NOTE — ED Notes (Signed)
Patient transported to X-ray 

## 2016-01-28 NOTE — Progress Notes (Signed)
Attempted to speak with patient, Nurse was in the room at the time.  Genice Rouge [id number] ED CSW 01/28/2016 12:11 PM

## 2016-01-28 NOTE — ED Provider Notes (Signed)
[facility] DEPT Provider Note   CSN: [id number] Arrival date & time: 01/28/16  1034     History   Chief Complaint Chief Complaint  Patient presents with  . Fall  . Shoulder Pain    HPI [name] is a 82 y.o. male.  82 yo M with a chief complaint of right posterior shoulder pain. This happened after the patient slipped in the shower. He landed on that part of his back. Denies head injury denies loss of consciousness denies neck pain. Is having some mild paresthesias to the ulnar aspect of his hand. Denies any difficulty with range of motion of the hand. Denies other injury. Denies chest pain shortness breath abdominal pain or back pain.   The history is provided by the patient.  Fall  This is a new problem. The current episode started 1 to 2 hours ago. The problem occurs constantly. The problem has not changed since onset.Pertinent negatives include no chest pain, no abdominal pain, no headaches and no shortness of breath. Nothing aggravates the symptoms. Nothing relieves the symptoms. He has tried nothing for the symptoms. The treatment provided no relief.  Shoulder Pain      Past Medical History:  Diagnosis Date  . Anxiety   . Benign pigmented nevus   . Cataract    LEFT EYE  . Depression   . Disc degeneration, lumbar   . Epigastric rebound abdominal tenderness   . Esophageal reflux   . Headache(784.0)   . Hearing loss   . Hemorrhoids   . Hiatal hernia   . Hyperlipidemia   . Hypothyroidism   . Insomnia   . OSA (obstructive sleep apnea)   . Osteoarthritis   . Reflux     Patient Active Problem List   Diagnosis Date Noted  . UNSPECIFIED HEARING LOSS 04/30/2010  . HERNIATED LUMBOSACRAL DISC 02/29/2008  . HYPERTROPHY PROSTATE W/O UR OBST & OTH LUTS 01/26/2008  . NEVUS 03/01/2007  . HYPOTHYROIDISM 12/16/2006  . HYPOGONADISM 12/16/2006  . HYPERLIPIDEMIA 12/16/2006  . OBSTRUCTIVE SLEEP APNEA 12/16/2006  . GERD 12/16/2006    Past Surgical History:    Procedure Laterality Date  . BACK SURGERY     L4-L5  . 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 CHOLECYSTECTOMY    . CORNEAL TRANSPLANT     X3 TRANSPLANT AT [facility] CATARACT SURGERY COMPLICATIONS       Home Medications    Prior to Admission medications   Medication Sig Start Date End Date Taking? Authorizing Provider  albuterol (PROVENTIL HFA;VENTOLIN HFA) 108 (90 BASE) MCG/ACT inhaler Inhale 2 puffs into the lungs every 6 (six) hours as needed for wheezing or shortness of breath. 04/10/13   Lacretia Leigh, MD  celecoxib (CELEBREX) 200 MG capsule Take 200 mg by mouth 2 (two) times daily as needed for mild pain.    Historical Provider, MD  fenofibrate 160 MG tablet Take 160 mg by mouth daily.    Historical Provider, MD  levothyroxine (SYNTHROID, LEVOTHROID) 100 MCG tablet Take 100 mcg by mouth daily before breakfast.    Historical Provider, MD  meclizine (ANTIVERT) 25 MG tablet Take 1 tablet (25 mg total) by mouth 3 (three) times daily as needed for dizziness. 06/22/15   Malvin Johns, MD  oxyCODONE-acetaminophen (PERCOCET/ROXICET) 5-325 MG tablet Take 1 tablet by mouth every 4 (four) hours as needed for severe pain.    Historical Provider, MD  prednisoLONE acetate (PRED FORTE)  1 % ophthalmic suspension  05/25/15   Historical Provider, MD  pregabalin (LYRICA) 50 MG capsule Take 50 mg by mouth at bedtime.    Historical Provider, MD  tizanidine (ZANAFLEX) 2 MG capsule Take 2 mg by mouth 2 (two) times daily as needed for muscle spasms.     Historical Provider, MD    Family History Family History  Problem Relation Age of Onset  . Cancer Brother   . Brain cancer Sister     Social History Social History  Substance Use Topics  . Smoking status: Never Smoker  . Smokeless tobacco: Never Used  . Alcohol use No     Allergies   Review of patient's allergies indicates no known allergies.   Review of Systems Review of Systems   Constitutional: Negative for chills and fever.  HENT: Negative for congestion and facial swelling.   Eyes: Negative for discharge and visual disturbance.  Respiratory: Negative for shortness of breath.   Cardiovascular: Negative for chest pain and palpitations.  Gastrointestinal: Negative for abdominal pain, diarrhea and vomiting.  Musculoskeletal: Positive for arthralgias and myalgias.  Skin: Negative for color change and rash.  Neurological: Negative for tremors, syncope and headaches.  Psychiatric/Behavioral: Negative for confusion and dysphoric mood.     Physical Exam Updated Vital Signs BP 178/98 (BP Location: Left Arm)   Pulse 89   Temp 98.2 F (36.8 C) (Oral)   Resp 20   Ht 5\' 5"  (1.651 m)   Wt 160 lb (72.6 kg)   SpO2 94%   BMI 26.63 kg/m   Physical Exam  Constitutional: He is oriented to person, place, and time. He appears well-developed and well-nourished.  HENT:  Head: Normocephalic and atraumatic.  Eyes: EOM are normal. Pupils are equal, round, and reactive to light.  Neck: Normal range of motion. Neck supple. No JVD present.  Cardiovascular: Normal rate and regular rhythm.  Exam reveals no gallop and no friction rub.   No murmur heard. Pulmonary/Chest: No respiratory distress. He has no wheezes.  Abdominal: He exhibits no distension and no mass. There is no tenderness. There is no rebound and no guarding.  Musculoskeletal: Normal range of motion. He exhibits tenderness.  Posterior pain about the scapula. Mild pain to range of motion. Pulse motor and sensation is intact distally.  Neurological: He is alert and oriented to person, place, and time.  Skin: No rash noted. No pallor.  Psychiatric: He has a normal mood and affect. His behavior is normal.  Nursing note and vitals reviewed.    ED Treatments / Results  Labs (all labs ordered are listed, but only abnormal results are displayed) Labs Reviewed - No data to display  EKG  EKG Interpretation None        Radiology Dg Scapula Right  Result Date: 01/28/2016 CLINICAL DATA:  Acute right shoulder and scapula pain secondary to a fall in the shower this morning. EXAM: RIGHT SCAPULA - 2+ VIEWS COMPARISON:  None. FINDINGS: There is no evidence of fracture or dislocation. Soft tissues are unremarkable. IMPRESSION: Negative. Electronically Signed   By: Lorriane Shire M.D.   On: 01/28/2016 11:27   Dg Shoulder Right  Result Date: 01/28/2016 CLINICAL DATA:  Right shoulder and scapula pain with limited range of motion since a fall in the shower this morning. EXAM: RIGHT SHOULDER - 2+ VIEW COMPARISON:  None. FINDINGS: There is no evidence of fracture or dislocation. Mild degenerative changes of the glenohumeral joint. IMPRESSION: No acute abnormalities. Electronically Signed   By:  Lorriane Shire M.D.   On: 01/28/2016 11:25    Procedures Procedures (including critical care time)  Medications Ordered in ED Medications  acetaminophen (TYLENOL) tablet 1,000 mg (1,000 mg Oral Given 01/28/16 1130)  oxyCODONE (Oxy IR/ROXICODONE) immediate release tablet 5 mg (5 mg Oral Given 01/28/16 1130)  ibuprofen (ADVIL,MOTRIN) tablet 800 mg (800 mg Oral Given 01/28/16 1130)     Initial Impression / Assessment and Plan / ED Course  I have reviewed the triage vital signs and the nursing notes.  Pertinent labs & imaging results that were available during my care of the patient were reviewed by me and considered in my medical decision making (see chart for details).  Clinical Course    82 yo M With a chief complaint of a fall. Patient had x-rays done of the right shoulder and scapula that were negative. We'll place in a sling for comfort. Given orthopedic follow-up if needed.  11:38 AM:  I have discussed the diagnosis/risks/treatment options with the patient and family and believe the pt to be eligible for discharge home to follow-up with PCP, Ortho. We also discussed returning to the ED immediately if new or  worsening sx occur. We discussed the sx which are most concerning (e.g., sudden worsening pain, fever, inability to tolerate by mouth) that necessitate immediate return. Medications administered to the patient during their visit and any new prescriptions provided to the patient are listed below.  Medications given during this visit Medications  acetaminophen (TYLENOL) tablet 1,000 mg (1,000 mg Oral Given 01/28/16 1130)  oxyCODONE (Oxy IR/ROXICODONE) immediate release tablet 5 mg (5 mg Oral Given 01/28/16 1130)  ibuprofen (ADVIL,MOTRIN) tablet 800 mg (800 mg Oral Given 01/28/16 1130)     The patient appears reasonably screen and/or stabilized for discharge and I doubt any other medical condition or other [facility] requiring further screening, evaluation, or treatment in the ED at this time prior to discharge.    Final Clinical Impressions(s) / ED Diagnoses   Final diagnoses:  Acute pain of right shoulder    New Prescriptions New Prescriptions   No medications on file     Deno Etienne, DO 01/28/16 1138

## 2016-03-30 ENCOUNTER — Emergency Department (HOSPITAL_COMMUNITY): Payer: Medicare Other

## 2016-03-30 ENCOUNTER — Encounter (HOSPITAL_COMMUNITY): Payer: Self-pay | Admitting: Emergency Medicine

## 2016-03-30 ENCOUNTER — Emergency Department (HOSPITAL_COMMUNITY)
Admission: EM | Admit: 2016-03-30 | Discharge: 2016-03-30 | Disposition: A | Discharge status: Home / Self Care | Payer: Medicare Other | Attending: Emergency Medicine | Admitting: Emergency Medicine

## 2016-03-30 DIAGNOSIS — Z79899 Other long term (current) drug therapy: Secondary | ICD-10-CM | POA: Diagnosis not present

## 2016-03-30 DIAGNOSIS — R05 Cough: Secondary | ICD-10-CM | POA: Diagnosis not present

## 2016-03-30 DIAGNOSIS — E039 Hypothyroidism, unspecified: Secondary | ICD-10-CM | POA: Insufficient documentation

## 2016-03-30 DIAGNOSIS — R51 Headache: Secondary | ICD-10-CM | POA: Insufficient documentation

## 2016-03-30 DIAGNOSIS — R6889 Other general symptoms and signs: Secondary | ICD-10-CM

## 2016-03-30 LAB — CBC WITH DIFFERENTIAL/PLATELET
Basophils Absolute: 0 10*3/uL (ref 0.0–0.1)
Basophils Relative: 0 %
Eosinophils Absolute: 0.1 10*3/uL (ref 0.0–0.7)
Eosinophils Relative: 1 %
HCT: 47.9 % (ref 39.0–52.0)
Hemoglobin: 16.8 g/dL (ref 13.0–17.0)
Lymphocytes Relative: 5 %
Lymphs Abs: 0.5 10*3/uL — ABNORMAL LOW (ref 0.7–4.0)
MCH: 31.2 pg (ref 26.0–34.0)
MCHC: 35.1 g/dL (ref 30.0–36.0)
MCV: 89 fL (ref 78.0–100.0)
Monocytes Absolute: 0.9 10*3/uL (ref 0.1–1.0)
Monocytes Relative: 10 %
Neutro Abs: 7.9 10*3/uL — ABNORMAL HIGH (ref 1.7–7.7)
Neutrophils Relative %: 84 %
Platelets: 145 10*3/uL — ABNORMAL LOW (ref 150–400)
RBC: 5.38 MIL/uL (ref 4.22–5.81)
RDW: 13.6 % (ref 11.5–15.5)
WBC: 9.4 10*3/uL (ref 4.0–10.5)

## 2016-03-30 LAB — URINALYSIS, ROUTINE W REFLEX MICROSCOPIC
Bacteria, UA: NONE SEEN
Bilirubin Urine: NEGATIVE
Glucose, UA: NEGATIVE mg/dL
Ketones, ur: NEGATIVE mg/dL
Leukocytes, UA: NEGATIVE
Nitrite: NEGATIVE
Protein, ur: 100 mg/dL — AB
Specific Gravity, Urine: 1.019 (ref 1.005–1.030)
Squamous Epithelial / LPF: NONE SEEN
pH: 6 (ref 5.0–8.0)

## 2016-03-30 LAB — BASIC METABOLIC PANEL
Anion gap: 10 (ref 5–15)
BUN: 16 mg/dL (ref 6–20)
CO2: 25 mmol/L (ref 22–32)
Calcium: 9.5 mg/dL (ref 8.9–10.3)
Chloride: 105 mmol/L (ref 101–111)
Creatinine, Ser: 1.14 mg/dL (ref 0.61–1.24)
GFR calc Af Amer: >60 mL/min (ref >60)
GFR calc non Af Amer: 58 mL/min — ABNORMAL LOW (ref >60)
Glucose, Bld: 102 mg/dL — ABNORMAL HIGH (ref 65–99)
Potassium: 3.8 mmol/L (ref 3.5–5.1)
Sodium: 140 mmol/L (ref 135–145)

## 2016-03-30 LAB — I-STAT CG4 LACTIC ACID, ED: Lactic Acid, Venous: 2.99 mmol/L (ref 0.5–1.9)

## 2016-03-30 MED ORDER — SODIUM CHLORIDE 0.9 % IV BOLUS (SEPSIS)
1000.0000 mL | Freq: Once | INTRAVENOUS | Status: AC
  Administered 2016-03-30: 1000 mL via INTRAVENOUS

## 2016-03-30 MED ORDER — KETOROLAC TROMETHAMINE 15 MG/ML IJ SOLN
15.0000 mg | Freq: Once | INTRAMUSCULAR | Status: AC
  Administered 2016-03-30: 15 mg via INTRAVENOUS
  Filled 2016-03-30: qty 1

## 2016-03-30 MED ORDER — OSELTAMIVIR PHOSPHATE 75 MG PO CAPS: 75.0000 mg | ORAL_CAPSULE | Freq: Two times a day (BID) | ORAL | 0 refills | Status: DC

## 2016-03-30 NOTE — ED Triage Notes (Signed)
Patient c/o sore throat, unproductive cough, subjective fever, headache with eye pain, chest pain with cough only, no chest pain when not coughing. Onset last night.

## 2016-04-07 NOTE — ED Provider Notes (Signed)
[facility] DEPT Provider Note   CSN: [id number] Arrival date & time: 03/30/16  [id number]     History   Chief Complaint Chief Complaint  Patient presents with  . Cough    HPI [name] is a 83 y.o. male.  HPI   82 year old male with sore throat, cough headaches myalgias nonproductive cough. Symptom onset last night. Persistent since then. No dyspnea. Has felt chilled at times. No sick contacts. No urinary complaints. No nausea or vomiting but has had a poor appetite.  Past Medical History:  Diagnosis Date  . Anxiety   . Benign pigmented nevus   . Cataract    LEFT EYE  . Depression   . Disc degeneration, lumbar   . Epigastric rebound abdominal tenderness   . Esophageal reflux   . Headache(784.0)   . Hearing loss   . Hemorrhoids   . Hiatal hernia   . Hyperlipidemia   . Hypothyroidism   . Insomnia   . OSA (obstructive sleep apnea)   . Osteoarthritis   . Reflux     Patient Active Problem List   Diagnosis Date Noted  . UNSPECIFIED HEARING LOSS 04/30/2010  . HERNIATED LUMBOSACRAL DISC 02/29/2008  . HYPERTROPHY PROSTATE W/O UR OBST & OTH LUTS 01/26/2008  . NEVUS 03/01/2007  . HYPOTHYROIDISM 12/16/2006  . HYPOGONADISM 12/16/2006  . HYPERLIPIDEMIA 12/16/2006  . OBSTRUCTIVE SLEEP APNEA 12/16/2006  . GERD 12/16/2006    Past Surgical History:  Procedure Laterality Date  . BACK SURGERY     L4-L5  . 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 CHOLECYSTECTOMY    . CORNEAL TRANSPLANT     X3 TRANSPLANT AT [facility] CATARACT SURGERY COMPLICATIONS       Home Medications    Prior to Admission medications   Medication Sig Start Date End Date Taking? Authorizing Provider  albuterol (PROVENTIL HFA;VENTOLIN HFA) 108 (90 BASE) MCG/ACT inhaler Inhale 2 puffs into the lungs every 6 (six) hours as needed for wheezing or shortness of breath. 04/10/13  Yes Lacretia Leigh, MD  celecoxib (CELEBREX) 200 MG capsule Take 200 mg  by mouth 2 (two) times daily as needed for mild pain.   Yes Historical Provider, MD  levothyroxine (SYNTHROID, LEVOTHROID) 100 MCG tablet Take 100 mcg by mouth daily before breakfast.   Yes Historical Provider, MD  oxyCODONE-acetaminophen (PERCOCET/ROXICET) 5-325 MG tablet Take 1 tablet by mouth every 4 (four) hours as needed for severe pain.   Yes Historical Provider, MD  prednisoLONE acetate (PRED FORTE) 1 % ophthalmic suspension  05/25/15  Yes Historical Provider, MD  fenofibrate 160 MG tablet Take 160 mg by mouth daily.    Historical Provider, MD  meclizine (ANTIVERT) 25 MG tablet Take 1 tablet (25 mg total) by mouth 3 (three) times daily as needed for dizziness. Patient not taking: Reported on 03/30/2016 06/22/15   Malvin Johns, MD  oseltamivir (TAMIFLU) 75 MG capsule Take 1 capsule (75 mg total) by mouth every 12 (twelve) hours. 03/30/16   Virgel Manifold, MD  pregabalin (LYRICA) 50 MG capsule Take 50 mg by mouth at bedtime.    Historical Provider, MD  tizanidine (ZANAFLEX) 2 MG capsule Take 2 mg by mouth 2 (two) times daily as needed for muscle spasms.     Historical Provider, MD    Family History Family History  Problem Relation Age of Onset  . Cancer Brother   . Brain cancer Sister     Social  History Social History  Substance Use Topics  . Smoking status: Never Smoker  . Smokeless tobacco: Never Used  . Alcohol use No     Allergies   Patient has no known allergies.   Review of Systems Review of Systems   Physical Exam Updated Vital Signs BP 134/88   Pulse 106   Temp 98.2 F (36.8 C) (Oral)   Resp 23   SpO2 94%   Physical Exam  Constitutional: He appears well-developed and well-nourished. No distress.  HENT:  Head: Normocephalic and atraumatic.  Eyes: Conjunctivae are normal. Right eye exhibits no discharge. Left eye exhibits no discharge.  Neck: Neck supple.  Cardiovascular: Normal rate, regular rhythm and normal heart sounds.  Exam reveals no gallop and no  friction rub.   No murmur heard. Pulmonary/Chest: Effort normal and breath sounds normal. No respiratory distress.  Abdominal: Soft. He exhibits no distension. There is no tenderness.  Musculoskeletal: He exhibits no edema or tenderness.  Neurological: He is alert.  Skin: Skin is warm and dry.  Psychiatric: He has a normal mood and affect. His behavior is normal. Thought content normal.  Nursing note and vitals reviewed.    ED Treatments / Results  Labs (all labs ordered are listed, but only abnormal results are displayed) Labs Reviewed  CBC WITH DIFFERENTIAL/PLATELET - Abnormal; Notable for the following:       Result Value   Platelets 145 (*)    Neutro Abs 7.9 (*)    Lymphs Abs 0.5 (*)    All other components within normal limits  BASIC METABOLIC PANEL - Abnormal; Notable for the following:    Glucose, Bld 102 (*)    GFR calc non Af Amer 58 (*)    All other components within normal limits  URINALYSIS, ROUTINE W REFLEX MICROSCOPIC - Abnormal; Notable for the following:    Hgb urine dipstick SMALL (*)    Protein, ur 100 (*)    All other components within normal limits  I-STAT CG4 LACTIC ACID, ED - Abnormal; Notable for the following:    Lactic Acid, Venous 2.99 (*)    All other components within normal limits    EKG  EKG Interpretation  Date/Time:  Sunday March 30 2016 09:42:51 EST Ventricular Rate:  118 PR Interval:    QRS Duration: 85 QT Interval:  335 QTC Calculation: 470 R Axis:   9 Text Interpretation:  Sinus tachycardia Baseline wander in lead(s) V6 SINCE LAST TRACING HEART RATE HAS INCREASED Confirmed by JACUBOWITZ  MD, SAM ([id number]) on 04/02/2016 10:13:08 AM       Radiology No results found.  Procedures Procedures (including critical care time)  Medications Ordered in ED Medications  sodium chloride 0.9 % bolus 1,000 mL (0 mLs Intravenous Stopped 03/30/16 1124)  ketorolac (TORADOL) 15 MG/ML injection 15 mg (15 mg Intravenous Given 03/30/16 1024)      Initial Impression / Assessment and Plan / ED Course  I have reviewed the triage vital signs and the nursing notes.  Pertinent labs & imaging results that were available during my care of the patient were reviewed by me and considered in my medical decision making (see chart for details).  Clinical Course     83  year old male with flulike symptoms. Mild tachycardia, but he does not appear toxic. He is not hypotensive. He is not vomiting. Despite his age, I feel he is appropriate for discharge at this time. We'll start him on Tamiflu empirically. Symptomatic treatment otherwise. Plenty of rest. Fluids. Return precautions  discussed.  Final Clinical Impressions(s) / ED Diagnoses   Final diagnoses:  Flu-like symptoms    New Prescriptions Discharge Medication List as of 03/30/2016 12:26 PM    START taking these medications   Details  oseltamivir (TAMIFLU) 75 MG capsule Take 1 capsule (75 mg total) by mouth every 12 (twelve) hours., Starting Sun 03/30/2016, Print         Virgel Manifold, MD 04/07/16 [phone]

## 2016-06-12 ENCOUNTER — Ambulatory Visit (HOSPITAL_COMMUNITY)
Admission: EM | Admit: 2016-06-12 | Discharge: 2016-06-12 | Disposition: A | Discharge status: Home / Self Care | Payer: Medicare Other | Attending: Family Medicine | Admitting: Family Medicine

## 2016-06-12 ENCOUNTER — Encounter (HOSPITAL_COMMUNITY): Payer: Self-pay | Admitting: Emergency Medicine

## 2016-06-12 DIAGNOSIS — R51 Headache: Secondary | ICD-10-CM

## 2016-06-12 DIAGNOSIS — I160 Hypertensive urgency: Secondary | ICD-10-CM | POA: Diagnosis not present

## 2016-06-12 DIAGNOSIS — G4489 Other headache syndrome: Secondary | ICD-10-CM

## 2016-06-12 DIAGNOSIS — F419 Anxiety disorder, unspecified: Secondary | ICD-10-CM

## 2016-06-12 MED ORDER — CLONIDINE HCL 0.1 MG PO TABS
0.2000 mg | ORAL_TABLET | Freq: Once | ORAL | Status: AC
  Administered 2016-06-12: 0.2 mg via ORAL

## 2016-06-12 MED ORDER — ALPRAZOLAM 0.25 MG PO TABS: 0.2500 mg | ORAL_TABLET | Freq: Two times a day (BID) | ORAL | 0 refills | Status: DC | PRN

## 2016-06-12 MED ORDER — CLONIDINE HCL 0.1 MG PO TABS
ORAL_TABLET | ORAL | Status: AC
  Filled 2016-06-12: qty 2

## 2016-06-12 MED ORDER — ALPRAZOLAM 0.25 MG PO TABS: 0.2500 mg | ORAL_TABLET | Freq: Once | ORAL | Status: DC

## 2016-06-12 MED ORDER — KETOROLAC TROMETHAMINE 30 MG/ML IJ SOLN
INTRAMUSCULAR | Status: AC
  Filled 2016-06-12: qty 1

## 2016-06-12 MED ORDER — KETOROLAC TROMETHAMINE 60 MG/2ML IM SOLN
30.0000 mg | Freq: Once | INTRAMUSCULAR | Status: AC
  Administered 2016-06-12: 30 mg via INTRAMUSCULAR

## 2016-06-12 NOTE — ED Provider Notes (Signed)
[facility]    CSN: [id number] Arrival date & time: 06/12/16  1014     History   Chief Complaint Chief Complaint  Patient presents with  . Headache    HPI [name] is a 83 y.o. male.   HPI Headache. Started 2 days ago. Constant. Described as being in the front and back of the head. Significant relief with 800 mg of ibuprofen 1 with return of symptoms approximately 7 hours later. States the headache is worse with certain head movements. Denies focal neurological deficits. Denies photophobia or phonophobia. States that he's never had a headache like this before. Patient's only medication change was initiation of blood pressure medications at time around the beginning of the month. Patient took this medicine until 2 days ago when he stopped for no apparent reason. Denies any chest pain, palpitations, shortness of breath, nausea, vomiting, neck stiffness, fevers, abdominal pain.  Encounter assisted by Spanish line interpreter    Past Medical History:  Diagnosis Date  . Anxiety   . Benign pigmented nevus   . Cataract    LEFT EYE  . Depression   . Disc degeneration, lumbar   . Epigastric rebound abdominal tenderness   . Esophageal reflux   . Headache(784.0)   . Hearing loss   . Hemorrhoids   . Hiatal hernia   . Hyperlipidemia   . Hypothyroidism   . Insomnia   . OSA (obstructive sleep apnea)   . Osteoarthritis   . Reflux     Patient Active Problem List   Diagnosis Date Noted  . UNSPECIFIED HEARING LOSS 04/30/2010  . HERNIATED LUMBOSACRAL DISC 02/29/2008  . HYPERTROPHY PROSTATE W/O UR OBST & OTH LUTS 01/26/2008  . NEVUS 03/01/2007  . HYPOTHYROIDISM 12/16/2006  . HYPOGONADISM 12/16/2006  . HYPERLIPIDEMIA 12/16/2006  . OBSTRUCTIVE SLEEP APNEA 12/16/2006  . GERD 12/16/2006    Past Surgical History:  Procedure Laterality Date  . BACK SURGERY     L4-L5  . 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  CHOLECYSTECTOMY    . CORNEAL TRANSPLANT     X3 TRANSPLANT AT [facility] CATARACT SURGERY COMPLICATIONS       Home Medications    Prior to Admission medications   Medication Sig Start Date End Date Taking? Authorizing Provider  celecoxib (CELEBREX) 200 MG capsule Take 200 mg by mouth 2 (two) times daily as needed for mild pain.   Yes Historical Provider, MD  fenofibrate 160 MG tablet Take 160 mg by mouth daily.   Yes Historical Provider, MD  albuterol (PROVENTIL HFA;VENTOLIN HFA) 108 (90 BASE) MCG/ACT inhaler Inhale 2 puffs into the lungs every 6 (six) hours as needed for wheezing or shortness of breath. 04/10/13   Lacretia Leigh, MD  levothyroxine (SYNTHROID, LEVOTHROID) 100 MCG tablet Take 100 mcg by mouth daily before breakfast.    Historical Provider, MD  meclizine (ANTIVERT) 25 MG tablet Take 1 tablet (25 mg total) by mouth 3 (three) times daily as needed for dizziness. Patient not taking: Reported on 03/30/2016 06/22/15   Malvin Johns, MD  oseltamivir (TAMIFLU) 75 MG capsule Take 1 capsule (75 mg total) by mouth every 12 (twelve) hours. 03/30/16   Virgel Manifold, MD  oxyCODONE-acetaminophen (PERCOCET/ROXICET) 5-325 MG tablet Take 1 tablet by mouth every 4 (four) hours as needed for severe pain.    Historical Provider, MD  prednisoLONE acetate (PRED FORTE) 1 % ophthalmic suspension  05/25/15   Historical  Provider, MD  pregabalin (LYRICA) 50 MG capsule Take 50 mg by mouth at bedtime.    Historical Provider, MD  tizanidine (ZANAFLEX) 2 MG capsule Take 2 mg by mouth 2 (two) times daily as needed for muscle spasms.     Historical Provider, MD    Family History Family History  Problem Relation Age of Onset  . Cancer Brother   . Brain cancer Sister     Social History Social History  Substance Use Topics  . Smoking status: Never Smoker  . Smokeless tobacco: Never Used  . Alcohol use No     Allergies   Patient has no known allergies.   Review of Systems Review of Systems  Per  HPI with all other pertinent systems negative.   Physical Exam Triage Vital Signs ED Triage Vitals [06/12/16 1101]  Enc Vitals Group     BP (!) 164/107     Pulse Rate 96     Resp      Temp 98 F (36.7 C)     Temp Source Oral     SpO2 98 %     Weight      Height      Head Circumference      Peak Flow      Pain Score 9     Pain Loc      Pain Edu?      Excl. in [place]?    No data found.   Updated Vital Signs BP (!) 164/107 (BP Location: Left Arm)   Pulse 96   Temp 98 F (36.7 C) (Oral)   SpO2 98%   Visual Acuity Right Eye Distance:   Left Eye Distance:   Bilateral Distance:    Right Eye Near:   Left Eye Near:    Bilateral Near:     Physical Exam Physical Exam  Constitutional: oriented to person, place, and time. appears well-developed and well-nourished. No distress.  HENT:  Head: Normocephalic and atraumatic.  Eyes: EOMI. PERRL.  Neck: Normal range of motion.  Cardiovascular: RRR, no m/r/g, 2+ distal pulses,  Pulmonary/Chest: Effort normal and breath sounds normal. No respiratory distress.  Abdominal: Soft. Bowel sounds are normal. NonTTP, no distension.  Musculoskeletal: Normal range of motion. Non ttp, no effusion.  Neurological: alert and oriented to person, place, and time.  no focal deficit. Moves all extremity coordinated fashion. Skin: Skin is warm. No rash noted. non diaphoretic.  Psychiatric: normal mood and affect. behavior is normal. Judgment and thought content normal.    UC Treatments / Results  Labs (all labs ordered are listed, but only abnormal results are displayed) Labs Reviewed - No data to display  EKG  EKG Interpretation None       Radiology No results found.  Procedures Procedures (including critical care time)  Medications Ordered in UC Medications  cloNIDine (CATAPRES) tablet 0.2 mg (not administered)  ketorolac (TORADOL) injection 30 mg (not administered)  ALPRAZolam (XANAX) tablet 0.25 mg (not administered)      Initial Impression / Assessment and Plan / UC Course  I have reviewed the triage vital signs and the nursing notes.  Pertinent labs & imaging results that were available during my care of the patient were reviewed by me and considered in my medical decision making (see chart for details).     Toradol, clonidine, given w/ resolution of sx. - pt to go home and resume valsartan Blood pressure 30 minutes after menstruation of medication was 140/90 Patient instructed that if home blood  pressure readings go above 210/110 and if his headache returns he is to go to the emergency room immediately. Patient also endorsing a fair amount of anxiety and stress where he states he worries about things he knows he should not. This may also be contributing to patient's blood pressure and headache. We'll prescribe 10, 0.25 mg tablets of Xanax and have counseled patient to follow-up with his primary care physician.  Final Clinical Impressions(s) / UC Diagnoses   Final diagnoses:  None    New Prescriptions New Prescriptions   No medications on file     Waldemar Dickens, MD 06/12/16 1230

## 2016-06-12 NOTE — ED Triage Notes (Signed)
Pt has had a headache for several days.  The last two days he states it has been the worst to the point his legs shake and he feels like he is going to fall. Pt was newly prescribed HBP medications 8 days ago, but he does not know what it is.  Pt hasn't taken his HBP medication in two days.

## 2016-06-12 NOTE — Discharge Instructions (Signed)
Your headache likely came from your blood pressure becoming too high. High blood pressure can develop as you age. The blood pressure medicine we gave you helped bring down your blood pressure which helped with your headache. We also gave you a shot of medicine similar to ibuprofen to help with your headache. Please ago to the emergency room if your blood pressure goes higher than 210/110 and you develop a headache. Please take your blood pressure medicine today and every day as prescribed. Please consider using some of the anxiety medicine to help with your stress. Follow up with your primary care provider.     Su dolor de cabeza probablemente provenga de que su presin arterial se vuelva demasiado alta. La presin arterial alta puede desarrollarse a medida que envejece. El medicamento para la presin arterial que le dimos le ayud a reducir su presin arterial, lo que le ayud con su dolor de [place]. Tambin le dimos una inyeccin de medicamento similar al ibuprofeno para ayudarlo con su dolor de [place]. Por favor, acuda a la sala de emergencias si su presin arterial es ms alta que 210/110 y desarrolla dolor de [place]. Por favor, tome su medicamento para la presin arterial hoy y [employer] segn lo recetado. Considere usar [employer] para la ansiedad para ayudarlo con su estrs. Haga un seguimiento con su proveedor de Midwife.

## 2016-08-29 ENCOUNTER — Other Ambulatory Visit: Payer: Self-pay | Admitting: Orthopedic Surgery

## 2016-08-29 DIAGNOSIS — M48062 Spinal stenosis, lumbar region with neurogenic claudication: Secondary | ICD-10-CM

## 2016-09-01 ENCOUNTER — Ambulatory Visit
Admission: RE | Admit: 2016-09-01 | Discharge: 2016-09-01 | Disposition: A | Discharge status: Home / Self Care | Payer: 59 | Source: Ambulatory Visit | Attending: Orthopedic Surgery | Admitting: Orthopedic Surgery

## 2016-09-01 DIAGNOSIS — M48062 Spinal stenosis, lumbar region with neurogenic claudication: Secondary | ICD-10-CM

## 2016-11-24 IMAGING — MR MR HEAD WO/W CM
9 of 12 series · 36 of 48 positions shown · IV contrast (Yes)
Comparison: CT head 03/22/2009

CLINICAL DATA: Vertigo.  Headaches and nausea

EXAM:
MRI HEAD WITHOUT AND WITH CONTRAST
TECHNIQUE: Multiplanar, multiecho pulse sequences of the brain and surrounding
structures were obtained without and with intravenous contrast.
CONTRAST:  15mL MULTIHANCE GADOBENATE DIMEGLUMINE 529 MG/ML IV SOLN

[Series 3: T1 · sagittal · 5.0mm · 0.47mm/px · 3 of 24 slices shown]
[im 1/24]
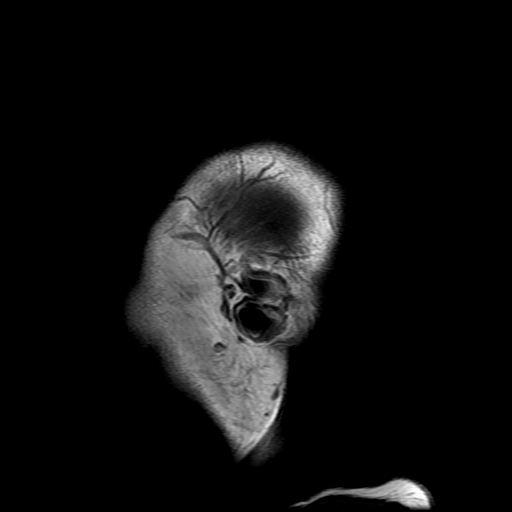
[im 12/24]
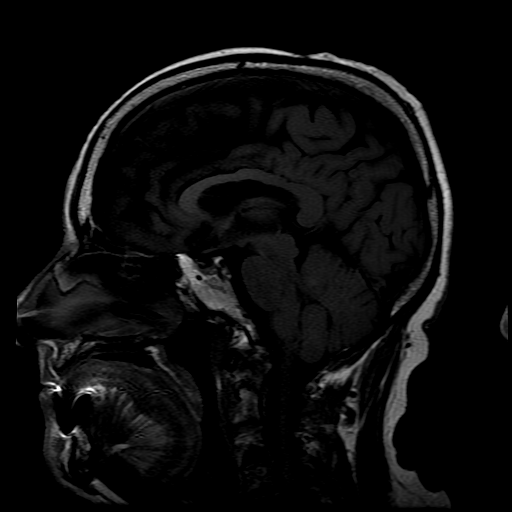
[im 24/24]
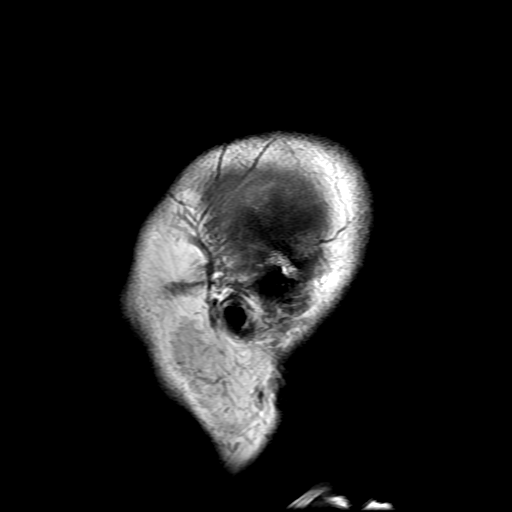

[Series 4: DWI · axial · 3.0mm · 1.09mm/px · z∈[-48,+108]mm · 11 of 106 slices shown (1 of 4)]
[im 1/106]
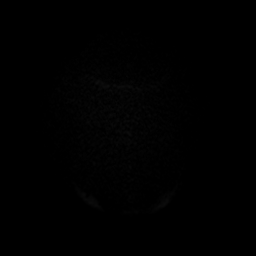
[im 11/106]
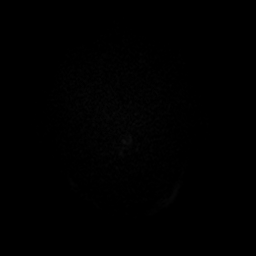
[im 22/106]
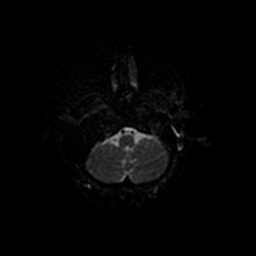
[im 32/106]
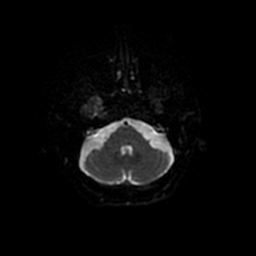
[im 43/106]
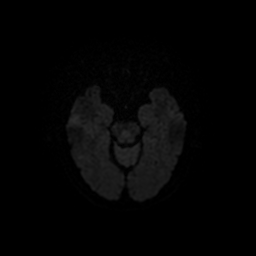
[im 53/106]
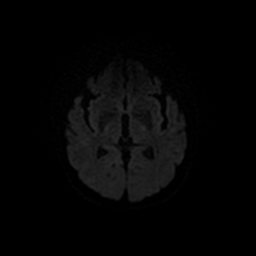
[im 64/106]
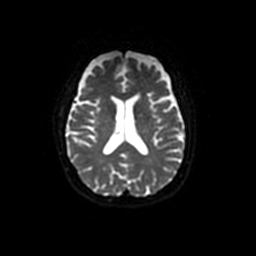
[im 74/106]
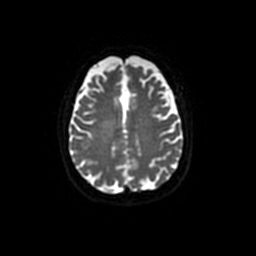
[im 85/106]
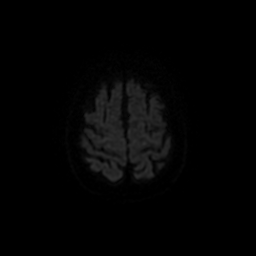
[im 95/106]
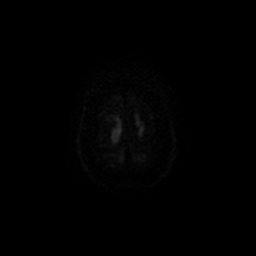
[im 106/106]
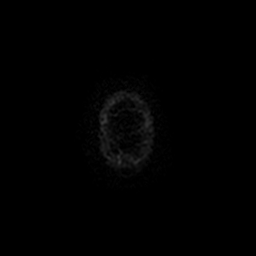

[Series 5: DWI · coronal · 5.0mm · 1.09mm/px · 6 of 68 slices shown (2 of 4)]
[im 1/68]
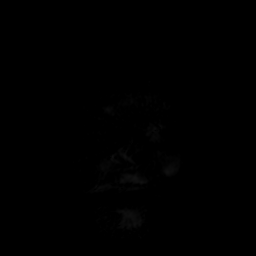
[im 14/68]
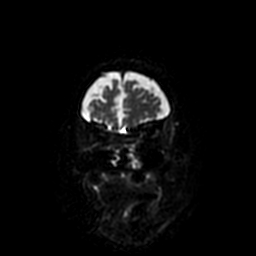
[im 27/68]
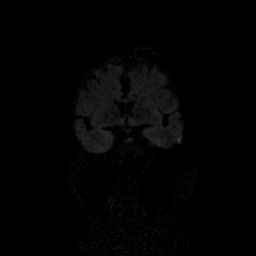
[im 41/68]
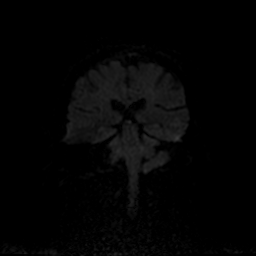
[im 54/68]
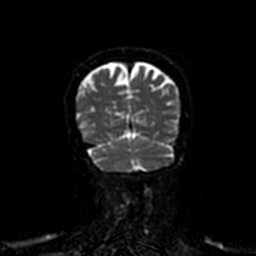
[im 68/68]
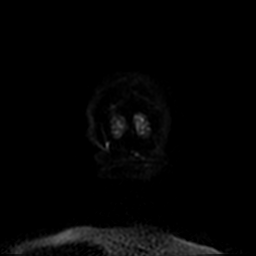

[Series 6: T2 · axial · 5.0mm · 0.43mm/px · z∈[-43,+113]mm · 2 of 25 slices shown]
[im 1/25]
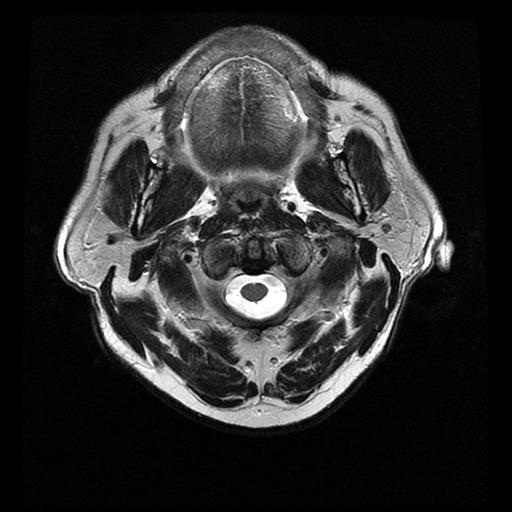
[im 25/25]
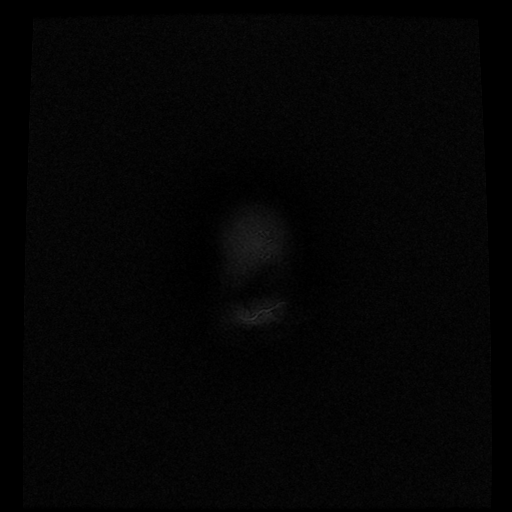

[Series 7: FLAIR · axial · 5.0mm · 0.43mm/px · z∈[-49,+119]mm · 2 of 25 slices shown]
[im 1/25]
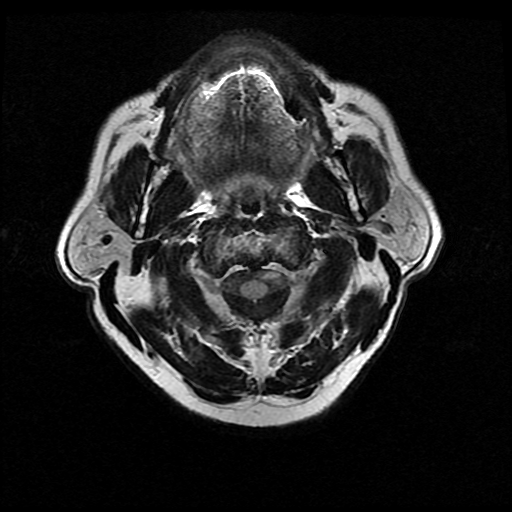
[im 25/25]
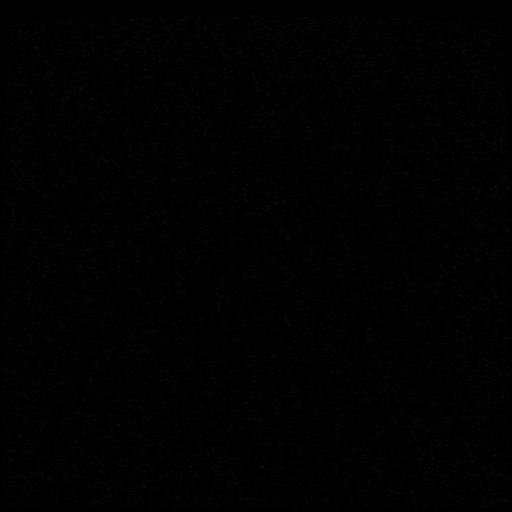

[Series 10: T2 post-contrast · coronal · 5.0mm · 0.45mm/px · 2 of 26 slices shown]
[im 1/26]
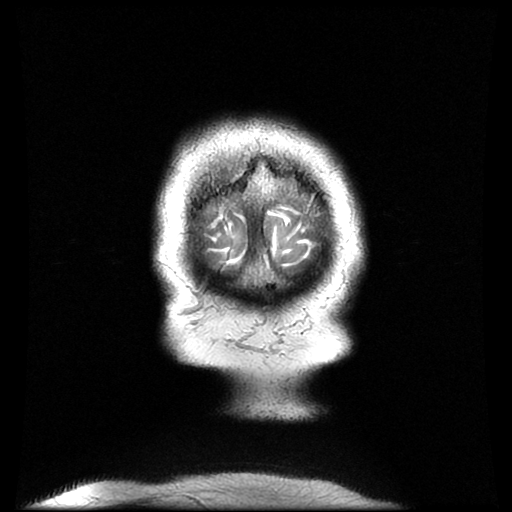
[im 26/26]
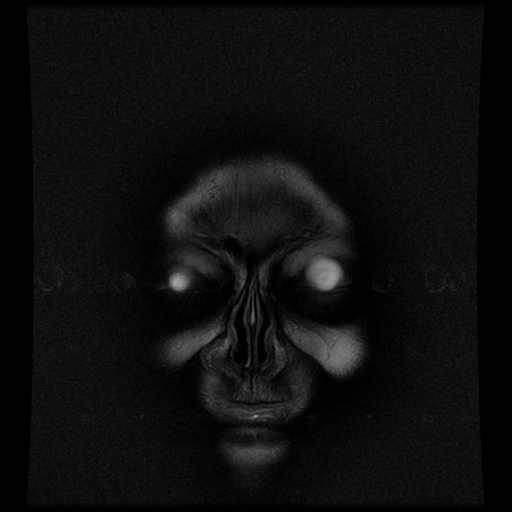

[Series 12: T1 post-contrast · coronal · 5.0mm · 0.45mm/px · 2 of 26 slices shown]
[im 1/26]
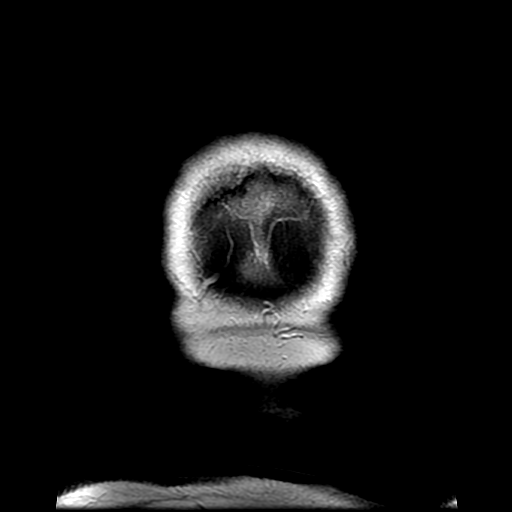
[im 26/26]
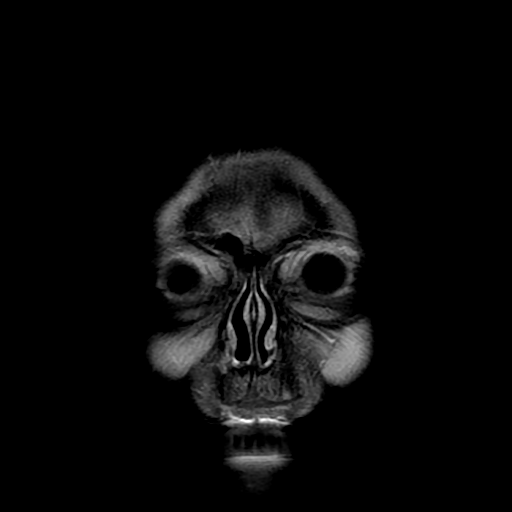

[Series 400: DWI · axial · 3.0mm · 1.09mm/px · z∈[-48,+108]mm · 5 of 53 slices shown (3 of 4)]
[im 1/53]
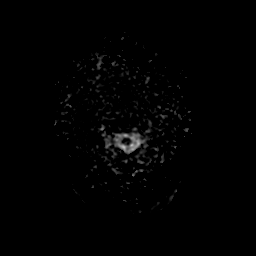
[im 14/53]
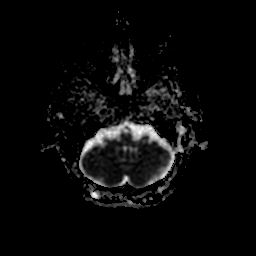
[im 27/53]
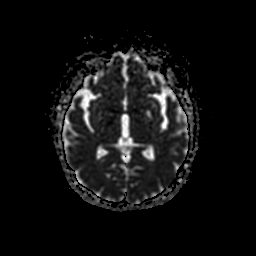
[im 40/53]
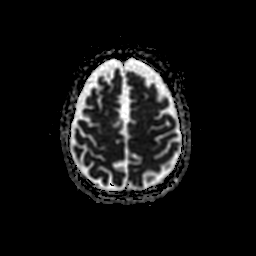
[im 53/53]
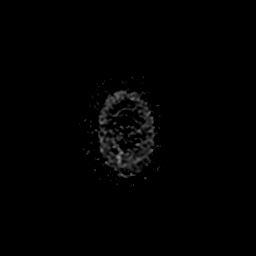

[Series 500: DWI · coronal · 5.0mm · 1.09mm/px · 3 of 34 slices shown (4 of 4)]
[im 1/34]
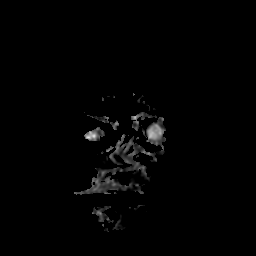
[im 17/34]
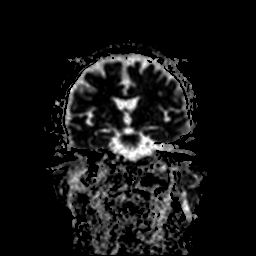
[im 34/34]
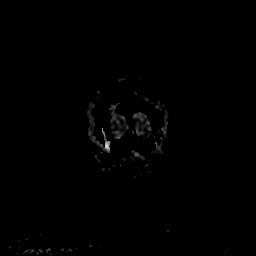

[36 of 48 positions shown; findings below may reference images not displayed]

FINDINGS: Generalized atrophy.  Negative for hydrocephalus.

Negative for acute infarct. Mild chronic white matter changes
consistent with microvascular ischemia. Brainstem and cerebellum
intact.

Negative for intracranial hemorrhage. No intracranial mass or edema.
No shift of the midline structures

Postcontrast imaging reveals normal enhancement. No enhancing mass
lesion. Normal vascular enhancement.

6 x 15 mm cyst in the right inferior medial orbit adjacent to the
globe. This does not enhance. There has been prior ocular surgery
and lens replacement bilaterally. The cyst could be related to prior
surgery.

Pituitary normal in size. Normal skullbase. Paranasal sinuses clear.
IMPRESSION: Atrophy and mild chronic microvascular ischemia. No acute
intracranial abnormality.

6 x 15 mm cyst right orbit with a benign appearance. Prior lens
replacement bilaterally.

## 2017-01-22 ENCOUNTER — Encounter (HOSPITAL_COMMUNITY): Payer: Self-pay | Admitting: Emergency Medicine

## 2017-01-22 ENCOUNTER — Emergency Department (HOSPITAL_COMMUNITY)
Admission: EM | Admit: 2017-01-22 | Discharge: 2017-01-22 | Disposition: A | Discharge status: Home / Self Care | Payer: 59 | Attending: Emergency Medicine | Admitting: Emergency Medicine

## 2017-01-22 ENCOUNTER — Emergency Department (HOSPITAL_COMMUNITY): Payer: 59

## 2017-01-22 DIAGNOSIS — Y939 Activity, unspecified: Secondary | ICD-10-CM | POA: Diagnosis not present

## 2017-01-22 DIAGNOSIS — Z79899 Other long term (current) drug therapy: Secondary | ICD-10-CM | POA: Diagnosis not present

## 2017-01-22 DIAGNOSIS — M20011 Mallet finger of right finger(s): Secondary | ICD-10-CM

## 2017-01-22 DIAGNOSIS — W231XXA Caught, crushed, jammed, or pinched between stationary objects, initial encounter: Secondary | ICD-10-CM | POA: Insufficient documentation

## 2017-01-22 DIAGNOSIS — Y929 Unspecified place or not applicable: Secondary | ICD-10-CM | POA: Insufficient documentation

## 2017-01-22 DIAGNOSIS — S6981XA Other specified injuries of right wrist, hand and finger(s), initial encounter: Secondary | ICD-10-CM | POA: Diagnosis present

## 2017-01-22 DIAGNOSIS — Y999 Unspecified external cause status: Secondary | ICD-10-CM | POA: Diagnosis not present

## 2017-01-22 DIAGNOSIS — E039 Hypothyroidism, unspecified: Secondary | ICD-10-CM | POA: Insufficient documentation

## 2017-01-22 DIAGNOSIS — S6991XA Unspecified injury of right wrist, hand and finger(s), initial encounter: Secondary | ICD-10-CM

## 2017-01-22 MED ORDER — ACETAMINOPHEN 325 MG PO TABS
650.0000 mg | ORAL_TABLET | Freq: Once | ORAL | Status: AC
  Administered 2017-01-22: 650 mg via ORAL
  Filled 2017-01-22: qty 2

## 2017-01-22 NOTE — ED Triage Notes (Addendum)
Pt stated that his finger his r/middle finger was caught on the spring of his mattress frame.  Pt denies bleeding, reports throbbing. Revised statement after re-interview due to language challenge

## 2017-01-22 NOTE — ED Provider Notes (Signed)
[facility] DEPT Provider Note   CSN: [id number] Arrival date & time: 01/22/17  1027     History   Chief Complaint Chief Complaint  Patient presents with  . Extremity Laceration    middle finger r/hand, shallow laceration    HPI [name] is a 83 y.o. male.  HPI   Patient presents with throbbing pain in the right middle finger since yesterday. Patient reports he was reaching for the covers and got his finger caught and the most distal part was bent forcefully. Patient reports he tried making a splint with a small piece of metal and tape, but wanted to come have it checked out. Denies any swelling or discoloration, denies any cuts or lacerations. Patient has not taken any meds at home for this. Denies numbness or tingling, no pain in any of the other fingers hand or wrist.   Past Medical History:  Diagnosis Date  . Anxiety   . Benign pigmented nevus   . Cataract    LEFT EYE  . Depression   . Disc degeneration, lumbar   . Epigastric rebound abdominal tenderness   . Esophageal reflux   . Headache(784.0)   . Hearing loss   . Hemorrhoids   . Hiatal hernia   . Hyperlipidemia   . Hypothyroidism   . Insomnia   . OSA (obstructive sleep apnea)   . Osteoarthritis   . Reflux     Patient Active Problem List   Diagnosis Date Noted  . UNSPECIFIED HEARING LOSS 04/30/2010  . HERNIATED LUMBOSACRAL DISC 02/29/2008  . HYPERTROPHY PROSTATE W/O UR OBST & OTH LUTS 01/26/2008  . NEVUS 03/01/2007  . HYPOTHYROIDISM 12/16/2006  . HYPOGONADISM 12/16/2006  . HYPERLIPIDEMIA 12/16/2006  . OBSTRUCTIVE SLEEP APNEA 12/16/2006  . GERD 12/16/2006    Past Surgical History:  Procedure Laterality Date  . BACK SURGERY     L4-L5  . 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 CHOLECYSTECTOMY    . CORNEAL TRANSPLANT     X3 TRANSPLANT AT [facility] CATARACT SURGERY COMPLICATIONS  . EYE SURGERY         Home  Medications    Prior to Admission medications   Medication Sig Start Date End Date Taking? Authorizing Provider  albuterol (PROVENTIL HFA;VENTOLIN HFA) 108 (90 BASE) MCG/ACT inhaler Inhale 2 puffs into the lungs every 6 (six) hours as needed for wheezing or shortness of breath. 04/10/13   Lacretia Leigh, MD  ALPRAZolam Duanne Moron) 0.25 MG tablet Take 1 tablet (0.25 mg total) by mouth 2 (two) times daily as needed for anxiety. 06/12/16   Waldemar Dickens, MD  celecoxib (CELEBREX) 200 MG capsule Take 200 mg by mouth 2 (two) times daily as needed for mild pain.    [provider]  fenofibrate 160 MG tablet Take 160 mg by mouth daily.    [provider]  levothyroxine (SYNTHROID, LEVOTHROID) 100 MCG tablet Take 100 mcg by mouth daily before breakfast.    [provider]  meclizine (ANTIVERT) 25 MG tablet Take 1 tablet (25 mg total) by mouth 3 (three) times daily as needed for dizziness. Patient not taking: Reported on 03/30/2016 06/22/15   Malvin Johns, MD  oseltamivir (TAMIFLU) 75 MG capsule Take 1 capsule (75 mg total) by mouth every 12 (twelve) hours. 03/30/16   Virgel Manifold, MD  oxyCODONE-acetaminophen (PERCOCET/ROXICET) 5-325 MG tablet Take 1 tablet by mouth every 4 (four) hours as needed  for severe pain.    [provider]  prednisoLONE acetate (PRED FORTE) 1 % ophthalmic suspension  05/25/15   [provider]  pregabalin (LYRICA) 50 MG capsule Take 50 mg by mouth at bedtime.    [provider]  tizanidine (ZANAFLEX) 2 MG capsule Take 2 mg by mouth 2 (two) times daily as needed for muscle spasms.     [provider]    Family History Family History  Problem Relation Age of Onset  . Cancer Brother   . Brain cancer Sister     Social History Social History  Substance Use Topics  . Smoking status: Never Smoker  . Smokeless tobacco: Never Used  . Alcohol use No     Allergies   Patient has no known allergies.   Review of  Systems Review of Systems  Constitutional: Negative for chills and fever.  Musculoskeletal: Positive for arthralgias (R middle finger).  Skin: Negative for color change and wound.  Neurological: Negative for weakness and numbness.     Physical Exam Updated Vital Signs BP 114/74 (BP Location: Right Arm)   Pulse 77   Temp (!) 97.5 F (36.4 C) (Oral)   Resp 18   Ht 5\' 5"  (1.651 m)   Wt 73.9 kg (163 lb)   SpO2 98%   BMI 27.12 kg/m   Physical Exam  Constitutional: He appears well-developed and well-nourished. No distress.  HENT:  Head: Normocephalic and atraumatic.  Eyes: Right eye exhibits no discharge. Left eye exhibits no discharge.  Pulmonary/Chest: Effort normal. No respiratory distress.  Musculoskeletal:  Right middle finger with mild tenderness to palpation over the DIP and intermediate phalanx, minimal swelling, no erythema or warmth. Finger is held in slight flexion at rest, and patient is unable to actively extend his finger, full passive range of motion. Sensation is intact. Normal flexion against resistance. Grip strength. Good capillary refill. Normal hand and wrist exam  Neurological: He is alert. Coordination normal.  Skin: Skin is warm and dry. Capillary refill takes less than 2 seconds. He is not diaphoretic.  Psychiatric: He has a normal mood and affect. His behavior is normal.  Nursing note and vitals reviewed.    ED Treatments / Results  Labs (all labs ordered are listed, but only abnormal results are displayed) Labs Reviewed - No data to display  EKG  EKG Interpretation None       Radiology Dg Finger Middle Right  Result Date: 01/22/2017 CLINICAL DATA:  Right third finger injury yesterday EXAM: RIGHT MIDDLE FINGER 2+V COMPARISON:  None. FINDINGS: Soft tissue swelling throughout the volar right third finger, most prominent proximally. Flexion deformity at the distal interphalangeal joint of the right third finger. No fracture. No dislocation. No  suspicious focal osseous lesion. No significant arthropathy. No radiopaque foreign body. IMPRESSION: Volar right third finger soft tissue swelling, most prominent proximally. Flexion deformity at the DIP joint in the right third finger. No fracture or dislocation. Electronically Signed   By: Ilona Sorrel M.D.   On: 01/22/2017 13:05    Procedures Procedures (including critical care time)  Medications Ordered in ED Medications  acetaminophen (TYLENOL) tablet 650 mg (650 mg Oral Given 01/22/17 1303)     Initial Impression / Assessment and Plan / ED Course  I have reviewed the triage vital signs and the nursing notes.  Pertinent labs & imaging results that were available during my care of the patient were reviewed by me and considered in my medical decision making (see chart  for details).  Patient presents with pain in right middle finger after he got it caught and it was forcibly flexed.  Good capillary refill, sensation intact, flexion is intact, but patient is unable to extend at the DIP. No evidence of fracture or dislocation on x-ray. Presentation suggest mallet finger injury of the extensor tendon. Will place patient in splint with slight hyperextension, ibuprofen or Tylenol for pain. Patient to follow up with hand surgery. Return precautions discussed. Patient and family expressed understanding and are in agreement with plan.   Patient discussed with Dr. Alvino Chapel who is in agreement with plan.   Final Clinical Impressions(s) / ED Diagnoses   Final diagnoses:  Injury of finger of right hand, initial encounter  Mallet finger of right hand    New Prescriptions New Prescriptions   No medications on file     Janet Berlin 01/22/17 1326    Davonna Belling, MD 01/22/17 [phone]

## 2017-01-22 NOTE — Discharge Instructions (Signed)
You may use ibuprofen or Tylenol for pain. Remain in splint until you follow up with the hand doctor. Please schedule an appointment for follow-up with Dr. Doreatha Martin, you may also follow-up with your primary care doctor as needed. If you have worsening pain, with swelling or redness and warmth to the finger please return to the emergency department for reevaluation.

## 2017-01-29 DIAGNOSIS — C61 Malignant neoplasm of prostate: Secondary | ICD-10-CM

## 2017-01-29 HISTORY — DX: Malignant neoplasm of prostate: C61

## 2017-02-12 HISTORY — PX: TRANSURETHRAL RESECTION OF PROSTATE: SHX73

## 2017-02-23 ENCOUNTER — Other Ambulatory Visit (HOSPITAL_BASED_OUTPATIENT_CLINIC_OR_DEPARTMENT_OTHER): Payer: Self-pay

## 2017-02-23 DIAGNOSIS — G4733 Obstructive sleep apnea (adult) (pediatric): Secondary | ICD-10-CM

## 2017-03-06 ENCOUNTER — Encounter (HOSPITAL_COMMUNITY): Payer: Self-pay | Admitting: Emergency Medicine

## 2017-03-06 ENCOUNTER — Other Ambulatory Visit: Payer: Self-pay

## 2017-03-06 ENCOUNTER — Ambulatory Visit (HOSPITAL_COMMUNITY)
Admission: EM | Admit: 2017-03-06 | Discharge: 2017-03-06 | Disposition: A | Discharge status: Home / Self Care | Payer: 59 | Attending: Family Medicine | Admitting: Family Medicine

## 2017-03-06 DIAGNOSIS — F5101 Primary insomnia: Secondary | ICD-10-CM | POA: Diagnosis not present

## 2017-03-06 DIAGNOSIS — Z79899 Other long term (current) drug therapy: Secondary | ICD-10-CM | POA: Insufficient documentation

## 2017-03-06 DIAGNOSIS — G4700 Insomnia, unspecified: Secondary | ICD-10-CM | POA: Diagnosis not present

## 2017-03-06 DIAGNOSIS — F419 Anxiety disorder, unspecified: Secondary | ICD-10-CM | POA: Diagnosis not present

## 2017-03-06 DIAGNOSIS — K219 Gastro-esophageal reflux disease without esophagitis: Secondary | ICD-10-CM | POA: Insufficient documentation

## 2017-03-06 DIAGNOSIS — G4733 Obstructive sleep apnea (adult) (pediatric): Secondary | ICD-10-CM | POA: Diagnosis not present

## 2017-03-06 DIAGNOSIS — E785 Hyperlipidemia, unspecified: Secondary | ICD-10-CM | POA: Insufficient documentation

## 2017-03-06 DIAGNOSIS — R350 Frequency of micturition: Secondary | ICD-10-CM | POA: Diagnosis present

## 2017-03-06 DIAGNOSIS — M1990 Unspecified osteoarthritis, unspecified site: Secondary | ICD-10-CM | POA: Insufficient documentation

## 2017-03-06 DIAGNOSIS — E039 Hypothyroidism, unspecified: Secondary | ICD-10-CM | POA: Diagnosis not present

## 2017-03-06 DIAGNOSIS — F329 Major depressive disorder, single episode, unspecified: Secondary | ICD-10-CM | POA: Insufficient documentation

## 2017-03-06 DIAGNOSIS — N390 Urinary tract infection, site not specified: Secondary | ICD-10-CM | POA: Diagnosis not present

## 2017-03-06 LAB — POCT URINALYSIS DIP (DEVICE)
Bilirubin Urine: NEGATIVE
Glucose, UA: NEGATIVE mg/dL
Ketones, ur: NEGATIVE mg/dL
Nitrite: NEGATIVE
Protein, ur: NEGATIVE mg/dL
Specific Gravity, Urine: 1.01 (ref 1.005–1.030)
Urobilinogen, UA: 0.2 mg/dL (ref 0.0–1.0)
pH: 7 (ref 5.0–8.0)

## 2017-03-06 MED ORDER — CIPROFLOXACIN HCL 500 MG PO TABS: 500.0000 mg | ORAL_TABLET | Freq: Two times a day (BID) | ORAL | 0 refills | Status: DC

## 2017-03-06 MED ORDER — LORAZEPAM 0.5 MG PO TABS: 0.2500 mg | ORAL_TABLET | Freq: Every evening | ORAL | 1 refills | Status: DC | PRN

## 2017-03-06 NOTE — ED Provider Notes (Signed)
[facility]   [id number] 03/06/17 Arrival Time: 1127   SUBJECTIVE:  [name] is a 83 y.o. male who presents to the urgent care with complaint of "I was operated on 15 days ago on my prostate" Still having urinary frequency, last night he started peeing every hour with pain in his bladder. Denies pain at this time, only when he has to urinate.   Recently underwent prostate surgery and had to catheterize himself.  Now is going to the bathroom every hour and cannot sleep.  Patient also has an underlying sleep disorder.  He was given diphenhydramine for this and this has made it more difficult for him to pee.  Past Medical History:  Diagnosis Date  . Anxiety   . Benign pigmented nevus   . Cataract    LEFT EYE  . Depression   . Disc degeneration, lumbar   . Epigastric rebound abdominal tenderness   . Esophageal reflux   . Headache(784.0)   . Hearing loss   . Hemorrhoids   . Hiatal hernia   . Hyperlipidemia   . Hypothyroidism   . Insomnia   . OSA (obstructive sleep apnea)   . Osteoarthritis   . Reflux    Family History  Problem Relation Age of Onset  . Cancer Brother   . Brain cancer Sister    Social History   Socioeconomic History  . Marital status: Married    Spouse name: Not on file  . Number of children: Not on file  . Years of education: Not on file  . Highest education level: Not on file  Social Needs  . Financial resource strain: Not on file  . Food insecurity - worry: Not on file  . Food insecurity - inability: Not on file  . Transportation needs - medical: Not on file  . Transportation needs - non-medical: Not on file  Occupational History  . Occupation: retired    Comment: Therapist, sports in [place]  Tobacco Use  . Smoking status: Never Smoker  . Smokeless tobacco: Never Used  Substance and Sexual Activity  . Alcohol use: No  . Drug use: No  . Sexual activity: Not on file  Other Topics Concern  . Not on file  Social History Narrative  . Not  on file   Current Meds  Medication Sig  . dicyclomine (BENTYL) 10 MG capsule Take 10 mg by mouth 4 (four) times daily -  before meals and at bedtime.  Marland Kitchen oxybutynin (DITROPAN-XL) 10 MG 24 hr tablet Take 10 mg by mouth at bedtime.  . tamsulosin (FLOMAX) 0.4 MG CAPS capsule Take 0.4 mg by mouth.  . [DISCONTINUED] diphenhydrAMINE (SOMINEX) 25 MG tablet Take 25 mg by mouth at bedtime as needed for sleep.   No Known Allergies    ROS: As per HPI, remainder of ROS negative.   OBJECTIVE:   Vitals:   03/06/17 1145  BP: 120/75  Pulse: 82  Resp: 16  Temp: 98.8 F (37.1 C)  SpO2: 100%     General appearance: alert; no distress Eyes: PERRL; EOMI; conjunctiva normal HENT: normocephalic; atraumatic;, external ears normal without trauma; nasal mucosa normal; oral mucosa normal Neck: supple Abdomen: soft, non-tender; bowel sounds normal; no masses or organomegaly; no guarding or rebound tenderness Back: no CVA tenderness Extremities: no cyanosis or edema; symmetrical with no gross deformities Skin: warm and dry Neurologic: normal gait; grossly normal Psychological: alert and cooperative; normal mood and affect      Labs:  Results for orders placed  or performed during the hospital encounter of 03/06/17  POCT urinalysis dip (device)  Result Value Ref Range   Glucose, UA NEGATIVE NEGATIVE mg/dL   Bilirubin Urine NEGATIVE NEGATIVE   Ketones, ur NEGATIVE NEGATIVE mg/dL   Specific Gravity, Urine 1.010 1.005 - 1.030   Hgb urine dipstick LARGE (A) NEGATIVE   pH 7.0 5.0 - 8.0   Protein, ur NEGATIVE NEGATIVE mg/dL   Urobilinogen, UA 0.2 0.0 - 1.0 mg/dL   Nitrite NEGATIVE NEGATIVE   Leukocytes, UA SMALL (A) NEGATIVE    Labs Reviewed  POCT URINALYSIS DIP (DEVICE) - Abnormal; Notable for the following components:      Result Value   Hgb urine dipstick LARGE (*)    Leukocytes, UA SMALL (*)    All other components within normal limits  URINE CULTURE    No results  found.     ASSESSMENT & PLAN:  1. Lower urinary tract infectious disease   2. Primary insomnia     Meds ordered this encounter  Medications  . ciprofloxacin (CIPRO) 500 MG tablet    Sig: Take 1 tablet (500 mg total) by mouth 2 (two) times daily.    Dispense:  14 tablet    Refill:  0  . LORazepam (ATIVAN) 0.5 MG tablet    Sig: Take 0.5 tablets (0.25 mg total) by mouth at bedtime as needed and may repeat dose one time if needed for anxiety.    Dispense:  30 tablet    Refill:  1    Reviewed expectations re: course of current medical issues. Questions answered. Outlined signs and symptoms indicating need for more acute intervention. Patient verbalized understanding. After Visit Summary given.    Procedures:      Robyn Haber, MD 03/06/17 1225

## 2017-03-06 NOTE — ED Triage Notes (Signed)
Pt states "I was operated on 15 days ago on my prostate" Still having urinary frequency, last night he started peeing every hour with pain in his bladder. Denies pain at this time, only when he has to urinate.

## 2017-03-07 LAB — URINE CULTURE: Culture: NO GROWTH

## 2017-03-29 ENCOUNTER — Encounter (HOSPITAL_COMMUNITY): Payer: Self-pay

## 2017-03-29 ENCOUNTER — Ambulatory Visit (HOSPITAL_BASED_OUTPATIENT_CLINIC_OR_DEPARTMENT_OTHER): Payer: 59 | Attending: Physician Assistant

## 2017-03-29 ENCOUNTER — Other Ambulatory Visit: Payer: Self-pay

## 2017-03-29 ENCOUNTER — Inpatient Hospital Stay (HOSPITAL_COMMUNITY)
Admission: EM | Admit: 2017-03-29 | Discharge: 2017-04-02 | DRG: 698 | Disposition: A | Discharge status: Home / Self Care | Payer: 59 | Attending: Family Medicine | Admitting: Family Medicine

## 2017-03-29 DIAGNOSIS — H9190 Unspecified hearing loss, unspecified ear: Secondary | ICD-10-CM | POA: Diagnosis present

## 2017-03-29 DIAGNOSIS — Z9049 Acquired absence of other specified parts of digestive tract: Secondary | ICD-10-CM

## 2017-03-29 DIAGNOSIS — Y846 Urinary catheterization as the cause of abnormal reaction of the patient, or of later complication, without mention of misadventure at the time of the procedure: Secondary | ICD-10-CM | POA: Diagnosis present

## 2017-03-29 DIAGNOSIS — Z947 Corneal transplant status: Secondary | ICD-10-CM | POA: Diagnosis not present

## 2017-03-29 DIAGNOSIS — G4733 Obstructive sleep apnea (adult) (pediatric): Secondary | ICD-10-CM | POA: Diagnosis present

## 2017-03-29 DIAGNOSIS — K219 Gastro-esophageal reflux disease without esophagitis: Secondary | ICD-10-CM | POA: Diagnosis present

## 2017-03-29 DIAGNOSIS — E039 Hypothyroidism, unspecified: Secondary | ICD-10-CM | POA: Diagnosis present

## 2017-03-29 DIAGNOSIS — I10 Essential (primary) hypertension: Secondary | ICD-10-CM | POA: Diagnosis present

## 2017-03-29 DIAGNOSIS — N390 Urinary tract infection, site not specified: Secondary | ICD-10-CM

## 2017-03-29 DIAGNOSIS — N12 Tubulo-interstitial nephritis, not specified as acute or chronic: Secondary | ICD-10-CM

## 2017-03-29 DIAGNOSIS — R0600 Dyspnea, unspecified: Secondary | ICD-10-CM

## 2017-03-29 DIAGNOSIS — R7881 Bacteremia: Secondary | ICD-10-CM | POA: Diagnosis not present

## 2017-03-29 DIAGNOSIS — A419 Sepsis, unspecified organism: Secondary | ICD-10-CM

## 2017-03-29 DIAGNOSIS — R0609 Other forms of dyspnea: Secondary | ICD-10-CM | POA: Diagnosis not present

## 2017-03-29 DIAGNOSIS — Z9079 Acquired absence of other genital organ(s): Secondary | ICD-10-CM | POA: Diagnosis not present

## 2017-03-29 DIAGNOSIS — F419 Anxiety disorder, unspecified: Secondary | ICD-10-CM | POA: Diagnosis present

## 2017-03-29 DIAGNOSIS — G4700 Insomnia, unspecified: Secondary | ICD-10-CM | POA: Diagnosis present

## 2017-03-29 DIAGNOSIS — E860 Dehydration: Secondary | ICD-10-CM | POA: Diagnosis present

## 2017-03-29 DIAGNOSIS — C61 Malignant neoplasm of prostate: Secondary | ICD-10-CM | POA: Diagnosis present

## 2017-03-29 DIAGNOSIS — E785 Hyperlipidemia, unspecified: Secondary | ICD-10-CM | POA: Diagnosis present

## 2017-03-29 DIAGNOSIS — A4150 Gram-negative sepsis, unspecified: Secondary | ICD-10-CM | POA: Diagnosis present

## 2017-03-29 DIAGNOSIS — F329 Major depressive disorder, single episode, unspecified: Secondary | ICD-10-CM | POA: Diagnosis present

## 2017-03-29 DIAGNOSIS — T83511A Infection and inflammatory reaction due to indwelling urethral catheter, initial encounter: Secondary | ICD-10-CM | POA: Diagnosis present

## 2017-03-29 DIAGNOSIS — N179 Acute kidney failure, unspecified: Secondary | ICD-10-CM | POA: Diagnosis present

## 2017-03-29 DIAGNOSIS — E876 Hypokalemia: Secondary | ICD-10-CM | POA: Diagnosis not present

## 2017-03-29 LAB — URINALYSIS, ROUTINE W REFLEX MICROSCOPIC
Bilirubin Urine: NEGATIVE
Glucose, UA: NEGATIVE mg/dL
Ketones, ur: NEGATIVE mg/dL
Nitrite: NEGATIVE
Protein, ur: 100 mg/dL — AB
Specific Gravity, Urine: 1.021 (ref 1.005–1.030)
pH: 5 (ref 5.0–8.0)

## 2017-03-29 LAB — HEMOGLOBIN A1C
Hgb A1c MFr Bld: 5 % (ref 4.8–5.6)
Mean Plasma Glucose: 96.8 mg/dL

## 2017-03-29 LAB — CBC WITH DIFFERENTIAL/PLATELET
Basophils Absolute: 0 10*3/uL (ref 0.0–0.1)
Basophils Relative: 0 %
Eosinophils Absolute: 0 10*3/uL (ref 0.0–0.7)
Eosinophils Relative: 0 %
HCT: 40.6 % (ref 39.0–52.0)
Hemoglobin: 14.1 g/dL (ref 13.0–17.0)
Lymphocytes Relative: 4 %
Lymphs Abs: 0.7 10*3/uL (ref 0.7–4.0)
MCH: 31.4 pg (ref 26.0–34.0)
MCHC: 34.7 g/dL (ref 30.0–36.0)
MCV: 90.4 fL (ref 78.0–100.0)
Monocytes Absolute: 1.6 10*3/uL — ABNORMAL HIGH (ref 0.1–1.0)
Monocytes Relative: 8 %
Neutro Abs: 17.9 10*3/uL — ABNORMAL HIGH (ref 1.7–7.7)
Neutrophils Relative %: 88 %
Platelets: 191 10*3/uL (ref 150–400)
RBC: 4.49 MIL/uL (ref 4.22–5.81)
RDW: 13.9 % (ref 11.5–15.5)
WBC: 20.3 10*3/uL — ABNORMAL HIGH (ref 4.0–10.5)

## 2017-03-29 LAB — COMPREHENSIVE METABOLIC PANEL
ALT: 23 U/L (ref 17–63)
AST: 26 U/L (ref 15–41)
Albumin: 3.9 g/dL (ref 3.5–5.0)
Alkaline Phosphatase: 39 U/L (ref 38–126)
Anion gap: 7 (ref 5–15)
BUN: 33 mg/dL — ABNORMAL HIGH (ref 6–20)
CO2: 25 mmol/L (ref 22–32)
Calcium: 9.7 mg/dL (ref 8.9–10.3)
Chloride: 103 mmol/L (ref 101–111)
Creatinine, Ser: 1.96 mg/dL — ABNORMAL HIGH (ref 0.61–1.24)
GFR calc Af Amer: 35 mL/min — ABNORMAL LOW (ref >60)
GFR calc non Af Amer: 30 mL/min — ABNORMAL LOW (ref >60)
Glucose, Bld: 113 mg/dL — ABNORMAL HIGH (ref 65–99)
Potassium: 3 mmol/L — ABNORMAL LOW (ref 3.5–5.1)
Sodium: 135 mmol/L (ref 135–145)
Total Bilirubin: 1.6 mg/dL — ABNORMAL HIGH (ref 0.3–1.2)
Total Protein: 7.5 g/dL (ref 6.5–8.1)

## 2017-03-29 LAB — LACTIC ACID, PLASMA: Lactic Acid, Venous: 1.1 mmol/L (ref 0.5–1.9)

## 2017-03-29 LAB — TSH: TSH: 0.8 u[IU]/mL (ref 0.350–4.500)

## 2017-03-29 LAB — MAGNESIUM: Magnesium: 2.1 mg/dL (ref 1.7–2.4)

## 2017-03-29 MED ORDER — ONDANSETRON HCL 4 MG PO TABS: 4.0000 mg | ORAL_TABLET | Freq: Four times a day (QID) | ORAL | Status: DC | PRN

## 2017-03-29 MED ORDER — POTASSIUM CHLORIDE CRYS ER 20 MEQ PO TBCR
40.0000 meq | EXTENDED_RELEASE_TABLET | Freq: Once | ORAL | Status: AC
  Administered 2017-03-29: 40 meq via ORAL
  Filled 2017-03-29: qty 2

## 2017-03-29 MED ORDER — ACETAMINOPHEN 325 MG PO TABS
650.0000 mg | ORAL_TABLET | Freq: Once | ORAL | Status: AC
  Administered 2017-03-29: 650 mg via ORAL
  Filled 2017-03-29: qty 2

## 2017-03-29 MED ORDER — SODIUM CHLORIDE 0.9 % IV BOLUS (SEPSIS)
1000.0000 mL | Freq: Once | INTRAVENOUS | Status: AC
  Administered 2017-03-29: 1000 mL via INTRAVENOUS

## 2017-03-29 MED ORDER — FENOFIBRATE 160 MG PO TABS: 160.0000 mg | ORAL_TABLET | Freq: Every day | ORAL | Status: DC

## 2017-03-29 MED ORDER — ALPRAZOLAM 0.25 MG PO TABS
0.2500 mg | ORAL_TABLET | Freq: Two times a day (BID) | ORAL | Status: DC | PRN
  Administered 2017-03-29 – 2017-04-01 (×4): 0.25 mg via ORAL
  Filled 2017-03-29 (×4): qty 1

## 2017-03-29 MED ORDER — DEXTROSE 5 % IV SOLN
1.0000 g | INTRAVENOUS | Status: DC
  Administered 2017-03-30: 1 g via INTRAVENOUS
  Filled 2017-03-29: qty 10

## 2017-03-29 MED ORDER — POTASSIUM CHLORIDE 10 MEQ/100ML IV SOLN
10.0000 meq | INTRAVENOUS | Status: AC
Start: 2017-03-29 — End: 2017-03-29
  Administered 2017-03-29 (×2): 10 meq via INTRAVENOUS
  Filled 2017-03-29 (×2): qty 100

## 2017-03-29 MED ORDER — ONDANSETRON HCL 4 MG/2ML IJ SOLN: 4.0000 mg | Freq: Four times a day (QID) | INTRAMUSCULAR | Status: DC | PRN

## 2017-03-29 MED ORDER — MAGNESIUM SULFATE 2 GM/50ML IV SOLN
2.0000 g | Freq: Once | INTRAVENOUS | Status: AC
  Administered 2017-03-29: 2 g via INTRAVENOUS
  Filled 2017-03-29: qty 50

## 2017-03-29 MED ORDER — CEFTRIAXONE SODIUM 1 G IJ SOLR
1.0000 g | Freq: Once | INTRAMUSCULAR | Status: AC
  Administered 2017-03-29: 1 g via INTRAVENOUS
  Filled 2017-03-29: qty 10

## 2017-03-29 MED ORDER — HEPARIN SODIUM (PORCINE) 5000 UNIT/ML IJ SOLN
5000.0000 [IU] | Freq: Three times a day (TID) | INTRAMUSCULAR | Status: DC
  Administered 2017-03-29 – 2017-04-02 (×12): 5000 [IU] via SUBCUTANEOUS
  Filled 2017-03-29 (×12): qty 1

## 2017-03-29 MED ORDER — SODIUM CHLORIDE 0.9 % IV SOLN
INTRAVENOUS | Status: DC
  Administered 2017-03-29 – 2017-03-31 (×5): via INTRAVENOUS

## 2017-03-29 MED ORDER — ACETAMINOPHEN 325 MG PO TABS
650.0000 mg | ORAL_TABLET | Freq: Four times a day (QID) | ORAL | Status: DC | PRN
  Administered 2017-03-30 – 2017-03-31 (×2): 650 mg via ORAL
  Filled 2017-03-29 (×2): qty 2

## 2017-03-29 MED ORDER — ACETAMINOPHEN 650 MG RE SUPP: 650.0000 mg | Freq: Four times a day (QID) | RECTAL | Status: DC | PRN

## 2017-03-29 MED ORDER — PANTOPRAZOLE SODIUM 40 MG PO TBEC
40.0000 mg | DELAYED_RELEASE_TABLET | Freq: Every day | ORAL | Status: DC
  Administered 2017-03-30 – 2017-04-02 (×4): 40 mg via ORAL
  Filled 2017-03-29 (×4): qty 1

## 2017-03-29 MED ORDER — HYDRALAZINE HCL 20 MG/ML IJ SOLN: 10.0000 mg | Freq: Four times a day (QID) | INTRAMUSCULAR | Status: DC | PRN

## 2017-03-29 MED ORDER — LEVOTHYROXINE SODIUM 100 MCG PO TABS
100.0000 ug | ORAL_TABLET | Freq: Every day | ORAL | Status: DC
  Administered 2017-03-30 – 2017-04-02 (×4): 100 ug via ORAL
  Filled 2017-03-29 (×4): qty 1

## 2017-03-29 NOTE — H&P (Signed)
History and Physical    Prospero Mahnke OHY:[id number] DOB: June 03, 1932 DOA: 03/29/2017  PCP: Kristopher Glee., MD   I have briefly reviewed patients previous medical reports in [facility].  Patient coming from: Home  Chief Complaint: Abdominal pain, nausea, increased frequency and dysuria  HPI: [name] is a 83 year old Hispanic male with a past medical history significant for anxiety, hypertension, hyperlipidemia, hypothyroidism, gastroesophageal reflux disease and prostate cancer (recently diagnosed and actively follow by urology service at [facility]); who presented to the emergency department with complaints of abdominal pain and dysuria. Patient's pain is localized in suprapubic area, slightly radiated to his groin, crampy in nature, without worsening or relieving factors; and associated with nausea/dysuria and incontinence.  Patient at the end of last month has had a partial transurethral prostatectomy and biopsy that demonstrated prostate cancer; since then he had sudden difficulty blood in the healing time with urinary retention and the need for Foley catheter utilization.  He finally had catheter removed on December 27 and was doing okay since then on June 28 at night when he started noticing increased pain in his lower abdomen, dysuria and difficulty urinating; patient also reported some intermittent chills and increased frequency.  Patient came to the ED for further management and treatment. He was found meeting criteria for sepsis due to UTI.  ED Course: IV fluids initiated, blood culture and urine culture taken; IV Rocephin given while in the ED.  Patient met sepsis criteria with elevated temperature, tachycardia, elevated WBCs, slight increase in his respiratory rate (up to 23), source for infection identified as the urine and also demonstrating son acute kidney injury.  [facility] hospitalist has been called to admit the patient for further evaluation and treatment.  Review  of Systems:  All other systems reviewed and apart from HPI, are negative.  Past Medical History:  Diagnosis Date  . Anxiety   . Benign pigmented nevus   . Cataract    LEFT EYE  . Depression   . Disc degeneration, lumbar   . Epigastric rebound abdominal tenderness   . Esophageal reflux   . Headache(784.0)   . Hearing loss   . Hemorrhoids   . Hiatal hernia   . Hyperlipidemia   . Hypothyroidism   . Insomnia   . OSA (obstructive sleep apnea)   . Osteoarthritis   . Reflux     Past Surgical History:  Procedure Laterality Date  . BACK SURGERY     L4-L5  . 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 CHOLECYSTECTOMY    . CORNEAL TRANSPLANT     X3 TRANSPLANT AT [facility] CATARACT SURGERY COMPLICATIONS  . EYE SURGERY      Social History  reports that  has never smoked. he has never used smokeless tobacco. He reports that he does not drink alcohol or use drugs.  No Known Allergies  Family History  Problem Relation Age of Onset  . Cancer Brother   . Brain cancer Sister      Prior to Admission medications   Medication Sig Start Date End Date Taking? Authorizing Provider  esomeprazole (NEXIUM) 40 MG capsule Take 40 mg by mouth daily. 07/28/16  Yes [provider]  fenofibrate 160 MG tablet Take 160 mg by mouth daily.   Yes [provider]  levothyroxine (SYNTHROID, LEVOTHROID) 100 MCG tablet Take 100 mcg by mouth daily before breakfast.   Yes [provider]  valsartan-hydrochlorothiazide (DIOVAN-HCT) 320-25 MG tablet Take 1 tablet by mouth daily. 07/17/16  Yes [provider]    Physical Exam: Vitals:   03/29/17 1134 03/29/17 1136 03/29/17 1200 03/29/17 1241  BP: (!) 146/89  134/78 (!) 109/56  Pulse: (!) 109  (!) 114 (!) 113  Resp:  (!) 28 (!) 31 (!) 24  Temp:    99 F (37.2 C)  TempSrc:    Oral  SpO2: 97%  96% 98%  Weight:    71.8 kg (158 lb 3.2 oz)  Height:    '5\' 5"'  (1.651 m)    Constitutional: Warm to touch, in no acute distress, alert, awake and oriented x3; denying chest pain shortness of breath.  Patient reporting pain in his suprapubic area and also complaining of increased frequency, incontinence and dysuria. Eyes: PERTLA, lids and conjunctivae normal, no icterus, ENMT: Mucous membranes slightly dry on exam; Posterior pharynx clear of any exudate or lesions. Normal dentition.  Neck: supple, no masses, no thyromegaly, no JVD Respiratory: clear to auscultation bilaterally, no wheezing, no crackles. Normal respiratory effort. No accessory muscle use.  Cardiovascular: S1 and S2, regular rate and rhythm, no murmurs, no gallops, no rubs.  No carotid bruits appreciated on exam. Abdomen: Significant tenderness to palpation on his suprapubic area, no distention no guarding, positive bowel sounds. Musculoskeletal: no clubbing / cyanosis. No joint deformity upper and lower extremities. Good ROM, no contractures. Normal muscle tone.  Skin: no rashes, lesions, ulcers. No induration Neurologic: CN 2-12 grossly intact. Sensation intact, DTR normal. Strength 5/5 in all 4 limbs.  Psychiatric: Normal judgment and insight. Alert and oriented x 3. Normal mood.   Labs on Admission: I have personally reviewed following labs and imaging studies  CBC: Recent Labs  Lab 03/29/17 0854  WBC 20.3*  NEUTROABS 17.9*  HGB 14.1  HCT 40.6  MCV 90.4  PLT 856   Basic Metabolic Panel: Recent Labs  Lab 03/29/17 0854 03/29/17 1024  NA 135  --   K 3.0*  --   CL 103  --   CO2 25  --   GLUCOSE 113*  --   BUN 33*  --   CREATININE 1.96*  --   CALCIUM 9.7  --   MG  --  2.1   Liver Function Tests: Recent Labs  Lab 03/29/17 0854  AST 26  ALT 23  ALKPHOS 39  BILITOT 1.6*  PROT 7.5  ALBUMIN 3.9   Urine analysis:    Component Value Date/Time   COLORURINE AMBER (A) 03/29/2017 0910   APPEARANCEUR CLOUDY (A) 03/29/2017 0910   LABSPEC 1.021 03/29/2017 0910   PHURINE 5.0  03/29/2017 0910   GLUCOSEU NEGATIVE 03/29/2017 0910   GLUCOSEU negative 04/02/2007   HGBUR MODERATE (A) 03/29/2017 0910   HGBUR negative 01/26/2008 0856   BILIRUBINUR NEGATIVE 03/29/2017 0910   KETONESUR NEGATIVE 03/29/2017 0910   PROTEINUR 100 (A) 03/29/2017 0910   UROBILINOGEN 0.2 03/06/2017 1154   NITRITE NEGATIVE 03/29/2017 0910   LEUKOCYTESUR LARGE (A) 03/29/2017 0910   EKG:  -None  Assessment/Plan 1-sepsis (HCC) due to Foley associated UTI: -Patient met sepsis criteria on admission with elevated temperature, tachycardia, respiratory rate of 23, WBCs of 20,000 and positive source for infection which is urine. -IV fluids initiated in the ED, blood cultures and urine culture taken -Patient has been started on Rocephin IV -Continue as needed antipyretics and analgesics -will also use PRN antiemetics.  -Patient with recent instrumentations after partial transurethral prostate resection and the needs for  Foley catheter due to acute urinary retention.  Patient catheter was removed on the 27th of this month and his symptoms started around the 28th. -Follow clinical response. -Depending his improvement/progression might require urology consultation while inpatient.  Patient is actively seen at [facility] from the urology department and has an upcoming appointment on 7 January.  2-hypothyroidism -Will check TSH -Continue Synthroid  3-HLD (hyperlipidemia) -continue fenofibrate   4-GERD -continue PPI  5-Hypokalemia -Due to decreased p.o. intake and continue use of diuretics -Diuretic has been discontinued at this moment -Encourage to maintain adequate hydration and oral intake -Magnesium has been checked and is within normal limits -Potassium has been repleted and will follow trend.  6-Benign essential HTN -Fair control -Holding ARB and HCTZ acutely in the setting of acute kidney injury -will use hydralazine as needed for high blood pressure -If required will start Norvasc for  BP control.  7-Prostate cancer [facility]) -Recent partial transurethral prostatectomy demonstrated prostate cancer -Patient to continue follow-up at [facility] with urology service. -upcoming appointment on January 7th  8-AKI -Secondary to UTI, dehydration and continue use of ARB and HCTZ. -Nephrotoxic agents has been placed on hold, will provide fluid resuscitation, continue treatment for UTI and follow renal function trend.     Time: 70 minutes   DVT prophylaxis: Heparin Code Status: Full code Family Communication: Son and wife at bedside. Disposition Plan: Anticipate discharge back Home once medically stable. Consults called: None Admission status: Inpatient, length of stay more than 2 midnights, telemetry bed.   Barton Dubois MD [facility] Pager [phone]  If 7PM-7AM, please contact night-coverage [email or web address] Password [email or web address]  03/29/2017, 12:47 PM

## 2017-03-29 NOTE — Progress Notes (Signed)
Pt arrived to 3W, VSS, paged Dr for more orders; DR wanted Pt on cardiac monitoring. Bed placement notified; new room assignment was rec'd for Pt on tele floor.

## 2017-03-29 NOTE — ED Provider Notes (Signed)
Emergency Department Provider Note   I have reviewed the triage vital signs and the nursing notes.   HISTORY  Chief Complaint Urinary Frequency   HPI [name] is a 83 y.o. male with multiple medical problems as documented below the presents to the emergency department today secondary to urinary frequency, suprapubic abdominal pain for the last couple days.  Patient states that he had urinary retention in the past and thought this was similar to that.  He will have troubles in his underwear that are uncontrolled.  But he also has suprapubic abdominal pain that radiates to bilateral flanks.  States is been going on for last couple days.  He is also had some relief when he does urinate he has had increased frequency and increased volume of urine during this time as well.  Has felt chills off and on but no measured fevers.  Has had intermittent nausea but no vomiting. No other associated or modifying symptoms.   Past Medical History:  Diagnosis Date  . Anxiety   . Benign pigmented nevus   . Cataract    LEFT EYE  . Depression   . Disc degeneration, lumbar   . Epigastric rebound abdominal tenderness   . Esophageal reflux   . Headache(784.0)   . Hearing loss   . Hemorrhoids   . Hiatal hernia   . Hyperlipidemia   . Hypothyroidism   . Insomnia   . OSA (obstructive sleep apnea)   . Osteoarthritis   . Reflux     Patient Active Problem List   Diagnosis Date Noted  . UNSPECIFIED HEARING LOSS 04/30/2010  . HERNIATED LUMBOSACRAL DISC 02/29/2008  . HYPERTROPHY PROSTATE W/O UR OBST & OTH LUTS 01/26/2008  . NEVUS 03/01/2007  . HYPOTHYROIDISM 12/16/2006  . HYPOGONADISM 12/16/2006  . HYPERLIPIDEMIA 12/16/2006  . OBSTRUCTIVE SLEEP APNEA 12/16/2006  . GERD 12/16/2006    Past Surgical History:  Procedure Laterality Date  . BACK SURGERY     L4-L5  . 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 CHOLECYSTECTOMY    . CORNEAL  TRANSPLANT     X3 TRANSPLANT AT [facility] CATARACT SURGERY COMPLICATIONS  . EYE SURGERY      Current Outpatient Rx  . Order #: [id number] Class: Historical Med  . Order #: [id number] Class: Historical Med  . Order #: [id number] Class: Historical Med  . Order #: [id number] Class: Historical Med    Allergies Patient has no known allergies.  Family History  Problem Relation Age of Onset  . Cancer Brother   . Brain cancer Sister     Social History Social History   Tobacco Use  . Smoking status: Never Smoker  . Smokeless tobacco: Never Used  Substance Use Topics  . Alcohol use: No  . Drug use: No    Review of Systems  All other systems negative except as documented in the HPI. All pertinent positives and negatives as reviewed in the HPI. ____________________________________________   PHYSICAL EXAM:  VITAL SIGNS: ED Triage Vitals  Enc Vitals Group     BP 03/29/17 0833 104/79     Pulse Rate 03/29/17 0833 (!) 107     Resp 03/29/17 0833 16     Temp 03/29/17 0833 99 F (37.2 C)     Temp Source 03/29/17 0833 Oral     SpO2 03/29/17 0833 98 %     Weight 03/29/17 0845 163 lb (73.9 kg)     Height 03/29/17  0845 5\' 5"  (1.651 m)    Constitutional: Alert and oriented. Well appearing and in no acute distress. Eyes: Conjunctivae are normal. PERRL. EOMI. Head: Atraumatic. Nose: No congestion/rhinnorhea. Mouth/Throat: Mucous membranes are moist.  Oropharynx non-erythematous. Neck: No stridor.  No meningeal signs.   Cardiovascular: tachycardic rate, regular rhythm. Good peripheral circulation. Grossly normal heart sounds.   Respiratory: Normal respiratory effort.  No retractions. Lungs CTAB. Gastrointestinal: Soft but with suprapubic ttp. No distention.  Musculoskeletal: No lower extremity tenderness nor edema. No gross deformities of extremities. Neurologic:  Normal speech and language. No gross focal neurologic deficits are appreciated.  Skin:  Skin is warm, dry and intact. No  rash noted.   ____________________________________________   LABS (all labs ordered are listed, but only abnormal results are displayed)  Labs Reviewed  CBC WITH DIFFERENTIAL/PLATELET - Abnormal; Notable for the following components:      Result Value   WBC 20.3 (*)    Neutro Abs 17.9 (*)    Monocytes Absolute 1.6 (*)    All other components within normal limits  COMPREHENSIVE METABOLIC PANEL - Abnormal; Notable for the following components:   Potassium 3.0 (*)    Glucose, Bld 113 (*)    BUN 33 (*)    Creatinine, Ser 1.96 (*)    Total Bilirubin 1.6 (*)    GFR calc non Af Amer 30 (*)    GFR calc Af Amer 35 (*)    All other components within normal limits  URINALYSIS, ROUTINE W REFLEX MICROSCOPIC - Abnormal; Notable for the following components:   Color, Urine AMBER (*)    APPearance CLOUDY (*)    Hgb urine dipstick MODERATE (*)    Protein, ur 100 (*)    Leukocytes, UA LARGE (*)    Bacteria, UA MANY (*)    Squamous Epithelial / LPF 0-5 (*)    All other components within normal limits  CULTURE, BLOOD (ROUTINE X 2)  CULTURE, BLOOD (ROUTINE X 2)  URINE CULTURE  LACTIC ACID, PLASMA  LACTIC ACID, PLASMA  MAGNESIUM   ____________________________________________  PROCEDURES  Procedure(s) performed:   Procedures   ____________________________________________   INITIAL IMPRESSION / ASSESSMENT AND PLAN / ED COURSE  Suspect UTI with dehydration vs sepsis and possibly early pyelonephritis. Will eval accordingly. Less likely urinary retention.   Patient found to have a rectal temperature of 101.6 making sepsis distinct possibility especially with a infection on his urinalysis and white blood cell count of 20 and mild tachycardia.  Cultures were drawn antibiotics started.  Lactic acid added on.  Will plan for admission for same. Labs reviewed and show hypokalemia along with AKI as well. Fluids, potassium, Mg given. Mg level added on.  Pertinent labs & imaging results that  were available during my care of the patient were reviewed by me and considered in my medical decision making (see chart for details).  ____________________________________________  FINAL CLINICAL IMPRESSION(S) / ED DIAGNOSES  Final diagnoses:  Pyelonephritis  Sepsis, due to unspecified organism ([place])  Hypokalemia     MEDICATIONS GIVEN DURING THIS VISIT:  Medications  cefTRIAXone (ROCEPHIN) 1 g in dextrose 5 % 50 mL IVPB (1 g Intravenous New Bag/Given 03/29/17 1035)  potassium chloride 10 mEq in 100 mL IVPB (not administered)  potassium chloride SA (K-DUR,KLOR-CON) CR tablet 40 mEq (not administered)  magnesium sulfate IVPB 2 g 50 mL (not administered)  sodium chloride 0.9 % bolus 1,000 mL (1,000 mLs Intravenous New Bag/Given 03/29/17 0922)  acetaminophen (TYLENOL) tablet 650 mg (650  mg Oral Given 03/29/17 1024)     NEW OUTPATIENT MEDICATIONS STARTED DURING THIS VISIT:  This SmartLink is deprecated. Use AVSMEDLIST instead to display the medication list for a patient.  Note:  This note was prepared with assistance of Dragon voice recognition software. Occasional wrong-word or sound-a-like substitutions may have occurred due to the inherent limitations of voice recognition software.   Merrily Pew, MD 03/29/17 [phone]

## 2017-03-29 NOTE — ED Triage Notes (Signed)
Pt states that he is having pain in penis and suprapubic area as well as constantly going to restroom but only urinating a little. Pt speaks no english, son and daughter in law are with pt and are ok with translating.

## 2017-03-29 NOTE — Progress Notes (Signed)
Called for report; ED RN to return call. Note for throughput tracking purposes only.

## 2017-03-29 NOTE — ED Notes (Signed)
ED TO INPATIENT HANDOFF REPORT  Name/Age/Gender [facility] 83 y.o. male  Code Status Code Status History    This patient does not have a recorded code status. Please follow your organizational policy for patients in this situation.      Home/SNF/Other Home  Chief Complaint urinary retention  Level of Care/Admitting Diagnosis ED Disposition    ED Disposition Condition [facility] Area: [place] [[id number]]  Level of Care: Med-Surg [16]  Diagnosis: Sepsis secondary to UTI [facility]) [[id number]]  Admitting Physician: Barton Dubois [[id number]]  Attending Physician: Barton Dubois [[id number]]  Estimated length of stay: past midnight tomorrow  Certification:: I certify this patient will need inpatient services for at least 2 midnights  PT Class (Do Not Modify): Inpatient [101]  PT Acc Code (Do Not Modify): Private [1]       Medical History Past Medical History:  Diagnosis Date  . Anxiety   . Benign pigmented nevus   . Cataract    LEFT EYE  . Depression   . Disc degeneration, lumbar   . Epigastric rebound abdominal tenderness   . Esophageal reflux   . Headache(784.0)   . Hearing loss   . Hemorrhoids   . Hiatal hernia   . Hyperlipidemia   . Hypothyroidism   . Insomnia   . OSA (obstructive sleep apnea)   . Osteoarthritis   . Reflux     Allergies No Known Allergies  IV Location/Drains/Wounds Patient Lines/Drains/Airways Status   Active Line/Drains/Airways    Name:   Placement date:   Placement time:   Site:   Days:   Peripheral IV 03/29/17 Right Antecubital   03/29/17    0921    Antecubital   less than 1   Peripheral IV 03/29/17 Right Hand   03/29/17    1124    Hand   less than 1          Labs/Imaging Results for orders placed or performed during the hospital encounter of 03/29/17 (from the past 48 hour(s))  CBC with Differential     Status: Abnormal   Collection Time: 03/29/17  8:54 AM  Result Value Ref Range   WBC 20.3 (H) 4.0 -  10.5 K/uL   RBC 4.49 4.22 - 5.81 MIL/uL   Hemoglobin 14.1 13.0 - 17.0 g/dL   HCT 40.6 39.0 - 52.0 %   MCV 90.4 78.0 - 100.0 fL   MCH 31.4 26.0 - 34.0 pg   MCHC 34.7 30.0 - 36.0 g/dL   RDW 13.9 11.5 - 15.5 %   Platelets 191 150 - 400 K/uL   Neutrophils Relative % 88 %   Neutro Abs 17.9 (H) 1.7 - 7.7 K/uL   Lymphocytes Relative 4 %   Lymphs Abs 0.7 0.7 - 4.0 K/uL   Monocytes Relative 8 %   Monocytes Absolute 1.6 (H) 0.1 - 1.0 K/uL   Eosinophils Relative 0 %   Eosinophils Absolute 0.0 0.0 - 0.7 K/uL   Basophils Relative 0 %   Basophils Absolute 0.0 0.0 - 0.1 K/uL  Comprehensive metabolic panel     Status: Abnormal   Collection Time: 03/29/17  8:54 AM  Result Value Ref Range   Sodium 135 135 - 145 mmol/L   Potassium 3.0 (L) 3.5 - 5.1 mmol/L   Chloride 103 101 - 111 mmol/L   CO2 25 22 - 32 mmol/L   Glucose, Bld 113 (H) 65 - 99 mg/dL   BUN 33 (H) 6 - 20 mg/dL  Creatinine, Ser 1.96 (H) 0.61 - 1.24 mg/dL   Calcium 9.7 8.9 - 10.3 mg/dL   Total Protein 7.5 6.5 - 8.1 g/dL   Albumin 3.9 3.5 - 5.0 g/dL   AST 26 15 - 41 U/L   ALT 23 17 - 63 U/L   Alkaline Phosphatase 39 38 - 126 U/L   Total Bilirubin 1.6 (H) 0.3 - 1.2 mg/dL   GFR calc non Af Amer 30 (L) >60 mL/min   GFR calc Af Amer 35 (L) >60 mL/min    Comment: (NOTE) The eGFR has been calculated using the CKD EPI equation. This calculation has not been validated in all clinical situations. eGFR's persistently <60 mL/min signify possible Chronic Kidney Disease.    Anion gap 7 5 - 15  Urinalysis, Routine w reflex microscopic     Status: Abnormal   Collection Time: 03/29/17  9:10 AM  Result Value Ref Range   Color, Urine AMBER (A) YELLOW    Comment: BIOCHEMICALS MAY BE AFFECTED BY COLOR   APPearance CLOUDY (A) CLEAR   Specific Gravity, Urine 1.021 1.005 - 1.030   pH 5.0 5.0 - 8.0   Glucose, UA NEGATIVE NEGATIVE mg/dL   Hgb urine dipstick MODERATE (A) NEGATIVE   Bilirubin Urine NEGATIVE NEGATIVE   Ketones, ur NEGATIVE  NEGATIVE mg/dL   Protein, ur 100 (A) NEGATIVE mg/dL   Nitrite NEGATIVE NEGATIVE   Leukocytes, UA LARGE (A) NEGATIVE   RBC / HPF 6-30 0 - 5 RBC/hpf   WBC, UA TOO NUMEROUS TO COUNT 0 - 5 WBC/hpf   Bacteria, UA MANY (A) NONE SEEN   Squamous Epithelial / LPF 0-5 (A) NONE SEEN   WBC Clumps PRESENT    Mucus PRESENT    Hyaline Casts, UA PRESENT   Lactic acid, plasma     Status: None   Collection Time: 03/29/17 10:05 AM  Result Value Ref Range   Lactic Acid, Venous 1.1 0.5 - 1.9 mmol/L  Magnesium     Status: None   Collection Time: 03/29/17 10:24 AM  Result Value Ref Range   Magnesium 2.1 1.7 - 2.4 mg/dL   No results found.  Pending Labs Unresulted Labs (From admission, onward)   Start     Ordered   03/29/17 1023  Urine culture  Add-on,   STAT     03/29/17 1022   03/29/17 0854  Blood culture (routine x 2)  BLOOD CULTURE X 2,   STAT     03/29/17 0854      Vitals/Pain Today's Vitals   03/29/17 1100 03/29/17 1110 03/29/17 1134 03/29/17 1136  BP: 128/77  (!) 146/89   Pulse: (!) 101  (!) 109   Resp: 13   (!) 28  Temp:      TempSrc:      SpO2: 100%  97%   Weight:      Height:      PainSc:  5       Isolation Precautions No active isolations  Medications Medications  potassium chloride 10 mEq in 100 mL IVPB (10 mEq Intravenous New Bag/Given 03/29/17 1123)  magnesium sulfate IVPB 2 g 50 mL (2 g Intravenous New Bag/Given 03/29/17 1133)  sodium chloride 0.9 % bolus 1,000 mL (1,000 mLs Intravenous New Bag/Given 03/29/17 6979)  acetaminophen (TYLENOL) tablet 650 mg (650 mg Oral Given 03/29/17 1024)  cefTRIAXone (ROCEPHIN) 1 g in dextrose 5 % 50 mL IVPB (0 g Intravenous Stopped 03/29/17 1116)  potassium chloride SA (K-DUR,KLOR-CON) CR tablet 40 mEq (40  mEq Oral Given 03/29/17 1115)    Mobility walks with person assist

## 2017-03-30 LAB — URINE CULTURE

## 2017-03-30 LAB — BASIC METABOLIC PANEL
Anion gap: 5 (ref 5–15)
BUN: 30 mg/dL — ABNORMAL HIGH (ref 6–20)
CO2: 22 mmol/L (ref 22–32)
Calcium: 8.4 mg/dL — ABNORMAL LOW (ref 8.9–10.3)
Chloride: 108 mmol/L (ref 101–111)
Creatinine, Ser: 1.5 mg/dL — ABNORMAL HIGH (ref 0.61–1.24)
GFR calc Af Amer: 48 mL/min — ABNORMAL LOW (ref >60)
GFR calc non Af Amer: 42 mL/min — ABNORMAL LOW (ref >60)
Glucose, Bld: 95 mg/dL (ref 65–99)
Potassium: 3.7 mmol/L (ref 3.5–5.1)
Sodium: 135 mmol/L (ref 135–145)

## 2017-03-30 LAB — BLOOD CULTURE ID PANEL (REFLEXED)

## 2017-03-30 LAB — CBC
HCT: 32.5 % — ABNORMAL LOW (ref 39.0–52.0)
Hemoglobin: 11 g/dL — ABNORMAL LOW (ref 13.0–17.0)
MCH: 31 pg (ref 26.0–34.0)
MCHC: 33.8 g/dL (ref 30.0–36.0)
MCV: 91.5 fL (ref 78.0–100.0)
Platelets: 153 10*3/uL (ref 150–400)
RBC: 3.55 MIL/uL — ABNORMAL LOW (ref 4.22–5.81)
RDW: 14.2 % (ref 11.5–15.5)
WBC: 14.4 10*3/uL — ABNORMAL HIGH (ref 4.0–10.5)

## 2017-03-30 MED ORDER — DEXTROSE 5 % IV SOLN
2.0000 g | INTRAVENOUS | Status: DC
  Administered 2017-03-31 – 2017-04-01 (×2): 2 g via INTRAVENOUS
  Filled 2017-03-30 (×2): qty 2

## 2017-03-30 NOTE — Progress Notes (Signed)
PHARMACY - PHYSICIAN COMMUNICATION CRITICAL VALUE ALERT - BLOOD CULTURE IDENTIFICATION (BCID)  [name] is an 83 y.o. male who presented to [facility] on 03/29/2017 with a chief complaint of catheter associated UTI with possible sepsis  Assessment:  GNR in 1 of 4 bottles in blood with nothing reported on BCID  Name of physician (or Provider) Contacted: Lama  Current antibiotics: Rocephin 1g daily  Changes to prescribed antibiotics recommended:  Changed Rocephin to 2g IV daily  Results for orders placed or performed during the hospital encounter of 03/29/17  Blood Culture ID Panel (Reflexed) (Collected: 03/29/2017  8:54 AM)  Result Value Ref Range   Enterococcus species NOT DETECTED NOT DETECTED   Listeria monocytogenes NOT DETECTED NOT DETECTED   Staphylococcus species NOT DETECTED NOT DETECTED   Staphylococcus aureus NOT DETECTED NOT DETECTED   Streptococcus species NOT DETECTED NOT DETECTED   Streptococcus agalactiae NOT DETECTED NOT DETECTED   Streptococcus pneumoniae NOT DETECTED NOT DETECTED   Streptococcus pyogenes NOT DETECTED NOT DETECTED   Acinetobacter baumannii NOT DETECTED NOT DETECTED   Enterobacteriaceae species NOT DETECTED NOT DETECTED   Enterobacter cloacae complex NOT DETECTED NOT DETECTED   Escherichia coli NOT DETECTED NOT DETECTED   Klebsiella oxytoca NOT DETECTED NOT DETECTED   Klebsiella pneumoniae NOT DETECTED NOT DETECTED   Proteus species NOT DETECTED NOT DETECTED   Serratia marcescens NOT DETECTED NOT DETECTED   Haemophilus influenzae NOT DETECTED NOT DETECTED   Neisseria meningitidis NOT DETECTED NOT DETECTED   Pseudomonas aeruginosa NOT DETECTED NOT DETECTED   Candida albicans NOT DETECTED NOT DETECTED   Candida glabrata NOT DETECTED NOT DETECTED   Candida krusei NOT DETECTED NOT DETECTED   Candida parapsilosis NOT DETECTED NOT DETECTED   Candida tropicalis NOT DETECTED NOT DETECTED    [name] 03/30/2017  3:28 PM

## 2017-03-30 NOTE — Progress Notes (Signed)
[facility]  PROGRESS NOTE  [name] DEY:[id number] DOB: 14-Sep-1932 DOA: 03/29/2017 PCP: Kristopher Glee., MD   Brief HPI:    83 year old Hispanic male with a past medical history significant for anxiety, hypertension, hyperlipidemia, hypothyroidism, gastroesophageal reflux disease and prostate cancer (recently diagnosed and actively follow by urology service at [facility]); who presented to the emergency department with complaints of abdominal pain and dysuria. Patient's pain is localized in suprapubic area, slightly radiated to his groin, crampy in nature, without worsening or relieving factors; and associated with nausea/dysuria and incontinence.  Patient at the end of last month has had a partial transurethral prostatectomy and biopsy that demonstrated prostate cancer; since then he had sudden difficulty blood in the healing time with urinary retention and the need for Foley catheter utilization.  He finally had catheter removed on December 27 and was doing okay since then on June 28 at night when he started noticing increased pain in his lower abdomen, dysuria and difficulty urinating; patient also reported some intermittent chills and increased frequency.  Patient came to the ED for further management and treatment. He was found meeting criteria for sepsis due to UTI.     Subjective   Patient seen and examined, denies abdominal pain. Denies dysuria. WBC has improved from 20,000-14,000. He is afebrile now.   Assessment/Plan:     1. Sepsis due to catheter associated UTI- patient presented with sepsis, started on IV ceftriaxone. Sepsis physiology has resolved. Blood pressure still in upper 90s. Continue IV Rocephin. Urine culture growing multiple species likely from previous antibiotics use of his outpatient. Will await blood culture results. 2. Hypothyroidism-continue Synthroid. TSH 0.800 3. Hypokalemia- replete 4. Hypertension- SPEP in 90s, continue to hold ARB,  HCTZ 5. Acute kidney injury- likely from use of ARB and HCTZ. Creatinine has improved 1.50. Continue IV normal saline at 100 ML per hour. 6. Anxiety/insomnia- continue alprazolam 0.25 mg by mouth every 12 hours when necessary    DVT prophylaxis: Heparin  Code Status: Full code  Family Communication: * Discussed with patient's daughter , who acted as interpreter that and wife at bedside  Disposition Plan: likely home in 1-2 days    Consultants:  None  Procedures:  None   l Anti-infectives (From admission, onward)   Start     Dose/Rate Route Frequency Ordered Stop   03/30/17 1000  cefTRIAXone (ROCEPHIN) 1 g in dextrose 5 % 50 mL IVPB     1 g 100 mL/hr over 30 Minutes Intravenous Every 24 hours 03/29/17 1245     03/29/17 1015  cefTRIAXone (ROCEPHIN) 1 g in dextrose 5 % 50 mL IVPB     1 g 100 mL/hr over 30 Minutes Intravenous  Once 03/29/17 1005 03/29/17 1116       Objective   Vitals:   03/29/17 1241 03/29/17 1511 03/29/17 2135 03/30/17 0525  BP: (!) 109/56 (!) 102/56 (!) 95/58 (!) 97/59  Pulse: (!) 113 (!) 104 79 73  Resp: (!) 24 18 18 18   Temp: 99 F (37.2 C) 98.4 F (36.9 C) 97.9 F (36.6 C) 97.9 F (36.6 C)  TempSrc: Oral Oral Oral Oral  SpO2: 98% 98% 100% 97%  Weight: 71.8 kg (158 lb 3.2 oz)     Height: 5\' 5"  (1.651 m)       Intake/Output Summary (Last 24 hours) at 03/30/2017 1330 Last data filed at 03/30/2017 1008 Gross per 24 hour  Intake 153.33 ml  Output 845 ml  Net -691.67 ml   Danley Danker  Weights   03/29/17 0845 03/29/17 1241  Weight: 73.9 kg (163 lb) 71.8 kg (158 lb 3.2 oz)     Physical Examination:   Physical Exam: Eyes: No icterus, extraocular muscles intact  Mouth: Oral mucosa is moist, no lesions on palate,  Neck: Supple, no deformities, masses, or tenderness Lungs: Normal respiratory effort, bilateral clear to auscultation, no crackles or wheezes.  Heart: Regular rate and rhythm, S1 and S2 normal, no murmurs, rubs  auscultated Abdomen: BS normoactive,soft,nondistended,non-tender to palpation,no organomegaly Extremities: No pretibial edema, no erythema, no cyanosis, no clubbing Neuro : Alert and oriented to time, place and person, No focal deficits  Skin: No rashes seen on exam     Data Reviewed: I have personally reviewed following labs and imaging studies  CBG: No results for input(s): GLUCAP in the last 168 hours.  CBC: Recent Labs  Lab 03/29/17 0854 03/30/17 0537  WBC 20.3* 14.4*  NEUTROABS 17.9*  --   HGB 14.1 11.0*  HCT 40.6 32.5*  MCV 90.4 91.5  PLT 191 341    Basic Metabolic Panel: Recent Labs  Lab 03/29/17 0854 03/29/17 1024 03/30/17 0537  NA 135  --  135  K 3.0*  --  3.7  CL 103  --  108  CO2 25  --  22  GLUCOSE 113*  --  95  BUN 33*  --  30*  CREATININE 1.96*  --  1.50*  CALCIUM 9.7  --  8.4*  MG  --  2.1  --     Recent Results (from the past 240 hour(s))  Urine culture     Status: Abnormal   Collection Time: 03/29/17  8:21 AM  Result Value Ref Range Status   Specimen Description URINE, RANDOM  Final   Special Requests NONE  Final   Culture MULTIPLE SPECIES PRESENT, SUGGEST RECOLLECTION (A)  Final   Report Status 03/30/2017 FINAL  Final  Blood culture (routine x 2)     Status: None (Preliminary result)   Collection Time: 03/29/17  8:54 AM  Result Value Ref Range Status   Specimen Description BLOOD RIGHT ANTECUBITAL  Final   Special Requests   Final    BOTTLES DRAWN AEROBIC AND ANAEROBIC Blood Culture adequate volume   Culture   Final    NO GROWTH < 24 HOURS Performed at [facility], [address], [place], [place] [address]    Report Status PENDING  Incomplete  Blood culture (routine x 2)     Status: None (Preliminary result)   Collection Time: 03/29/17  8:59 AM  Result Value Ref Range Status   Specimen Description BLOOD LEFT ANTECUBITAL  Final   Special Requests   Final    BOTTLES DRAWN AEROBIC AND ANAEROBIC Blood Culture adequate volume    Culture   Final    NO GROWTH < 24 HOURS Performed at [facility], [place] [address], [place], [place] [address]    Report Status PENDING  Incomplete     Liver Function Tests: Recent Labs  Lab 03/29/17 0854  AST 26  ALT 23  ALKPHOS 39  BILITOT 1.6*  PROT 7.5  ALBUMIN 3.9   No results for input(s): LIPASE, AMYLASE in the last 168 hours. No results for input(s): AMMONIA in the last 168 hours.  Cardiac Enzymes: No results for input(s): CKTOTAL, CKMB, CKMBINDEX, TROPONINI in the last 168 hours. BNP (last 3 results) No results for input(s): BNP in the last 8760 hours.  ProBNP (last 3 results) No  results for input(s): PROBNP in the last 8760 hours.    Studies: No results found.  Scheduled Meds: . heparin  5,000 Units Subcutaneous Q8H  . levothyroxine  100 mcg Oral QAC breakfast  . pantoprazole  40 mg Oral Daily      Time spent: 20 min  Oswald Hillock   [facility] Pager [phone]. If 7PM-7AM, please contact night-coverage at [email or web address], Office  [phone]  password [email or web address]  03/30/2017, 1:30 PM  LOS: 1 day

## 2017-03-31 DIAGNOSIS — R7881 Bacteremia: Secondary | ICD-10-CM

## 2017-03-31 LAB — CBC
HCT: 33.7 % — ABNORMAL LOW (ref 39.0–52.0)
Hemoglobin: 11.4 g/dL — ABNORMAL LOW (ref 13.0–17.0)
MCH: 30.6 pg (ref 26.0–34.0)
MCHC: 33.8 g/dL (ref 30.0–36.0)
MCV: 90.6 fL (ref 78.0–100.0)
Platelets: 162 10*3/uL (ref 150–400)
RBC: 3.72 MIL/uL — ABNORMAL LOW (ref 4.22–5.81)
RDW: 13.9 % (ref 11.5–15.5)
WBC: 10.5 10*3/uL (ref 4.0–10.5)

## 2017-03-31 LAB — BASIC METABOLIC PANEL
Anion gap: 6 (ref 5–15)
BUN: 17 mg/dL (ref 6–20)
CO2: 21 mmol/L — ABNORMAL LOW (ref 22–32)
Calcium: 8.4 mg/dL — ABNORMAL LOW (ref 8.9–10.3)
Chloride: 109 mmol/L (ref 101–111)
Creatinine, Ser: 1.17 mg/dL (ref 0.61–1.24)
GFR calc Af Amer: >60 mL/min (ref >60)
GFR calc non Af Amer: 56 mL/min — ABNORMAL LOW (ref >60)
Glucose, Bld: 96 mg/dL (ref 65–99)
Potassium: 3.4 mmol/L — ABNORMAL LOW (ref 3.5–5.1)
Sodium: 136 mmol/L (ref 135–145)

## 2017-03-31 MED ORDER — IRBESARTAN 300 MG PO TABS
300.0000 mg | ORAL_TABLET | Freq: Every day | ORAL | Status: DC
  Administered 2017-03-31 – 2017-04-02 (×3): 300 mg via ORAL
  Filled 2017-03-31 (×3): qty 1

## 2017-03-31 MED ORDER — AMLODIPINE BESYLATE 5 MG PO TABS
5.0000 mg | ORAL_TABLET | Freq: Every day | ORAL | Status: DC
  Administered 2017-03-31 – 2017-04-02 (×3): 5 mg via ORAL
  Filled 2017-03-31 (×3): qty 1

## 2017-03-31 MED ORDER — POTASSIUM CHLORIDE CRYS ER 20 MEQ PO TBCR
20.0000 meq | EXTENDED_RELEASE_TABLET | Freq: Once | ORAL | Status: AC
  Administered 2017-03-31: 20 meq via ORAL
  Filled 2017-03-31: qty 1

## 2017-03-31 MED ORDER — TAMSULOSIN HCL 0.4 MG PO CAPS
0.4000 mg | ORAL_CAPSULE | Freq: Every day | ORAL | Status: DC
  Administered 2017-03-31 – 2017-04-02 (×3): 0.4 mg via ORAL
  Filled 2017-03-31 (×3): qty 1

## 2017-03-31 NOTE — Progress Notes (Addendum)
[facility]  PROGRESS NOTE  Turhan Chill PXT:[id number] DOB: 1932/09/18 DOA: 03/29/2017 PCP: Kristopher Glee., MD   Brief HPI:    84 year old Hispanic male with a past medical history significant for anxiety, hypertension, hyperlipidemia, hypothyroidism, gastroesophageal reflux disease and prostate cancer (recently diagnosed and actively follow by urology service at [facility]); who presented to the emergency department with complaints of abdominal pain and dysuria. Patient's pain is localized in suprapubic area, slightly radiated to his groin, crampy in nature, without worsening or relieving factors; and associated with nausea/dysuria and incontinence.  Patient at the end of last month has had a partial transurethral prostatectomy and biopsy that demonstrated prostate cancer; since then he had sudden difficulty blood in the healing time with urinary retention and the need for Foley catheter utilization.  He finally had catheter removed on December 27 and was doing okay since then on June 28 at night when he started noticing increased pain in his lower abdomen, dysuria and difficulty urinating; patient also reported some intermittent chills and increased frequency.  Patient came to the ED for further management and treatment. He was found meeting criteria for sepsis due to UTI.     Subjective   Patient seen and examined, complains of urgency of urination. Denies dysuria. WBC is down to 10.5. Communicated with help of tele interpretor.   Assessment/Plan:     1. Sepsis due to catheter associated UTI- patient presented with sepsis, started on IV ceftriaxone. Sepsis physiology has resolved. Blood pressure is now elevated . Continue IV Rocephin. Urine culture growing multiple species likely from previous antibiotics use of his outpatient. Blood cultures one out of four bottles going gram-negative rods. 2. Gram-negative rod bacteremia-blood cultures one out of four bottles growing  gram-negative rods. Final result is pending. Continue IV ceftriaxone. 3. Hypothyroidism-continue Synthroid. TSH 0.800 4. Prostate cancer- status post TURP, will add Flomax for urgency. 5. Hypokalemia-will replace potassium intake BMP in am. 6. Hypertension-  blood pressure is now elevated, will restart Diovan, discontinue HCTZ. Also start amlodipine 5 mg PO daily 7. Acute kidney injury- likely from use of ARB and HCTZ. Creatinine has improved 1.17. Continue IV normal saline at 100 ML per hour. 8. Anxiety/insomnia- continue alprazolam 0.25 mg by mouth every 12 hours when necessary    DVT prophylaxis: Heparin  Code Status: Full code  Family Communication: * Discussed with patient's daughter on 03/30/17 , who acted as interpreter that and wife at bedside  Disposition Plan: likely home in 1-2 days    Consultants:  None  Procedures:  None   l Anti-infectives (From admission, onward)   Start     Dose/Rate Route Frequency Ordered Stop   03/31/17 0800  cefTRIAXone (ROCEPHIN) 2 g in dextrose 5 % 50 mL IVPB     2 g 100 mL/hr over 30 Minutes Intravenous Every 24 hours 03/30/17 1501     03/30/17 1000  cefTRIAXone (ROCEPHIN) 1 g in dextrose 5 % 50 mL IVPB  Status:  Discontinued     1 g 100 mL/hr over 30 Minutes Intravenous Every 24 hours 03/29/17 1245 03/30/17 1500   03/29/17 1015  cefTRIAXone (ROCEPHIN) 1 g in dextrose 5 % 50 mL IVPB     1 g 100 mL/hr over 30 Minutes Intravenous  Once 03/29/17 1005 03/29/17 1116       Objective   Vitals:   03/30/17 1440 03/30/17 2142 03/31/17 0531 03/31/17 1542  BP: 137/81 133/66 113/61 (!) 157/62  Pulse: 81 83 69 71  Resp:  18 20 18 18   Temp: 98.3 F (36.8 C) 99.5 F (37.5 C) 98.9 F (37.2 C) 98.4 F (36.9 C)  TempSrc: Oral Oral Oral Oral  SpO2: 99% 99% 99% 100%  Weight:      Height:        Intake/Output Summary (Last 24 hours) at 03/31/2017 1649 Last data filed at 03/31/2017 1542 Gross per 24 hour  Intake 5270 ml  Output 2560 ml   Net 2710 ml   Filed Weights   03/29/17 0845 03/29/17 1241  Weight: 73.9 kg (163 lb) 71.8 kg (158 lb 3.2 oz)     Physical Examination:   Physical Exam: Eyes: No icterus, extraocular muscles intact  Mouth: Oral mucosa is moist, no lesions on palate,  Neck: Supple, no deformities, masses, or tenderness Lungs: Normal respiratory effort, bilateral clear to auscultation, no crackles or wheezes.  Heart: Regular rate and rhythm, S1 and S2 normal, no murmurs, rubs auscultated Abdomen: BS normoactive,soft,nondistended,non-tender to palpation,no organomegaly Extremities: No pretibial edema, no erythema, no cyanosis, no clubbing Neuro : Alert and oriented to time, place and person, No focal deficits  Skin: No rashes seen on exam     Data Reviewed: I have personally reviewed following labs and imaging studies  CBG: No results for input(s): GLUCAP in the last 168 hours.  CBC: Recent Labs  Lab 03/29/17 0854 03/30/17 0537 03/31/17 0934  WBC 20.3* 14.4* 10.5  NEUTROABS 17.9*  --   --   HGB 14.1 11.0* 11.4*  HCT 40.6 32.5* 33.7*  MCV 90.4 91.5 90.6  PLT 191 153 517    Basic Metabolic Panel: Recent Labs  Lab 03/29/17 0854 03/29/17 1024 03/30/17 0537 03/31/17 0934  NA 135  --  135 136  K 3.0*  --  3.7 3.4*  CL 103  --  108 109  CO2 25  --  22 21*  GLUCOSE 113*  --  95 96  BUN 33*  --  30* 17  CREATININE 1.96*  --  1.50* 1.17  CALCIUM 9.7  --  8.4* 8.4*  MG  --  2.1  --   --     Recent Results (from the past 240 hour(s))  Urine culture     Status: Abnormal   Collection Time: 03/29/17  8:21 AM  Result Value Ref Range Status   Specimen Description URINE, RANDOM  Final   Special Requests NONE  Final   Culture MULTIPLE SPECIES PRESENT, SUGGEST RECOLLECTION (A)  Final   Report Status 03/30/2017 FINAL  Final  Blood culture (routine x 2)     Status: Abnormal (Preliminary result)   Collection Time: 03/29/17  8:54 AM  Result Value Ref Range Status   Specimen Description  BLOOD RIGHT ANTECUBITAL  Final   Special Requests   Final    BOTTLES DRAWN AEROBIC AND ANAEROBIC Blood Culture adequate volume   Culture  Setup Time   Final    GRAM NEGATIVE RODS AEROBIC BOTTLE ONLY CRITICAL RESULT CALLED TO, READ BACK BY AND VERIFIED WITH: Christean Grief Pharm.D. 15:25 03/30/17 ([email or web address])    Culture (A)  Final    STENOTROPHOMONAS MALTOPHILIA SUSCEPTIBILITIES TO FOLLOW Performed at [facility], [place] [address] [place], [place] [address]    Report Status PENDING  Incomplete  Blood Culture ID Panel (Reflexed)     Status: None   Collection Time: 03/29/17  8:54 AM  Result Value Ref Range Status   Enterococcus species NOT DETECTED NOT DETECTED Final   Listeria monocytogenes NOT DETECTED  NOT DETECTED Final   Staphylococcus species NOT DETECTED NOT DETECTED Final   Staphylococcus aureus NOT DETECTED NOT DETECTED Final   Streptococcus species NOT DETECTED NOT DETECTED Final   Streptococcus agalactiae NOT DETECTED NOT DETECTED Final   Streptococcus pneumoniae NOT DETECTED NOT DETECTED Final   Streptococcus pyogenes NOT DETECTED NOT DETECTED Final   Acinetobacter baumannii NOT DETECTED NOT DETECTED Final   Enterobacteriaceae species NOT DETECTED NOT DETECTED Final   Enterobacter cloacae complex NOT DETECTED NOT DETECTED Final   Escherichia coli NOT DETECTED NOT DETECTED Final   Klebsiella oxytoca NOT DETECTED NOT DETECTED Final   Klebsiella pneumoniae NOT DETECTED NOT DETECTED Final   Proteus species NOT DETECTED NOT DETECTED Final   Serratia marcescens NOT DETECTED NOT DETECTED Final   Haemophilus influenzae NOT DETECTED NOT DETECTED Final   Neisseria meningitidis NOT DETECTED NOT DETECTED Final   Pseudomonas aeruginosa NOT DETECTED NOT DETECTED Final   Candida albicans NOT DETECTED NOT DETECTED Final   Candida glabrata NOT DETECTED NOT DETECTED Final   Candida krusei NOT DETECTED NOT DETECTED Final   Candida parapsilosis NOT DETECTED NOT DETECTED Final   Candida  tropicalis NOT DETECTED NOT DETECTED Final    Comment: Performed at [facility], [place] [address], [place], [place] [address]  Blood culture (routine x 2)     Status: None (Preliminary result)   Collection Time: 03/29/17  8:59 AM  Result Value Ref Range Status   Specimen Description BLOOD LEFT ANTECUBITAL  Final   Special Requests   Final    BOTTLES DRAWN AEROBIC AND ANAEROBIC Blood Culture adequate volume   Culture   Final    NO GROWTH 2 DAYS Performed at [facility], [place] [address] [place], [place] [address]    Report Status PENDING  Incomplete     Liver Function Tests: Recent Labs  Lab 03/29/17 0854  AST 26  ALT 23  ALKPHOS 39  BILITOT 1.6*  PROT 7.5  ALBUMIN 3.9   No results for input(s): LIPASE, AMYLASE in the last 168 hours. No results for input(s): AMMONIA in the last 168 hours.  Cardiac Enzymes: No results for input(s): CKTOTAL, CKMB, CKMBINDEX, TROPONINI in the last 168 hours. BNP (last 3 results) No results for input(s): BNP in the last 8760 hours.  ProBNP (last 3 results) No results for input(s): PROBNP in the last 8760 hours.    Studies: No results found.  Scheduled Meds: . heparin  5,000 Units Subcutaneous Q8H  . levothyroxine  100 mcg Oral QAC breakfast  . pantoprazole  40 mg Oral Daily  . tamsulosin  0.4 mg Oral QPC breakfast      Time spent: 20 min  [facility] Pager [phone]. If 7PM-7AM, please contact night-coverage at [email or web address], Office  [phone]  password [email or web address]  03/31/2017, 4:49 PM  LOS: 2 days

## 2017-04-01 ENCOUNTER — Inpatient Hospital Stay (HOSPITAL_COMMUNITY): Payer: 59

## 2017-04-01 LAB — CULTURE, BLOOD (ROUTINE X 2): Special Requests: ADEQUATE

## 2017-04-01 LAB — CBC
HCT: 31 % — ABNORMAL LOW (ref 39.0–52.0)
Hemoglobin: 10.6 g/dL — ABNORMAL LOW (ref 13.0–17.0)
MCH: 30.6 pg (ref 26.0–34.0)
MCHC: 34.2 g/dL (ref 30.0–36.0)
MCV: 89.6 fL (ref 78.0–100.0)
Platelets: 167 10*3/uL (ref 150–400)
RBC: 3.46 MIL/uL — ABNORMAL LOW (ref 4.22–5.81)
RDW: 13.9 % (ref 11.5–15.5)
WBC: 8.8 10*3/uL (ref 4.0–10.5)

## 2017-04-01 LAB — BASIC METABOLIC PANEL
Anion gap: 4 — ABNORMAL LOW (ref 5–15)
BUN: 15 mg/dL (ref 6–20)
CO2: 22 mmol/L (ref 22–32)
Calcium: 8.4 mg/dL — ABNORMAL LOW (ref 8.9–10.3)
Chloride: 111 mmol/L (ref 101–111)
Creatinine, Ser: 1.28 mg/dL — ABNORMAL HIGH (ref 0.61–1.24)
GFR calc Af Amer: 58 mL/min — ABNORMAL LOW (ref >60)
GFR calc non Af Amer: 50 mL/min — ABNORMAL LOW (ref >60)
Glucose, Bld: 90 mg/dL (ref 65–99)
Potassium: 3.5 mmol/L (ref 3.5–5.1)
Sodium: 137 mmol/L (ref 135–145)

## 2017-04-01 MED ORDER — LEVOFLOXACIN 750 MG PO TABS
750.0000 mg | ORAL_TABLET | ORAL | Status: DC
  Administered 2017-04-01: 750 mg via ORAL
  Filled 2017-04-01 (×2): qty 1

## 2017-04-01 NOTE — Plan of Care (Signed)
  Clinical Measurements: Ability to maintain clinical measurements within normal limits will improve 04/01/2017 2255 - Progressing by Ashley Murrain, RN   Clinical Measurements: Will remain free from infection 04/01/2017 2255 - Progressing by Ashley Murrain, RN   Activity: Risk for activity intolerance will decrease 04/01/2017 2255 - Progressing by Ashley Murrain, RN

## 2017-04-01 NOTE — Progress Notes (Addendum)
[facility]  PROGRESS NOTE  [name] DGL:[id number] DOB: [birth year] DOA: 03/29/2017   PCP: Kristopher Glee., MD  Brief HPI:   84 year old Hispanic male with a past medical history significant for anxiety, hypertension, hyperlipidemia, hypothyroidism, gastroesophageal reflux disease and prostate cancer (recently diagnosed and actively follow by urology service at [facility]); who presented to the emergency department with complaints of abdominal pain and dysuria.   Subjective   Pt reports feeling better this AM, still with some urinary difficulty and burning but overall better.    Assessment/Plan:   1. Sepsis due to catheter associated UTI, bacteremia- patient presented with sepsis, started on IV ceftriaxone. Sepsis physiology has resolved. Urine culture growing multiple species likely from previous antibiotics use outpatient. Blood cultures one out of four bottles growing stenotrophomonas. D/W pharmacy, will change ABX to PO Levaquin. If pt tolerating well, can likely be discharged in AM. Repeat blood cultures in AM to ensure clearance.  2. Hypothyroidism-TSH 0.800, continue synthroid  3. Prostate cancer- status post TURP, Flomax added  4. Hypokalemia - supplemented and WNL  5. Hypertension- reasonable inpatient control, continue Diovan, Norvasc 6. Acute kidney injury- likely from use of ARB and HCTZ. Cr up a bit this AM, will repeat BMP in AM 7. Anxiety/insomnia- continue alprazolam 0.25 mg by mouth every 12 hours when necessary 8. Exertional dyspnea - pt raised concerned about intermittent dyspnea and wheezing, no acute findings of physical exam, will ask for CXR, use IS as needed   DVT prophylaxis: Heparin Code Status: Full code Family Communication: d/w family at bedside  Disposition Plan: home in AM  Consultants:  None  Procedures:  None l Anti-infectives (From admission, onward)   Start     Dose/Rate Route Frequency Ordered Stop   04/01/17 1030   levofloxacin (LEVAQUIN) tablet 750 mg     750 mg Oral Every 48 hours 04/01/17 0943     03/31/17 0800  cefTRIAXone (ROCEPHIN) 2 g in dextrose 5 % 50 mL IVPB  Status:  Discontinued     2 g 100 mL/hr over 30 Minutes Intravenous Every 24 hours 03/30/17 1501 04/01/17 0943   03/30/17 1000  cefTRIAXone (ROCEPHIN) 1 g in dextrose 5 % 50 mL IVPB  Status:  Discontinued     1 g 100 mL/hr over 30 Minutes Intravenous Every 24 hours 03/29/17 1245 03/30/17 1500   03/29/17 1015  cefTRIAXone (ROCEPHIN) 1 g in dextrose 5 % 50 mL IVPB     1 g 100 mL/hr over 30 Minutes Intravenous  Once 03/29/17 1005 03/29/17 1116      Objective   Vitals:   03/31/17 1542 03/31/17 2050 04/01/17 0601 04/01/17 0942  BP: (!) 157/62 137/73 128/70 125/74  Pulse: 71 74 65   Resp: 18 18 18    Temp: 98.4 F (36.9 C) 98.1 F (36.7 C) 97.8 F (36.6 C)   TempSrc: Oral Oral Oral   SpO2: 100% 99% 100%   Weight:      Height:        Intake/Output Summary (Last 24 hours) at 04/01/2017 1520 Last data filed at 04/01/2017 1256 Gross per 24 hour  Intake 900 ml  Output 1950 ml  Net -1050 ml   Filed Weights   03/29/17 0845 03/29/17 1241  Weight: 73.9 kg (163 lb) 71.8 kg (158 lb 3.2 oz)     Physical Exam  Constitutional: Appears well-developed and well-nourished. No distress.  CVS: RRR, S1/S2 +, no murmurs, no gallops, no carotid bruit.  Pulmonary: Effort and breath  sounds normal, no stridor, rhonchi, wheezes, rales.  Abdominal: Soft. BS +,  no distension, tenderness, rebound or guarding.  Musculoskeletal: Normal range of motion. No edema and no tenderness.  Lymphadenopathy: No lymphadenopathy noted, cervical, inguinal. Neuro: Alert. Normal reflexes, muscle tone coordination. No cranial nerve deficit.  Data Reviewed: I have personally reviewed following blood work   CBC: Recent Labs  Lab 03/29/17 0854 03/30/17 0537 03/31/17 0934 04/01/17 0506  WBC 20.3* 14.4* 10.5 8.8  NEUTROABS 17.9*  --   --   --   HGB 14.1 11.0*  11.4* 10.6*  HCT 40.6 32.5* 33.7* 31.0*  MCV 90.4 91.5 90.6 89.6  PLT 191 153 162 408    Basic Metabolic Panel: Recent Labs  Lab 03/29/17 0854 03/29/17 1024 03/30/17 0537 03/31/17 0934 04/01/17 0506  NA 135  --  135 136 137  K 3.0*  --  3.7 3.4* 3.5  CL 103  --  108 109 111  CO2 25  --  22 21* 22  GLUCOSE 113*  --  95 96 90  BUN 33*  --  30* 17 15  CREATININE 1.96*  --  1.50* 1.17 1.28*  CALCIUM 9.7  --  8.4* 8.4* 8.4*  MG  --  2.1  --   --   --     Recent Results (from the past 240 hour(s))  Urine culture     Status: Abnormal   Collection Time: 03/29/17  8:21 AM  Result Value Ref Range Status   Specimen Description URINE, RANDOM  Final   Special Requests NONE  Final   Culture MULTIPLE SPECIES PRESENT, SUGGEST RECOLLECTION (A)  Final   Report Status 03/30/2017 FINAL  Final  Blood culture (routine x 2)     Status: Abnormal   Collection Time: 03/29/17  8:54 AM  Result Value Ref Range Status   Specimen Description BLOOD RIGHT ANTECUBITAL  Final   Special Requests   Final    BOTTLES DRAWN AEROBIC AND ANAEROBIC Blood Culture adequate volume   Culture  Setup Time   Final    GRAM NEGATIVE RODS AEROBIC BOTTLE ONLY CRITICAL RESULT CALLED TO, READ BACK BY AND VERIFIED WITH: Christean Grief Pharm.D. 15:25 03/30/17 ([email or web address]) Performed at [facility], [place] [address], [place], [place] [address]    Culture STENOTROPHOMONAS MALTOPHILIA (A)  Final   Report Status 04/01/2017 FINAL  Final   Organism ID, Bacteria STENOTROPHOMONAS MALTOPHILIA  Final      Susceptibility   Stenotrophomonas maltophilia - MIC*    LEVOFLOXACIN 0.5 SENSITIVE Sensitive     TRIMETH/SULFA <=20 SENSITIVE Sensitive     * STENOTROPHOMONAS MALTOPHILIA  Blood Culture ID Panel (Reflexed)     Status: None   Collection Time: 03/29/17  8:54 AM  Result Value Ref Range Status   Enterococcus species NOT DETECTED NOT DETECTED Final   Listeria monocytogenes NOT DETECTED NOT DETECTED Final   Staphylococcus species NOT  DETECTED NOT DETECTED Final   Staphylococcus aureus NOT DETECTED NOT DETECTED Final   Streptococcus species NOT DETECTED NOT DETECTED Final   Streptococcus agalactiae NOT DETECTED NOT DETECTED Final   Streptococcus pneumoniae NOT DETECTED NOT DETECTED Final   Streptococcus pyogenes NOT DETECTED NOT DETECTED Final   Acinetobacter baumannii NOT DETECTED NOT DETECTED Final   Enterobacteriaceae species NOT DETECTED NOT DETECTED Final   Enterobacter cloacae complex NOT DETECTED NOT DETECTED Final   Escherichia coli NOT DETECTED NOT DETECTED Final   Klebsiella oxytoca NOT DETECTED NOT DETECTED Final   Klebsiella pneumoniae  NOT DETECTED NOT DETECTED Final   Proteus species NOT DETECTED NOT DETECTED Final   Serratia marcescens NOT DETECTED NOT DETECTED Final   Haemophilus influenzae NOT DETECTED NOT DETECTED Final   Neisseria meningitidis NOT DETECTED NOT DETECTED Final   Pseudomonas aeruginosa NOT DETECTED NOT DETECTED Final   Candida albicans NOT DETECTED NOT DETECTED Final   Candida glabrata NOT DETECTED NOT DETECTED Final   Candida krusei NOT DETECTED NOT DETECTED Final   Candida parapsilosis NOT DETECTED NOT DETECTED Final   Candida tropicalis NOT DETECTED NOT DETECTED Final    Comment: Performed at [facility], [place] [address], [place], [place] [address]  Blood culture (routine x 2)     Status: None (Preliminary result)   Collection Time: 03/29/17  8:59 AM  Result Value Ref Range Status   Specimen Description BLOOD LEFT ANTECUBITAL  Final   Special Requests   Final    BOTTLES DRAWN AEROBIC AND ANAEROBIC Blood Culture adequate volume   Culture   Final    NO GROWTH 3 DAYS Performed at [facility], [place] [address] [place], [place] [address]    Report Status PENDING  Incomplete     Liver Function Tests: Recent Labs  Lab 03/29/17 0854  AST 26  ALT 23  ALKPHOS 39  BILITOT 1.6*  PROT 7.5  ALBUMIN 3.9   Studies: No results found.  Scheduled Meds: . amLODipine  5  mg Oral Daily  . heparin  5,000 Units Subcutaneous Q8H  . irbesartan  300 mg Oral Daily  . levofloxacin  750 mg Oral Q48H  . levothyroxine  100 mcg Oral QAC breakfast  . pantoprazole  40 mg Oral Daily  . tamsulosin  0.4 mg Oral QPC breakfast    Time spent: 25 min  [facility] Pager [phone]. If 7PM-7AM, please contact night-coverage at [email or web address], Office  [phone]  password [email or web address]  04/01/2017, 3:20 PM  LOS: 3 days

## 2017-04-02 DIAGNOSIS — A419 Sepsis, unspecified organism: Secondary | ICD-10-CM

## 2017-04-02 LAB — BASIC METABOLIC PANEL
Anion gap: 7 (ref 5–15)
BUN: 14 mg/dL (ref 6–20)
CO2: 21 mmol/L — ABNORMAL LOW (ref 22–32)
Calcium: 8.8 mg/dL — ABNORMAL LOW (ref 8.9–10.3)
Chloride: 107 mmol/L (ref 101–111)
Creatinine, Ser: 1.19 mg/dL (ref 0.61–1.24)
GFR calc Af Amer: >60 mL/min (ref >60)
GFR calc non Af Amer: 55 mL/min — ABNORMAL LOW (ref >60)
Glucose, Bld: 101 mg/dL — ABNORMAL HIGH (ref 65–99)
Potassium: 3.3 mmol/L — ABNORMAL LOW (ref 3.5–5.1)
Sodium: 135 mmol/L (ref 135–145)

## 2017-04-02 LAB — MAGNESIUM: Magnesium: 1.9 mg/dL (ref 1.7–2.4)

## 2017-04-02 LAB — CBC
HCT: 32.4 % — ABNORMAL LOW (ref 39.0–52.0)
Hemoglobin: 10.9 g/dL — ABNORMAL LOW (ref 13.0–17.0)
MCH: 29.9 pg (ref 26.0–34.0)
MCHC: 33.6 g/dL (ref 30.0–36.0)
MCV: 88.8 fL (ref 78.0–100.0)
Platelets: 187 10*3/uL (ref 150–400)
RBC: 3.65 MIL/uL — ABNORMAL LOW (ref 4.22–5.81)
RDW: 13.9 % (ref 11.5–15.5)
WBC: 8 10*3/uL (ref 4.0–10.5)

## 2017-04-02 MED ORDER — LEVOFLOXACIN 750 MG PO TABS: 750.0000 mg | ORAL_TABLET | ORAL | 0 refills | Status: AC

## 2017-04-02 MED ORDER — POTASSIUM CHLORIDE CRYS ER 20 MEQ PO TBCR
40.0000 meq | EXTENDED_RELEASE_TABLET | Freq: Once | ORAL | Status: AC
  Administered 2017-04-02: 40 meq via ORAL
  Filled 2017-04-02: qty 2

## 2017-04-02 NOTE — Discharge Summary (Signed)
Physician Discharge Summary  [name]:[id number] DOB: 1932-12-08 DOA: 03/29/2017  PCP: Kristopher Glee., MD  Admit date: 03/29/2017 Discharge date: 04/02/2017  Time spent: over 30 minutes  Recommendations for Outpatient Follow-up:  1. Follow up outpatient CBC/CMP (attention to creatinine with resumption of home BP meds at discharge) 2. Ensure completion of abx regimen for bacteremia 3. Ensure follow up with urology as outpatient, pt given information for [facility] as he was interested in someplace closer 4. Follow up repeat blood cultures, pending on day of discharge 5. Follow up exertional dyspnea as outpatient, CXR with nodular opacity in L mid lung (no c/o SOB on day of discharge)   Discharge Diagnoses:  Principal Problem:   Sepsis ([place]) Active Problems:   Hypothyroidism   HLD (hyperlipidemia)   GERD   Hypokalemia   Benign essential HTN   Prostate cancer [employer] [facility] [employer])   Discharge Condition: stable  Diet recommendation: heart healthy  Filed Weights   03/29/17 0845 03/29/17 1241  Weight: 73.9 kg (163 lb) 71.8 kg (158 lb 3.2 oz)    History of present illness:  84 year old Hispanic male with Berlene Dixson past medical history significant for anxiety, hypertension, hyperlipidemia, hypothyroidism, gastroesophageal reflux disease and prostate cancer (recently diagnosed and actively follow by urology service at [facility] hospital);who presented to the emergency department with complaints of abdominal painand dysuria.   Hospital Course:  1. Sepsis due to catheter associated UTI, bacteremia- patient presented with sepsis, started on IV ceftriaxone. Sepsis physiology has resolved. Urine culture growing multiple species. Also with stenotrophomonas, suspect this was urinary source given presentation. Blood cultures one out of four bottles growing stenotrophomonas.  Discussed with ID and planning for 10 day course.  Repeat bcx at discharge. .  2. Hypothyroidism-TSH 0.800, continue synthroid    3. Prostate cancer- status post TURP, Flomax added.  Pt interested in f/u at [place], given information.  4. Hypokalemia - repleted prn  5. Hypertension- restarted home meds at d/c  6. Acute kidney injury- restarted home meds at d/c, will need f/u outpatient  7. Anxiety/insomnia- continue alprazolam 0.25 mg by mouth every 12 hours when necessary  8. Exertional dyspnea - pt raised concerned about intermittent dyspnea and wheezing, no acute findings of physical exam, will ask for CXR (notable for nodular opacity in L mid lung.  No new opacities).  Follow up as outpatient.   Procedures:  none (i.e. Studies not automatically included, echos, thoracentesis, etc; not x-rays)  Consultations:  ID over phone  Discharge Exam: Vitals:   04/01/17 2137 04/02/17 0455  BP: 140/72 125/71  Pulse: 78 67  Resp: 18 16  Temp: 97.9 F (36.6 C) 98.9 F (37.2 C)  SpO2: 98% 98%   Feeling better.  Urinating less overnight (still 5x).  Abdominal pressure, less, no longer pain.  General: No acute distress. Cardiovascular: Heart sounds show Kelvon Giannini regular rate, and rhythm. No gallops or rubs. No murmurs. No JVD. Lungs: Clear to auscultation bilaterally with good air movement. No rales, rhonchi or wheezes. Abdomen: Soft, nontender, nondistended with normal active bowel sounds. No masses. No hepatosplenomegaly. Neurological: Alert and oriented 3. Moves all extremities 4 with equal strength. Cranial nerves II through XII grossly intact. Skin: Warm and dry. No rashes or lesions. Extremities: No clubbing or cyanosis. No edema.  Psychiatric: Mood and affect are normal. Insight and judgment are appropriate.  Discharge Instructions   Discharge Instructions    Call MD for:  difficulty breathing, headache or visual disturbances   Complete by:  As directed  Call MD for:  extreme fatigue   Complete by:  As directed    Call MD for:  persistant dizziness or light-headedness   Complete by:  As directed     Call MD for:  persistant nausea and vomiting   Complete by:  As directed    Call MD for:  severe uncontrolled pain   Complete by:  As directed    Call MD for:  temperature >100.4   Complete by:  As directed    Diet - low sodium heart healthy   Complete by:  As directed    Discharge instructions   Complete by:  As directed    You were seen for [name] suspected UTI and bacteria in your blood.  We are treating your infection with levaquin.  You should take this every other day for the next 8 days (your next dose is due tomorrow).  Please follow up with your PCP within Lester Platas week.  You should have repeat labs to recheck your kidney function (and to see whether or not to continue or make adjustments to your blood pressure medicine) and they should follow up repeat blood cultures we've done here.  Please follow up with urology as well, we have provided Marsean Elkhatib phone number for you in the discharge information.  Return if you have new, worsening, or recurrent symptoms.  Please ask your primary doctor to request records from this hospitalization so they know what was done.   Increase activity slowly   Complete by:  As directed      Allergies as of 04/02/2017   No Known Allergies     Medication List    STOP taking these medications   albuterol 108 (90 Base) MCG/ACT inhaler Commonly known as:  PROVENTIL HFA;VENTOLIN HFA   ALPRAZolam 0.25 MG tablet Commonly known as:  XANAX   ciprofloxacin 500 MG tablet Commonly known as:  CIPRO   dicyclomine 10 MG capsule Commonly known as:  BENTYL   LORazepam 0.5 MG tablet Commonly known as:  ATIVAN     TAKE these medications   esomeprazole 40 MG capsule Commonly known as:  NEXIUM Take 40 mg by mouth daily.   fenofibrate 160 MG tablet Take 160 mg by mouth daily.   levofloxacin 750 MG tablet Commonly known as:  LEVAQUIN Take 1 tablet (750 mg total) by mouth every other day for 8 days. Start taking on:  04/03/2017   levothyroxine 100 MCG tablet Commonly  known as:  SYNTHROID, LEVOTHROID Take 100 mcg by mouth daily before breakfast.   valsartan-hydrochlorothiazide 320-25 MG tablet Commonly known as:  DIOVAN-HCT Take 1 tablet by mouth daily.      No Known Allergies Follow-up Information    Kristopher Glee., MD Follow up.   Specialty:  Internal Medicine Contact information: [address] [place] [address] [place] Follow up.   Why:  Call to schedule an appointment Contact information: [place] [phone]           The results of significant diagnostics from this hospitalization (including imaging, microbiology, ancillary and laboratory) are listed below for reference.    Significant Diagnostic Studies: Dg Chest 2 View  Result Date: 04/01/2017 CLINICAL DATA:  Shortness of breath with exertion EXAM: CHEST  2 VIEW COMPARISON:  Chest radiograph March 30, 2016 and chest CT Aug 26, 2016 FINDINGS: There is scarring in the left apex. Ill-defined  nodular opacity in the left mid lung is noted, better seen on CT. There is no frank edema or consolidation. Heart size and pulmonary vascularity are normal. No adenopathy. No bone lesions. IMPRESSION: Scarring left apex, stable. Nodular opacity in the left mid lung remains, better seen on recent CT. No new opacity evident. Stable cardiac silhouette. Electronically Signed   By: Lowella Grip III M.D.   On: 04/01/2017 19:21    Microbiology: Recent Results (from the past 240 hour(s))  Urine culture     Status: Abnormal   Collection Time: 03/29/17  8:21 AM  Result Value Ref Range Status   Specimen Description URINE, RANDOM  Final   Special Requests NONE  Final   Culture MULTIPLE SPECIES PRESENT, SUGGEST RECOLLECTION (Elody Kleinsasser)  Final   Report Status 03/30/2017 FINAL  Final  Blood culture (routine x 2)     Status: Abnormal   Collection Time: 03/29/17  8:54 AM  Result Value Ref Range Status    Specimen Description BLOOD RIGHT ANTECUBITAL  Final   Special Requests   Final    BOTTLES DRAWN AEROBIC AND ANAEROBIC Blood Culture adequate volume   Culture  Setup Time   Final    GRAM NEGATIVE RODS AEROBIC BOTTLE ONLY CRITICAL RESULT CALLED TO, READ BACK BY AND VERIFIED WITH: Christean Grief Pharm.D. 15:25 03/30/17 ([email or web address]) Performed at [facility], [place] [address] [place], [place] [address]    Culture STENOTROPHOMONAS MALTOPHILIA (Dejion Grillo)  Final   Report Status 04/01/2017 FINAL  Final   Organism ID, Bacteria STENOTROPHOMONAS MALTOPHILIA  Final      Susceptibility   Stenotrophomonas maltophilia - MIC*    LEVOFLOXACIN 0.5 SENSITIVE Sensitive     TRIMETH/SULFA <=20 SENSITIVE Sensitive     * STENOTROPHOMONAS MALTOPHILIA  Blood Culture ID Panel (Reflexed)     Status: None   Collection Time: 03/29/17  8:54 AM  Result Value Ref Range Status   Enterococcus species NOT DETECTED NOT DETECTED Final   Listeria monocytogenes NOT DETECTED NOT DETECTED Final   Staphylococcus species NOT DETECTED NOT DETECTED Final   Staphylococcus aureus NOT DETECTED NOT DETECTED Final   Streptococcus species NOT DETECTED NOT DETECTED Final   Streptococcus agalactiae NOT DETECTED NOT DETECTED Final   Streptococcus pneumoniae NOT DETECTED NOT DETECTED Final   Streptococcus pyogenes NOT DETECTED NOT DETECTED Final   Acinetobacter baumannii NOT DETECTED NOT DETECTED Final   Enterobacteriaceae species NOT DETECTED NOT DETECTED Final   Enterobacter cloacae complex NOT DETECTED NOT DETECTED Final   Escherichia coli NOT DETECTED NOT DETECTED Final   Klebsiella oxytoca NOT DETECTED NOT DETECTED Final   Klebsiella pneumoniae NOT DETECTED NOT DETECTED Final   Proteus species NOT DETECTED NOT DETECTED Final   Serratia marcescens NOT DETECTED NOT DETECTED Final   Haemophilus influenzae NOT DETECTED NOT DETECTED Final   Neisseria meningitidis NOT DETECTED NOT DETECTED Final   Pseudomonas aeruginosa NOT DETECTED NOT  DETECTED Final   Candida albicans NOT DETECTED NOT DETECTED Final   Candida glabrata NOT DETECTED NOT DETECTED Final   Candida krusei NOT DETECTED NOT DETECTED Final   Candida parapsilosis NOT DETECTED NOT DETECTED Final   Candida tropicalis NOT DETECTED NOT DETECTED Final    Comment: Performed at [facility], [place] [address], [place], [place] [address]  Blood culture (routine x 2)     Status: None (Preliminary result)   Collection Time: 03/29/17  8:59 AM  Result Value Ref Range Status   Specimen Description BLOOD LEFT ANTECUBITAL  Final  Special Requests   Final    BOTTLES DRAWN AEROBIC AND ANAEROBIC Blood Culture adequate volume   Culture   Final    NO GROWTH 4 DAYS Performed at [facility], [place] [address] [place], [place] [address]    Report Status PENDING  Incomplete     Labs: Basic Metabolic Panel: Recent Labs  Lab 03/29/17 0854 03/29/17 1024 03/30/17 0537 03/31/17 0934 04/01/17 0506 04/02/17 0508  NA 135  --  135 136 137 135  K 3.0*  --  3.7 3.4* 3.5 3.3*  CL 103  --  108 109 111 107  CO2 25  --  22 21* 22 21*  GLUCOSE 113*  --  95 96 90 101*  BUN 33*  --  30* 17 15 14   CREATININE 1.96*  --  1.50* 1.17 1.28* 1.19  CALCIUM 9.7  --  8.4* 8.4* 8.4* 8.8*  MG  --  2.1  --   --   --  1.9   Liver Function Tests: Recent Labs  Lab 03/29/17 0854  AST 26  ALT 23  ALKPHOS 39  BILITOT 1.6*  PROT 7.5  ALBUMIN 3.9   No results for input(s): LIPASE, AMYLASE in the last 168 hours. No results for input(s): AMMONIA in the last 168 hours. CBC: Recent Labs  Lab 03/29/17 0854 03/30/17 0537 03/31/17 0934 04/01/17 0506 04/02/17 0508  WBC 20.3* 14.4* 10.5 8.8 8.0  NEUTROABS 17.9*  --   --   --   --   HGB 14.1 11.0* 11.4* 10.6* 10.9*  HCT 40.6 32.5* 33.7* 31.0* 32.4*  MCV 90.4 91.5 90.6 89.6 88.8  PLT 191 153 162 167 187   Cardiac Enzymes: No results for input(s): CKTOTAL, CKMB, CKMBINDEX, TROPONINI in the last 168 hours. BNP: BNP (last 3 results) No  results for input(s): BNP in the last 8760 hours.  ProBNP (last 3 results) No results for input(s): PROBNP in the last 8760 hours.  CBG: No results for input(s): GLUCAP in the last 168 hours.     Signed:  Fayrene Helper MD.  [facility] 04/02/2017, 7:51 PM

## 2017-04-02 NOTE — Care Management Important Message (Signed)
Important Message  Patient Details  Name: [name] MRN: [id number] Date of Birth: 1932-06-22   Medicare Important Message Given:  Yes    Kerin Salen 04/02/2017, 11:21 [place] Message  Patient Details  Name: [name] MRN: [id number] Date of Birth: 1933-01-23   Medicare Important Message Given:  Yes    Kerin Salen 04/02/2017, 11:21 AM

## 2017-04-03 LAB — CULTURE, BLOOD (ROUTINE X 2)
Culture: NO GROWTH
Special Requests: ADEQUATE

## 2017-04-07 LAB — CULTURE, BLOOD (ROUTINE X 2)
Culture: NO GROWTH
Culture: NO GROWTH
Special Requests: ADEQUATE
Special Requests: ADEQUATE

## 2017-04-10 ENCOUNTER — Ambulatory Visit (INDEPENDENT_AMBULATORY_CARE_PROVIDER_SITE_OTHER): Payer: 59 | Admitting: Emergency Medicine

## 2017-04-10 ENCOUNTER — Encounter: Payer: Self-pay | Admitting: Emergency Medicine

## 2017-04-10 VITALS — BP 128/82 | HR 86 | Temp 98.2°F | Resp 17 | Ht 65.0 in | Wt 158.0 lb

## 2017-04-10 DIAGNOSIS — E785 Hyperlipidemia, unspecified: Secondary | ICD-10-CM | POA: Diagnosis not present

## 2017-04-10 DIAGNOSIS — E039 Hypothyroidism, unspecified: Secondary | ICD-10-CM | POA: Diagnosis not present

## 2017-04-10 DIAGNOSIS — I10 Essential (primary) hypertension: Secondary | ICD-10-CM | POA: Diagnosis not present

## 2017-04-10 DIAGNOSIS — C61 Malignant neoplasm of prostate: Secondary | ICD-10-CM

## 2017-04-10 DIAGNOSIS — G4700 Insomnia, unspecified: Secondary | ICD-10-CM | POA: Diagnosis not present

## 2017-04-10 DIAGNOSIS — Z09 Encounter for follow-up examination after completed treatment for conditions other than malignant neoplasm: Secondary | ICD-10-CM | POA: Insufficient documentation

## 2017-04-10 DIAGNOSIS — Z87440 Personal history of urinary (tract) infections: Secondary | ICD-10-CM

## 2017-04-10 MED ORDER — ALPRAZOLAM 0.5 MG PO TABS: 0.5000 mg | ORAL_TABLET | Freq: Every day | ORAL | 1 refills | Status: DC

## 2017-04-10 NOTE — Patient Instructions (Addendum)
IF you received an x-ray today, you will receive an invoice from [facility]. Please contact [facility] at [phone] with questions or concerns regarding your invoice.   IF you received labwork today, you will receive an invoice from [facility]. Please contact [facility] at [phone] with questions or concerns regarding your invoice.   Our billing staff will not be able to assist you with questions regarding bills from these companies.  You will be contacted with the lab results as soon as they are available. The fastest way to get your results is to activate your My Chart account. Instructions are located on the last page of this paperwork. If you have not heard from [place] regarding the results in 2 weeks, please contact this office.      Mantenimiento de Scientist, forensic hombres (Health Maintenance, Male) Un estilo de vida saludable y los cuidados preventivos son importantes para la salud y Musician. Pregntele al mdico cul es el cronograma de exmenes peridicos adecuado para usted. [place]? Consuma una dieta saludable  Coma muchas verduras, frutas, cereales integrales, productos lcteos con bajo contenido de grasa y Advertising account planner.  No consuma muchos alimentos de alto contenido de grasas slidas, azcares agregados o sal. Mantenga un peso saludable La actividad fsica habitual puede ayudarlo a Science writer o mantener un peso saludable. Deber hacer lo siguiente:  Realizar al menos 130minutos de actividad fsica por semana. El ejercicio debe aumentar la frecuencia cardaca y Actor la transpiracin (ejercicio de [place]).  Hacer ejercicios de entrenamiento de fuerza por lo [employer] por semana. Controlarse los niveles de colesterol y lpidos en la sangre  Hgase anlisis de sangre para controlar los lpidos y el colesterol cada 5aos a partir de los 35aos. Si tiene un riesgo alto de Best boy cardiopatas  coronarias, debe comenzar a [employer] de [place] a los [place]. Es posible que Automotive engineer los niveles de colesterol con mayor frecuencia si: ? Sus niveles de lpidos y colesterol son altos. ? Es mayor de [id number]. ? Tiene un riesgo alto de tener cardiopatas coronarias. QU DEBO SABER SOBRE LAS PRUEBAS DE [place]? Muchos tipos de cncer se pueden detectar de manera temprana y a menudo prevenirse. Cncer de pulmn  Debe someterse a pruebas de deteccin de cncer de pulmn todos los aos en los siguientes casos: ? Si fuma actualmente y lo ha hecho durante por lo menos 30aos. ? Si fue fumador que dej el hbito en el trmino de los ltimos 15aos.  Hable con el mdico sobre las opciones en relacin con los estudios de deteccin, cundo debe comenzar a Insurance underwriter y con Armed forces operational officer. Cncer colorrectal  Generalmente, las pruebas de deteccin habituales del cncer colorrectal comienzan a los 50aos y deben repetirse cada 5 a 10aos hasta los 75aos. Es posible que tenga que hacerse las pruebas con mayor frecuencia si se detectan formas tempranas de plipos precancerosos o pequeos bultos. Sin embargo, el mdico podr aconsejarle que lo haga antes, si tiene factores de riesgo para el cncer de colon.  El mdico puede recomendarle que use kits de prueba caseros para Publishing rights manager oculta en la materia fecal.  Se puede usar una pequea cmara en el extremo de un tubo para examinar el colon (sigmoidoscopia o colonoscopia). Este estudio [employer] formas ms tempranas de Surveyor, minerals. Cncer de prstata y de testculo  En funcin de la edad y del estado de salud general, el mdico  puede realizarle determinados estudios de deteccin del cncer de prstata y de testculo.  Hable con el mdico sobre cualquier sntoma o acerca de las inquietudes que tenga sobre el cncer de prstata o de testculo. Cncer de piel  Revise la piel de la cabeza a los pies con  regularidad.  Informe al mdico si aparecen nuevos lunares o si nota cambios en los que ya tiene, especialmente en estos casos: ? Si hay un cambio en el tamao, la forma o el color del lunar. ? Si tiene un lunar que es ms grande que el tamao de una goma de Games developer.  Siempre use pantalla solar. Aplquese pantalla solar de Lanell Matar y repetida a lo largo del Training and development officer.  Use mangas y [employer], un sombrero de ala ancha y gafas para el sol cuando est al Alexis Goodell, para protegerse. QU DEBO SABER SOBRE LAS CARDIOPATAS CORONARIAS, LA DIABETES Y LA HIPERTENSIN ARTERIAL?  Si usted tiene entre 18 y 7aos, debe medirse la presin arterial cada 3a 5aos. Si usted tiene 40aos o ms, debe medirse la presin arterial [employer]lett[employer]. Debe medirse la presin arterial dos veces: una vez cuando est en un hospital o una clnica y la otra vez cuando est en otro sitio. Registre el promedio de [employer] Stores. Para controlar su presin arterial cuando no est en un hospital o [place], puede usar lo siguiente: ? Jorje Guild automtica para medir la presin arterial en una farmacia. ? Un monitor para medir la presin arterial en el hogar.  Hable con el mdico [employer] American ideales de la presin arterial.  Si tiene entre 39 y 79aos, consltele al mdico si debe tomar aspirina para evitar las cardiopatas coronarias.  Hgase anlisis habituales de deteccin de la diabetes; para ello, contrlese la glucemia en ayunas. ? Si su peso es normal y tiene un bajo riesgo de padecer diabetes, realcese este anlisis cada tres aos despus de los 45aos. ? Si tiene sobrepeso y un alto riesgo de padecer diabetes, considere someterse a este anlisis antes o con mayor frecuencia.  Para los hombres que tienen entre 70 y 64aos, y son o han sido fumadores, se recomienda un nico estudio con ecografa para Hydrographic surveyor un aneurisma de aorta abdominal (AAA). QU DEBO SABER SOBRE LA PREVENCIN DE LAS  INFECCIONES? HepatitisB Si tiene un riesgo ms alto de Museum/gallery curator hepatitis B, debe someterse a un examen de deteccin de este virus. Hable con el mdico para determinar si corre riesgo de tener una infeccin por hepatitisB. Hepatitis C Se recomienda un anlisis de [place] para:  Todos los que nacieron entre 1945 y [id number].  Todas las personas que tengan un riesgo de haber contrado hepatitis C. Enfermedades de transmisin sexual (ETS)  Debe realizarse pruebas de Programme researcher, broadcasting/film/video de las ETS todos los aos, incluidas la gonorrea y la clamidia, en estos casos: ? Es sexualmente activo y es menor de [place]. ? Es mayor de 24aos, y Investment banker, operational informa que corre riesgo de tener este tipo de infecciones. ? La actividad sexual ha cambiado desde que le hicieron la ltima prueba de deteccin y tiene un riesgo mayor de Best boy clamidia o Radio broadcast assistant. Pregntele al mdico si usted tiene riesgo.  Consulte a su mdico para saber si tiene un alto riesgo de infectarse por el VIH. El mdico puede recomendarle un medicamento de venta con receta para ayudar a evitar la infeccin por el VIH. QU MS PUEDO HACER?  Realcese los estudios de rutina de la salud, dentales  y de la vista.  [place] (inmunizaciones).  No consuma ningn producto que contenga tabaco, lo que incluye cigarrillos, tabaco de Higher education careers adviser y Psychologist, sport and exercise. Si necesita ayuda para dejar de fumar, consulte al mdico.  Limite el consumo de alcohol a no ms de 70medidas por da. [employer] Corporation a 12 onzas de cerveza, 5onzas de vino o 1onzas de bebidas alcohlicas de alta graduacin.  No consuma drogas.  No comparta agujas.  Solicite ayuda a su mdico si necesita apoyo o informacin para abandonar las drogas.  Informe a su mdico si a menudo se siente deprimido.  Notifique a su mdico si alguna vez ha sido vctima de abuso o si no se siente seguro en su hogar. Esta informacin no tiene Marine scientist el  consejo del mdico. Asegrese de hacerle al mdico cualquier pregunta que tenga. Document Released: 09/13/2007 Document Revised: 04/07/2014 Document Reviewed: 12/19/2014 Elsevier Interactive Patient Education  [employer].

## 2017-04-10 NOTE — Progress Notes (Addendum)
[address] 84 y.o.   Chief Complaint  Patient presents with  . Establish Care    HISTORY OF PRESENT ILLNESS: This is a 84 y.o. male here today to establish care. Recently in the Hospital with urosepsis; just finished course of Levaquin; s/p partial prostatectomy 03/14/17. Much better; has no complaints.  HPI   Prior to Admission medications   Medication Sig Start Date End Date Taking? Authorizing Provider  esomeprazole (NEXIUM) 40 MG capsule Take 40 mg by mouth daily. 07/28/16  Yes [provider]  fenofibrate 160 MG tablet Take 160 mg by mouth daily.   Yes [provider]  levothyroxine (SYNTHROID, LEVOTHROID) 100 MCG tablet Take 100 mcg by mouth daily before breakfast.   Yes [provider]  valsartan-hydrochlorothiazide (DIOVAN-HCT) 320-25 MG tablet Take 1 tablet by mouth daily. 07/17/16  Yes [provider]  levofloxacin (LEVAQUIN) 750 MG tablet Take 1 tablet (750 mg total) by mouth every other day for 8 days. Patient not taking: Reported on 04/10/2017 04/03/17 04/11/17  Elodia Florence., MD    No Known Allergies  Patient Active Problem List   Diagnosis Date Noted  . Sepsis ([place]) 03/29/2017  . Hypokalemia 03/29/2017  . Benign essential HTN 03/29/2017  . Prostate cancer ([place]) 03/29/2017  . UNSPECIFIED HEARING LOSS 04/30/2010  . HERNIATED LUMBOSACRAL DISC 02/29/2008  . HYPERTROPHY PROSTATE W/O UR OBST & OTH LUTS 01/26/2008  . NEVUS 03/01/2007  . Hypothyroidism 12/16/2006  . HYPOGONADISM 12/16/2006  . HLD (hyperlipidemia) 12/16/2006  . OBSTRUCTIVE SLEEP APNEA 12/16/2006  . GERD 12/16/2006    Past Medical History:  Diagnosis Date  . Anxiety   . Benign pigmented nevus   . Cataract    LEFT EYE  . Depression   . Disc degeneration, lumbar   . Epigastric rebound abdominal tenderness   . Esophageal reflux   . Headache(784.0)   . Hearing loss   . Hemorrhoids   . Hiatal hernia   . Hyperlipidemia   . Hypothyroidism   . Insomnia    . OSA (obstructive sleep apnea)   . Osteoarthritis   . Reflux     Past Surgical History:  Procedure Laterality Date  . BACK SURGERY     L4-L5  . 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 CHOLECYSTECTOMY    . CORNEAL TRANSPLANT     X3 TRANSPLANT AT [facility] CATARACT SURGERY COMPLICATIONS  . EYE SURGERY      Social History   Socioeconomic History  . Marital status: Married    Spouse name: Not on file  . Number of children: Not on file  . Years of education: Not on file  . Highest education level: Not on file  Social Needs  . Financial resource strain: Not on file  . Food insecurity - worry: Not on file  . Food insecurity - inability: Not on file  . Transportation needs - medical: Not on file  . Transportation needs - non-medical: Not on file  Occupational History  . Occupation: retired    Comment: Therapist, sports in [place]  Tobacco Use  . Smoking status: Never Smoker  . Smokeless tobacco: Never Used  Substance and Sexual Activity  . Alcohol use: No  . Drug use: No  . Sexual activity: No  Other Topics Concern  . Not on file  Social History Narrative  . Not on file    Family History  Problem Relation Age of Onset  .  Cancer Brother   . Brain cancer Sister      Review of Systems  Constitutional: Negative.  Negative for chills, fever and weight loss.  HENT: Positive for hearing loss (chronic). Negative for ear pain, nosebleeds, sinus pain and sore throat.   Eyes: Negative.  Negative for blurred vision, double vision, discharge and redness.  Respiratory: Negative.  Negative for cough, hemoptysis, shortness of breath and wheezing.   Cardiovascular: Negative.  Negative for chest pain, palpitations and leg swelling.  Gastrointestinal: Negative.  Negative for abdominal pain, blood in stool, diarrhea, nausea and vomiting.  Genitourinary: Positive for frequency. Negative for dysuria and hematuria.  Musculoskeletal: Negative.   Negative for back pain, myalgias and neck pain.  Skin: Negative.  Negative for rash.  Neurological: Negative.  Negative for dizziness and headaches.  Endo/Heme/Allergies: Negative.   All other systems reviewed and are negative.   Vitals:   04/10/17 0952  BP: 128/82  Pulse: 86  Resp: 17  Temp: 98.2 F (36.8 C)  SpO2: 98%    Physical Exam  Constitutional: He is oriented to person, place, and time. He appears well-developed and well-nourished.  HENT:  Head: Normocephalic and atraumatic.  Right Ear: External ear normal.  Left Ear: External ear normal.  Nose: Nose normal.  Mouth/Throat: Oropharynx is clear and moist.  Eyes: Conjunctivae and EOM are normal.  Right pupil dilated and non-reactive  Neck: Normal range of motion. Neck supple. No JVD present. No thyromegaly present.  Cardiovascular: Normal rate, regular rhythm, normal heart sounds and intact distal pulses.  Pulmonary/Chest: Effort normal and breath sounds normal.  Abdominal: Soft. Bowel sounds are normal. He exhibits no distension. There is no tenderness. There is no guarding.  Musculoskeletal: Normal range of motion.  +mallet finger index finger; splint in place.  Lymphadenopathy:    He has no cervical adenopathy.  Neurological: He is alert and oriented to person, place, and time. No sensory deficit. He exhibits normal muscle tone.  Skin: Skin is warm and dry. Capillary refill takes less than 2 seconds. No rash noted.  Psychiatric: He has a normal mood and affect. His behavior is normal.  Vitals reviewed.  A total of 30 minutes was spent in the room with the patient, greater than 50% of which was in counseling/coordination of care.   ASSESSMENT & PLAN:  [name] was seen today for establish care.  Diagnoses and all orders for this visit:  Recent urinary tract infection -     Basic metabolic panel -     CBC with Differential/Platelet  Hospital discharge follow-up -     Basic metabolic panel -     CBC with  Differential/Platelet  Insomnia, unspecified type -     Discontinue: ALPRAZolam (XANAX) 0.5 MG tablet; Take 1 tablet (0.5 mg total) by mouth at bedtime. Take as needed only. -     ALPRAZolam (XANAX) 0.5 MG tablet; Take 1 tablet (0.5 mg total) by mouth at bedtime. Take as needed only.  Benign essential HTN  Prostate cancer ([place])  Hyperlipidemia, unspecified hyperlipidemia type  Hypothyroidism, unspecified type    Patient Instructions       IF you received an x-ray today, you will receive an invoice from [facility]. Please contact [facility] at [phone] with questions or concerns regarding your invoice.   IF you received labwork today, you will receive an invoice from [facility]. Please contact [facility] at [phone] with questions or concerns regarding your invoice.   Our billing staff will not be able to  assist you with questions regarding bills from these companies.  You will be contacted with the lab results as soon as they are available. The fastest way to get your results is to activate your My Chart account. Instructions are located on the last page of this paperwork. If you have not heard from [place] regarding the results in 2 weeks, please contact this office.      Mantenimiento de Scientist, forensic hombres (Health Maintenance, Male) Un estilo de vida saludable y los cuidados preventivos son importantes para la salud y Musician. Pregntele al mdico cul es el cronograma de exmenes peridicos adecuado para usted. [place]? Consuma una dieta saludable  Coma muchas verduras, frutas, cereales integrales, productos lcteos con bajo contenido de grasa y Advertising account planner.  No consuma muchos alimentos de alto contenido de grasas slidas, azcares agregados o sal. Mantenga un peso saludable La actividad fsica habitual puede ayudarlo a Science writer o mantener un peso saludable. Deber hacer lo siguiente:  Realizar al menos  184minutos de actividad fsica por semana. El ejercicio debe aumentar la frecuencia cardaca y Actor la transpiracin (ejercicio de [place]).  Hacer ejercicios de entrenamiento de fuerza por lo [employer] por semana. Controlarse los niveles de colesterol y lpidos en la sangre  Hgase anlisis de sangre para controlar los lpidos y el colesterol cada 5aos a partir de los 35aos. Si tiene un riesgo alto de Best boy cardiopatas coronarias, debe comenzar a [employer] de [place] a los [place]. Es posible que Automotive engineer los niveles de colesterol con mayor frecuencia si: ? Sus niveles de lpidos y colesterol son altos. ? Es mayor de 54UJW. ? Tiene un riesgo alto de tener cardiopatas coronarias. QU DEBO SABER SOBRE LAS PRUEBAS DE [place]? Muchos tipos de cncer se pueden detectar de manera temprana y a menudo prevenirse. Cncer de pulmn  Debe someterse a pruebas de deteccin de cncer de pulmn todos los aos en los siguientes casos: ? Si fuma actualmente y lo ha hecho durante por lo menos 30aos. ? Si fue fumador que dej el hbito en el trmino de los ltimos 15aos.  Hable con el mdico sobre las opciones en relacin con los estudios de deteccin, cundo debe comenzar a Insurance underwriter y con Armed forces operational officer. Cncer colorrectal  Generalmente, las pruebas de deteccin habituales del cncer colorrectal comienzan a los 50aos y deben repetirse cada 5 a 10aos hasta los 75aos. Es posible que tenga que hacerse las pruebas con mayor frecuencia si se detectan formas tempranas de plipos precancerosos o pequeos bultos. Sin embargo, el mdico podr aconsejarle que lo haga antes, si tiene factores de riesgo para el cncer de colon.  El mdico puede recomendarle que use kits de prueba caseros para Publishing rights manager oculta en la materia fecal.  Se puede usar una pequea cmara en el extremo de un tubo para examinar el colon (sigmoidoscopia o colonoscopia).  Este estudio [employer] formas ms tempranas de Surveyor, minerals. Cncer de prstata y de testculo  En funcin de la edad y del [place] de salud general, el mdico puede realizarle determinados estudios de deteccin del cncer de prstata y de testculo.  Hable con el mdico sobre cualquier sntoma o acerca de las inquietudes que tenga sobre el cncer de prstata o de testculo. Cncer de piel  Revise la piel de la cabeza a los pies con regularidad.  Informe al mdico si aparecen nuevos lunares o si nota cambios en los  que ya tiene, especialmente en estos casos: ? Si hay un cambio en el tamao, la forma o el color del lunar. ? Si tiene un lunar que es ms grande que el tamao de una goma de Games developer.  Siempre use pantalla solar. Aplquese pantalla solar de Lanell Matar y repetida a lo largo del Training and development officer.  Use mangas y [employer], un sombrero de ala ancha y gafas para el sol cuando est al Alexis Goodell, para protegerse. QU DEBO SABER SOBRE LAS CARDIOPATAS CORONARIAS, LA DIABETES Y LA HIPERTENSIN ARTERIAL?  Si usted tiene entre 18 y 45aos, debe medirse la presin arterial cada 3a 5aos. Si usted tiene 40aos o ms, debe medirse la presin arterial [employer]. Debe medirse la presin arterial dos veces: una vez cuando est en un hospital o una clnica y la otra vez cuando est en otro sitio. Registre el promedio de [employer]. Para controlar su presin arterial cuando no est en un hospital o [place], puede usar lo siguiente: ? Jorje Guild automtica para medir la presin arterial en una farmacia. ? Un monitor para medir la presin arterial en el hogar.  Hable con el mdico [employer] ideales de la presin arterial.  Si tiene entre 11 y 79aos, consltele al mdico si debe tomar aspirina para evitar las cardiopatas coronarias.  Hgase anlisis habituales de deteccin de la diabetes; para ello, contrlese la glucemia en ayunas. ? Si su peso es normal y  tiene un bajo riesgo de padecer diabetes, realcese este anlisis cada tres aos despus de los 45aos. ? Si tiene sobrepeso y un alto riesgo de padecer diabetes, considere someterse a este anlisis antes o con mayor frecuencia.  Para los hombres que tienen entre 10 y 73aos, y son o han sido fumadores, se recomienda un nico estudio con ecografa para Hydrographic surveyor un aneurisma de aorta abdominal (AAA). QU DEBO SABER SOBRE LA PREVENCIN DE LAS INFECCIONES? HepatitisB Si tiene un riesgo ms alto de Museum/gallery curator hepatitis B, debe someterse a un examen de deteccin de este virus. Hable con el mdico para determinar si corre riesgo de tener una infeccin por hepatitisB. Hepatitis C Se recomienda un anlisis de [place] para:  Todos los que nacieron entre 1945 y [phone].  Todas las personas que tengan un riesgo de haber contrado hepatitis C. Enfermedades de transmisin sexual (ETS)  Debe realizarse pruebas de Programme researcher, broadcasting/film/video de las ETS todos los aos, incluidas la gonorrea y la clamidia, en estos casos: ? Es sexualmente activo y es menor de [place]. ? Es mayor de 24aos, y Investment banker, operational informa que corre riesgo de tener este tipo de infecciones. ? La actividad sexual ha cambiado desde que le hicieron la ltima prueba de deteccin y tiene un riesgo mayor de Best boy clamidia o Radio broadcast assistant. Pregntele al mdico si usted tiene riesgo.  Consulte a su mdico para saber si tiene un alto riesgo de infectarse por el VIH. El mdico puede recomendarle un medicamento de venta con receta para ayudar a evitar la infeccin por el VIH. QU MS PUEDO HACER?  Realcese los estudios de rutina de la salud, dentales y de Public librarian.  [place] (inmunizaciones).  No consuma ningn producto que contenga tabaco, lo que incluye cigarrillos, tabaco de Higher education careers adviser y Psychologist, sport and exercise. Si necesita ayuda para dejar de fumar, consulte al mdico.  Limite el consumo de alcohol a no ms de 59medidas por da. [employer] a 12 onzas de cerveza, Public house manager de vino o  1onzas de bebidas alcohlicas de alta graduacin.  No consuma drogas.  No comparta agujas.  Solicite ayuda a su mdico si necesita apoyo o informacin para abandonar las drogas.  Informe a su mdico si a menudo se siente deprimido.  Notifique a su mdico si alguna vez ha sido vctima de abuso o si no se siente seguro en su hogar. Esta informacin no tiene Marine scientist el consejo del mdico. Asegrese de hacerle al mdico cualquier pregunta que tenga. Document Released: 09/13/2007 Document Revised: 04/07/2014 Document Reviewed: 12/19/2014 Elsevier Interactive Patient Education  2018 Elsevier Inc.     Agustina Caroli, MD [facility]

## 2017-04-11 LAB — CBC WITH DIFFERENTIAL/PLATELET
Basophils Absolute: 0 10*3/uL (ref 0.0–0.2)
Basos: 0 %
EOS (ABSOLUTE): 0.1 10*3/uL (ref 0.0–0.4)
Eos: 2 %
Hematocrit: 39.6 % (ref 37.5–51.0)
Hemoglobin: 13.3 g/dL (ref 13.0–17.7)
Immature Grans (Abs): 0 10*3/uL (ref 0.0–0.1)
Immature Granulocytes: 0 %
Lymphocytes Absolute: 1.2 10*3/uL (ref 0.7–3.1)
Lymphs: 22 %
MCH: 30.4 pg (ref 26.6–33.0)
MCHC: 33.6 g/dL (ref 31.5–35.7)
MCV: 91 fL (ref 79–97)
Monocytes Absolute: 0.4 10*3/uL (ref 0.1–0.9)
Monocytes: 7 %
Neutrophils Absolute: 3.9 10*3/uL (ref 1.4–7.0)
Neutrophils: 69 %
Platelets: 360 10*3/uL (ref 150–379)
RBC: 4.37 x10E6/uL (ref 4.14–5.80)
RDW: 14 % (ref 12.3–15.4)
WBC: 5.6 10*3/uL (ref 3.4–10.8)

## 2017-04-11 LAB — BASIC METABOLIC PANEL
BUN/Creatinine Ratio: 14 (ref 10–24)
BUN: 17 mg/dL (ref 8–27)
CO2: 18 mmol/L — ABNORMAL LOW (ref 20–29)
Calcium: 9.8 mg/dL (ref 8.6–10.2)
Chloride: 104 mmol/L (ref 96–106)
Creatinine, Ser: 1.18 mg/dL (ref 0.76–1.27)
GFR calc Af Amer: 66 mL/min/{1.73_m2} (ref >59)
GFR calc non Af Amer: 57 mL/min/{1.73_m2} — ABNORMAL LOW (ref >59)
Glucose: 84 mg/dL (ref 65–99)
Potassium: 4.2 mmol/L (ref 3.5–5.2)
Sodium: 139 mmol/L (ref 134–144)

## 2017-04-14 ENCOUNTER — Encounter: Payer: Self-pay | Admitting: Radiology

## 2017-05-01 ENCOUNTER — Ambulatory Visit (INDEPENDENT_AMBULATORY_CARE_PROVIDER_SITE_OTHER): Payer: 59 | Admitting: Emergency Medicine

## 2017-05-01 ENCOUNTER — Other Ambulatory Visit: Payer: Self-pay

## 2017-05-01 ENCOUNTER — Encounter: Payer: Self-pay | Admitting: Emergency Medicine

## 2017-05-01 VITALS — BP 142/72 | HR 76 | Temp 97.7°F | Resp 16 | Ht 65.0 in | Wt 158.8 lb

## 2017-05-01 DIAGNOSIS — I10 Essential (primary) hypertension: Secondary | ICD-10-CM | POA: Diagnosis not present

## 2017-05-01 DIAGNOSIS — N183 Chronic kidney disease, stage 3 unspecified: Secondary | ICD-10-CM | POA: Insufficient documentation

## 2017-05-01 MED ORDER — AMLODIPINE BESYLATE 5 MG PO TABS: 5.0000 mg | ORAL_TABLET | Freq: Every day | ORAL | 3 refills | Status: DC

## 2017-05-01 NOTE — Patient Instructions (Addendum)
   IF you received an x-ray today, you will receive an invoice from [facility]. Please contact  Radiology at [phone] with questions or concerns regarding your invoice.   IF you received labwork today, you will receive an invoice from [facility]. Please contact [facility] at [phone] with questions or concerns regarding your invoice.   Our billing staff will not be able to assist you with questions regarding bills from these companies.  You will be contacted with the lab results as soon as they are available. The fastest way to get your results is to activate your My Chart account. Instructions are located on the last page of this paperwork. If you have not heard from us regarding the results in 2 weeks, please contact this office.     Hipertensin Hypertension El trmino hipertensin es otra forma de denominar a la presin arterial elevada. La presin arterial elevada fuerza al corazn a trabajar ms para bombear la sangre. Esto puede causar problemas con el paso del tiempo. Una lectura de presin arterial est compuesta por 2 nmeros. Hay un nmero superior (sistlico) sobre un nmero inferior (diastlico). Lo ideal es tener la presin arterial por debajo de 120/80. Las decisiones saludables pueden ayudarle a disminuir su presin arterial. Es posible que necesite medicamentos que le ayuden a disminuir su presin arterial si:  Su presin arterial no disminuye mediante decisiones saludables.  Su presin arterial est por encima de 130/80.  Siga estas instrucciones en su casa: Comida y bebida  Si se lo indican, siga el plan de alimentacin de DASH (Dietary Approaches to Stop Hypertension, Maneras de alimentarse para detener la hipertensin). Esta dieta incluye: ? Que la mitad del plato de cada comida sea de frutas y verduras. ? Que un cuarto del plato de cada comida sea de cereales integrales. Los cereales integrales incluyen pasta integral, arroz integral y pan  integral. ? Comer y beber productos lcteos con bajo contenido de grasa, como leche descremada o yogur bajo en grasas. ? Que un cuarto del plato de cada comida sea de protenas bajas en grasa (magras). Las protenas bajas en grasa incluyen pescado, pollo sin piel, huevos, frijoles y tofu. ? Evitar consumir carne grasa, carne curada y procesada, o pollo con piel. ? Evitar consumir alimentos prehechos o procesados.  Consuma menos de 1500 mg de sal (sodio) por da.  Limite el consumo de alcohol a no ms de 1 medida por da si es mujer y no est embarazada y a 2 medidas por da si es hombre. Una medida equivale a 12onzas de cerveza, 5onzas de vino o 1onzas de bebidas alcohlicas de alta graduacin. Estilo de vida  Trabaje con su mdico para mantenerse en un peso saludable o para perder peso. Pregntele a su mdico cul es el peso recomendable para usted.  Realice al menos 30 minutos de ejercicio que haga que se acelere su corazn (ejercicio aerbico) la mayora de los das de la semana. Estos pueden incluir caminar, nadar o andar en bicicleta.  Realice al menos 30 minutos de ejercicio que fortalezca sus msculos (ejercicios de resistencia) al menos 3 das a la semana. Estos pueden incluir levantar pesas o hacer pilates.  No consuma ningn producto que contenga nicotina o tabaco. Esto incluye cigarrillos y cigarrillos electrnicos. Si necesita ayuda para dejar de fumar, consulte al mdico.  Controle su presin arterial en su casa tal como le indic el mdico.  Concurra a todas las visitas de control como se lo haya indicado el mdico. Esto es   importante. Medicamentos  Tome los medicamentos de venta libre y los recetados solamente como se lo haya indicado el mdico. Siga cuidadosamente las indicaciones.  No omita las dosis de medicamentos para la presin arterial. Los medicamentos pierden eficacia si omite dosis. El hecho de omitir las dosis tambin aumenta el riesgo de otros  problemas.  Pregntele a su mdico a qu efectos secundarios o reacciones a los medicamentos debe prestar atencin. Comunquese con un mdico si:  Piensa que tiene una reaccin a los medicamentos que est tomando.  Tiene dolores de cabeza frecuentes (recurrentes).  Siente mareos.  Tiene hinchazn en los tobillos.  Tiene problemas de visin. Solicite ayuda de inmediato si:  Siente un dolor de cabeza muy intenso.  Comienza a sentirse confundido.  Se siente dbil o adormecido.  Siente que va a desmayarse.  Siente un dolor muy intenso en: ? El pecho. ? El vientre (abdomen).  Devuelve (vomita) ms de una vez.  Tiene dificultad para respirar. Resumen  El trmino hipertensin es otra forma de denominar a la presin arterial elevada.  Las decisiones saludables pueden ayudarle a disminuir su presin arterial. Si no puede controlar su presin arterial mediante decisiones saludables, es posible que deba tomar medicamentos. Esta informacin no tiene como fin reemplazar el consejo del mdico. Asegrese de hacerle al mdico cualquier pregunta que tenga. Document Released: 09/04/2009 Document Revised: 02/27/2016 Document Reviewed: 02/27/2016 Elsevier Interactive Patient Education  2018 Elsevier Inc.  

## 2017-05-01 NOTE — Progress Notes (Signed)
[address] 84 y.o.   Chief Complaint  Patient presents with  . Hypertension    PER PATIENT BLOOD PRESSURE HIGH    HISTORY OF PRESENT ILLNESS: This is a 84 y.o. male complaining of high blood pressure.  Systolics from 016-010.  Diastolic from 93-235.  Recently taken off losartan because of kidney issues.  No other complaints at this time.  HPI   Prior to Admission medications   Medication Sig Start Date End Date Taking? Authorizing Provider  ALPRAZolam Duanne Moron) 0.5 MG tablet Take 1 tablet (0.5 mg total) by mouth at bedtime. Take as needed only. 04/10/17  Yes Ridgely Anastacio, Ines Bloomer, MD  esomeprazole (NEXIUM) 40 MG capsule Take 40 mg by mouth daily. 07/28/16  Yes [provider]  fenofibrate 160 MG tablet Take 160 mg by mouth daily.   Yes [provider]  levothyroxine (SYNTHROID, LEVOTHROID) 100 MCG tablet Take 100 mcg by mouth daily before breakfast.   Yes [provider]  amLODipine (NORVASC) 5 MG tablet Take 1 tablet (5 mg total) by mouth daily. 05/01/17   Horald Pollen, MD  valsartan-hydrochlorothiazide (DIOVAN-HCT) 320-25 MG tablet Take 1 tablet by mouth daily. 07/17/16   [provider]    No Known Allergies  Patient Active Problem List   Diagnosis Date Noted  . Recent urinary tract infection 04/10/2017  . Hospital discharge follow-up 04/10/2017  . Insomnia 04/10/2017  . Sepsis ([place]) 03/29/2017  . Hypokalemia 03/29/2017  . Benign essential HTN 03/29/2017  . Prostate cancer ([place]) 03/29/2017  . UNSPECIFIED HEARING LOSS 04/30/2010  . HERNIATED LUMBOSACRAL DISC 02/29/2008  . HYPERTROPHY PROSTATE W/O UR OBST & OTH LUTS 01/26/2008  . NEVUS 03/01/2007  . Hypothyroidism 12/16/2006  . HYPOGONADISM 12/16/2006  . Hyperlipidemia 12/16/2006  . OBSTRUCTIVE SLEEP APNEA 12/16/2006  . GERD 12/16/2006    Past Medical History:  Diagnosis Date  . Anxiety   . Benign pigmented nevus   . Cataract    LEFT EYE  . Depression   . Disc  degeneration, lumbar   . Epigastric rebound abdominal tenderness   . Esophageal reflux   . Headache(784.0)   . Hearing loss   . Hemorrhoids   . Hiatal hernia   . Hyperlipidemia   . Hypothyroidism   . Insomnia   . OSA (obstructive sleep apnea)   . Osteoarthritis   . Reflux     Past Surgical History:  Procedure Laterality Date  . BACK SURGERY     L4-L5  . 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 CHOLECYSTECTOMY    . CORNEAL TRANSPLANT     X3 TRANSPLANT AT [facility] CATARACT SURGERY COMPLICATIONS  . EYE SURGERY      Social History   Socioeconomic History  . Marital status: Married    Spouse name: Not on file  . Number of children: Not on file  . Years of education: Not on file  . Highest education level: Not on file  Social Needs  . Financial resource strain: Not on file  . Food insecurity - worry: Not on file  . Food insecurity - inability: Not on file  . Transportation needs - medical: Not on file  . Transportation needs - non-medical: Not on file  Occupational History  . Occupation: retired    Comment: Therapist, sports in [place]  Tobacco Use  . Smoking status: Never Smoker  . Smokeless tobacco: Never Used  Substance and Sexual Activity  .  Alcohol use: No  . Drug use: No  . Sexual activity: No  Other Topics Concern  . Not on file  Social History Narrative  . Not on file    Family History  Problem Relation Age of Onset  . Cancer Brother   . Brain cancer Sister      Review of Systems  Constitutional: Negative.  Negative for chills, fever and malaise/fatigue.  HENT: Negative for congestion and sore throat.   Respiratory: Negative for cough and shortness of breath.   Cardiovascular: Negative for chest pain and palpitations.  Gastrointestinal: Negative for abdominal pain, heartburn, nausea and vomiting.  Genitourinary: Negative for dysuria and hematuria.  Musculoskeletal: Positive for back pain and joint pain.    Skin: Negative.  Negative for rash.  Neurological: Negative.  Negative for dizziness and headaches.  Endo/Heme/Allergies: Negative.   All other systems reviewed and are negative.    Vitals:   05/01/17 0851 05/01/17 0856  BP: 140/66 (!) 142/72  Pulse: 76   Resp: 16   Temp: 97.7 F (36.5 C)   SpO2: 97%      Physical Exam  Constitutional: He is oriented to person, place, and time. He appears well-developed and well-nourished.  HENT:  Head: Normocephalic and atraumatic.  Mouth/Throat: Oropharynx is clear and moist.  Eyes: Conjunctivae are normal.  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  Neck: Normal range of motion. Neck supple.  Cardiovascular: Normal rate, regular rhythm and normal heart sounds.  Pulmonary/Chest: Effort normal and breath sounds normal.  Abdominal: Soft. There is no tenderness.  Musculoskeletal: Normal range of motion.  Neurological: He is alert and oriented to person, place, and time. No sensory deficit. He exhibits normal muscle tone.  Skin: Skin is warm and dry. Capillary refill takes less than 2 seconds.  Psychiatric: He has a normal mood and affect. His behavior is normal.  Vitals reviewed.   Benign essential HTN We will start amlodipine 5 mg daily.  Lab results reviewed with patient.  A total of 25 minutes was spent in the room with the patient, greater than 50% of which was in counseling/coordination of care.  ASSESSMENT & PLAN: [name] was seen today for hypertension.  Diagnoses and all orders for this visit:  Benign essential HTN -     amLODipine (NORVASC) 5 MG tablet; Take 1 tablet (5 mg total) by mouth daily.  CKD (chronic kidney disease) stage 3, GFR 30-59 ml/min [facility])    Patient Instructions       IF you received an x-ray today, you will receive an invoice from [facility]. Please contact [facility] at [phone] with questions or concerns regarding your invoice.   IF you received labwork today, you will receive an invoice  from [place]. Please contact [facility] at [phone] with questions or concerns regarding your invoice.   Our billing staff will not be able to assist you with questions regarding bills from these companies.  You will be contacted with the lab results as soon as they are available. The fastest way to get your results is to activate your My Chart account. Instructions are located on the last page of this paperwork. If you have not heard from [place] regarding the results in 2 weeks, please contact this office.     Hipertensin Hypertension El trmino hipertensin es otra forma de denominar a la presin arterial elevada. La presin arterial elevada fuerza al corazn a trabajar ms para bombear la sangre. Esto puede causar problemas con el paso del [place]. Reuben Likes de  presin arterial est compuesta por 2 nmeros. Hay un nmero superior (sistlico) sobre un nmero inferior (diastlico). Lo ideal es tener la presin arterial por debajo de 120/80. Las decisiones saludables pueden ayudarle a disminuir su presin arterial. Es posible que necesite medicamentos que le ayuden a disminuir su presin arterial si:  Su presin arterial no disminuye mediante decisiones saludables.  Su presin arterial est por encima de 130/80.  Siga estas instrucciones en su casa: Comida y bebida  Si se lo indican, siga el plan de alimentacin de DASH (Dietary Approaches to Stop Hypertension, Maneras de alimentarse para detener la hipertensin). Esta dieta incluye: ? Que la mitad del plato de cada comida sea de frutas y verduras. ? Que un cuarto del plato de cada comida sea de cereales integrales. Los cereales integrales incluyen pasta integral, arroz integral y pan integral. ? Comer y beber productos lcteos con bajo contenido de [place], como leche descremada o yogur bajo en grasas. ? Que un cuarto del plato de cada comida sea de protenas bajas en grasa (magras). Las protenas bajas en grasa incluyen pescado, pollo sin  piel, huevos, frijoles y tofu. ? Evitar consumir carne grasa, carne curada y procesada, o pollo con piel. ? Evitar consumir alimentos prehechos o procesados.  Consuma menos de 1500 mg de sal (sodio) por da.  Limite el consumo de alcohol a no ms de 1 medida por da si es mujer y no est Music therapist y a 2 medidas por da si es hombre. Una medida equivale a 12onzas de cerveza, 5onzas de vino o 1onzas de bebidas alcohlicas de alta graduacin. Estilo de vida  Trabaje con su mdico para mantenerse en un peso saludable o para perder peso. Pregntele a su mdico cul es el peso recomendable para usted.  Realice al menos 30 minutos de ejercicio que haga que se acelere su corazn (ejercicio Arboriculturist) la [employer] de la [place]. Estos pueden incluir caminar, nadar o andar en bicicleta.  Realice al menos 30 minutos de ejercicio que fortalezca sus msculos (ejercicios de resistencia) al menos 3 das a la [place]. Estos pueden incluir levantar pesas o hacer pilates.  No consuma ningn producto que contenga nicotina o tabaco. Esto incluye cigarrillos y cigarrillos electrnicos. Si necesita ayuda para dejar de fumar, consulte al [employer].  Controle su presin arterial en su casa tal como le indic el mdico.  Concurra a todas las visitas de control como se lo haya indicado el mdico. Esto es importante. Medicamentos  [employer] de venta libre y los recetados solamente como se lo haya indicado el mdico. Siga cuidadosamente las indicaciones.  No omita las dosis de medicamentos para la presin arterial. Los medicamentos pierden eficacia si omite dosis. El hecho de omitir las dosis tambin [place] el riesgo de otros problemas.  Pregntele a su mdico a qu efectos secundarios o reacciones a los Careers information officer. Comunquese con un mdico si:  Piensa que tiene [place] reaccin a los medicamentos que est tomando.  Tiene dolores de cabeza frecuentes  (recurrentes).  Siente mareos.  Tiene hinchazn en los tobillos.  Tiene problemas de visin. Solicite ayuda de inmediato si:  Siente un dolor de cabeza muy intenso.  Comienza a sentirse confundido.  Se siente dbil o adormecido.  Siente que va a desmayarse.  Siente un dolor muy intenso en: ? El pecho. ? El vientre (abdomen).  Devuelve (vomita) ms de una vez.  Tiene dificultad para respirar. Resumen  El trmino hipertensin es otra forma de  denominar a la presin arterial elevada.  Las decisiones saludables pueden ayudarle a disminuir su presin arterial. Si no puede controlar su presin arterial mediante decisiones saludables, es posible que deba tomar medicamentos. Esta informacin no tiene Marine scientist el consejo del mdico. Asegrese de hacerle al mdico cualquier pregunta que tenga. Document Released: 09/04/2009 Document Revised: 02/27/2016 Document Reviewed: 02/27/2016 Elsevier Interactive Patient Education  2018 Elsevier Inc.      Agustina Caroli, MD [facility]

## 2017-05-01 NOTE — Assessment & Plan Note (Signed)
We will start amlodipine 5 mg daily.

## 2017-05-05 ENCOUNTER — Ambulatory Visit (INDEPENDENT_AMBULATORY_CARE_PROVIDER_SITE_OTHER): Payer: 59 | Admitting: Emergency Medicine

## 2017-05-05 DIAGNOSIS — Z23 Encounter for immunization: Secondary | ICD-10-CM | POA: Diagnosis not present

## 2017-05-22 ENCOUNTER — Other Ambulatory Visit: Payer: Self-pay

## 2017-05-22 ENCOUNTER — Emergency Department (HOSPITAL_COMMUNITY)
Admission: EM | Admit: 2017-05-22 | Discharge: 2017-05-23 | Disposition: A | Discharge status: Home / Self Care | Payer: 59 | Attending: Emergency Medicine | Admitting: Emergency Medicine

## 2017-05-22 ENCOUNTER — Encounter (HOSPITAL_COMMUNITY): Payer: Self-pay | Admitting: Emergency Medicine

## 2017-05-22 DIAGNOSIS — Z79899 Other long term (current) drug therapy: Secondary | ICD-10-CM | POA: Diagnosis not present

## 2017-05-22 DIAGNOSIS — E039 Hypothyroidism, unspecified: Secondary | ICD-10-CM | POA: Diagnosis not present

## 2017-05-22 DIAGNOSIS — R112 Nausea with vomiting, unspecified: Secondary | ICD-10-CM | POA: Diagnosis not present

## 2017-05-22 DIAGNOSIS — R197 Diarrhea, unspecified: Secondary | ICD-10-CM | POA: Insufficient documentation

## 2017-05-22 LAB — COMPREHENSIVE METABOLIC PANEL
ALT: 41 U/L (ref 17–63)
AST: 44 U/L — ABNORMAL HIGH (ref 15–41)
Albumin: 4.6 g/dL (ref 3.5–5.0)
Alkaline Phosphatase: 51 U/L (ref 38–126)
Anion gap: 11 (ref 5–15)
BUN: 33 mg/dL — ABNORMAL HIGH (ref 6–20)
CO2: 18 mmol/L — ABNORMAL LOW (ref 22–32)
Calcium: 9.6 mg/dL (ref 8.9–10.3)
Chloride: 113 mmol/L — ABNORMAL HIGH (ref 101–111)
Creatinine, Ser: 1.45 mg/dL — ABNORMAL HIGH (ref 0.61–1.24)
GFR calc Af Amer: 50 mL/min — ABNORMAL LOW (ref >60)
GFR calc non Af Amer: 43 mL/min — ABNORMAL LOW (ref >60)
Glucose, Bld: 102 mg/dL — ABNORMAL HIGH (ref 65–99)
Potassium: 3.9 mmol/L (ref 3.5–5.1)
Sodium: 142 mmol/L (ref 135–145)
Total Bilirubin: 0.8 mg/dL (ref 0.3–1.2)
Total Protein: 8 g/dL (ref 6.5–8.1)

## 2017-05-22 LAB — URINALYSIS, ROUTINE W REFLEX MICROSCOPIC
Bacteria, UA: NONE SEEN
Bilirubin Urine: NEGATIVE
Glucose, UA: NEGATIVE mg/dL
Ketones, ur: NEGATIVE mg/dL
Leukocytes, UA: NEGATIVE
Nitrite: NEGATIVE
Protein, ur: 100 mg/dL — AB
Specific Gravity, Urine: 1.024 (ref 1.005–1.030)
pH: 5 (ref 5.0–8.0)

## 2017-05-22 LAB — CBC
HCT: 47.1 % (ref 39.0–52.0)
Hemoglobin: 15.9 g/dL (ref 13.0–17.0)
MCH: 30.1 pg (ref 26.0–34.0)
MCHC: 33.8 g/dL (ref 30.0–36.0)
MCV: 89 fL (ref 78.0–100.0)
Platelets: 239 10*3/uL (ref 150–400)
RBC: 5.29 MIL/uL (ref 4.22–5.81)
RDW: 14.7 % (ref 11.5–15.5)
WBC: 6.3 10*3/uL (ref 4.0–10.5)

## 2017-05-22 LAB — LIPASE, BLOOD: Lipase: 44 U/L (ref 11–51)

## 2017-05-22 MED ORDER — ONDANSETRON HCL 4 MG/2ML IJ SOLN
4.0000 mg | Freq: Once | INTRAMUSCULAR | Status: AC
  Administered 2017-05-23: 4 mg via INTRAVENOUS
  Filled 2017-05-22: qty 2

## 2017-05-22 MED ORDER — SODIUM CHLORIDE 0.9 % IV BOLUS (SEPSIS)
1000.0000 mL | Freq: Once | INTRAVENOUS | Status: AC
  Administered 2017-05-23: 1000 mL via INTRAVENOUS

## 2017-05-22 MED ORDER — ONDANSETRON 4 MG PO TBDP
4.0000 mg | ORAL_TABLET | Freq: Once | ORAL | Status: AC | PRN
  Administered 2017-05-22: 4 mg via ORAL
  Filled 2017-05-22: qty 1

## 2017-05-22 MED ORDER — DIPHENOXYLATE-ATROPINE 2.5-0.025 MG PO TABS
1.0000 | ORAL_TABLET | Freq: Once | ORAL | Status: AC
  Administered 2017-05-23: 1 via ORAL
  Filled 2017-05-22: qty 1

## 2017-05-22 NOTE — ED Provider Notes (Signed)
TIME SEEN: 11:40 PM  CHIEF COMPLAINT: Nausea, vomiting, diarrhea  HPI: Patient is an 84 year old male with history of GERD, hypothyroidism, hypertension who presents to the emergency department with 3 days of nausea, vomiting and diarrhea.  Last episode of vomiting was yesterday but still feeling nauseated.  No chest pain or shortness of breath.  Feels his abdomen is "rumbling" but no abdominal pain.  Has had previous cholecystectomy.  Was admitted to the hospital in December for pyelonephritis.  No antibiotics or admission since that time.  No recent international travel.  Does have multiple family members that recently had similar symptoms.  There are symptoms improved without intervention.  Family states the reason he is here today is because he has felt very weak today.  No bloody stools or melena.  ROS: See HPI Constitutional: no fever  Eyes: no drainage  ENT: no runny nose   Cardiovascular:  no chest pain  Resp: no SOB  GI: Diarrhea and vomiting GU: no dysuria Integumentary: no rash  Allergy: no hives  Musculoskeletal: no leg swelling  Neurological: no slurred speech ROS otherwise negative  PAST MEDICAL HISTORY/PAST SURGICAL HISTORY:  Past Medical History:  Diagnosis Date  . Anxiety   . Benign pigmented nevus   . Cataract    LEFT EYE  . Depression   . Disc degeneration, lumbar   . Epigastric rebound abdominal tenderness   . Esophageal reflux   . Headache(784.0)   . Hearing loss   . Hemorrhoids   . Hiatal hernia   . Hyperlipidemia   . Hypothyroidism   . Insomnia   . OSA (obstructive sleep apnea)   . Osteoarthritis   . Reflux     MEDICATIONS:  Prior to Admission medications   Medication Sig Start Date End Date Taking? Authorizing Provider  ALPRAZolam Duanne Moron) 0.5 MG tablet Take 1 tablet (0.5 mg total) by mouth at bedtime. Take as needed only. 04/10/17   Horald Pollen, MD  amLODipine (NORVASC) 5 MG tablet Take 1 tablet (5 mg total) by mouth daily. 05/01/17    Horald Pollen, MD  esomeprazole (NEXIUM) 40 MG capsule Take 40 mg by mouth daily. 07/28/16   [provider]  fenofibrate 160 MG tablet Take 160 mg by mouth daily.    [provider]  levothyroxine (SYNTHROID, LEVOTHROID) 100 MCG tablet Take 100 mcg by mouth daily before breakfast.    [provider]  valsartan-hydrochlorothiazide (DIOVAN-HCT) 320-25 MG tablet Take 1 tablet by mouth daily. 07/17/16   [provider]    ALLERGIES:  No Known Allergies  SOCIAL HISTORY:  Social History   Tobacco Use  . Smoking status: Never Smoker  . Smokeless tobacco: Never Used  Substance Use Topics  . Alcohol use: No    FAMILY HISTORY: Family History  Problem Relation Age of Onset  . Cancer Brother   . Brain cancer Sister     EXAM: BP 113/82 (BP Location: Left Arm)   Pulse (!) 107   Temp 98.4 F (36.9 C) (Oral)   Resp 18   Ht 5\' 5"  (1.651 m)   Wt 71.7 kg (158 lb)   SpO2 98%   BMI 26.29 kg/m  CONSTITUTIONAL: Alert and oriented and responds appropriately to questions. Well-appearing; well-nourished, elderly but extremely well-appearing, afebrile, nontoxic HEAD: Normocephalic EYES: Conjunctivae clear, pupils appear equal, EOMI ENT: normal nose; moist mucous membranes NECK: Supple, no meningismus, no nuchal rigidity, no LAD  CARD: RRR; S1 and S2 appreciated; no murmurs, no clicks, no rubs,  no gallops RESP: Normal chest excursion without splinting or tachypnea; breath sounds clear and equal bilaterally; no wheezes, no rhonchi, no rales, no hypoxia or respiratory distress, speaking full sentences ABD/GI: Normal bowel sounds; non-distended; soft, non-tender, no rebound, no guarding, no peritoneal signs, no hepatosplenomegaly BACK:  The back appears normal and is non-tender to palpation, there is no CVA tenderness EXT: Normal ROM in all joints; non-tender to palpation; no edema; normal capillary refill; no cyanosis, no calf tenderness or swelling     SKIN: Normal color for age and race; warm; no rash NEURO: Moves all extremities equally PSYCH: The patient's mood and manner are appropriate. Grooming and personal hygiene are appropriate.  MEDICAL DECISION MAKING: Patient here with likely viral gastroenteritis.  Labs suggest mild dehydration with creatinine elevated at 1.45.  Normal LFTs, lipase.  No leukocytosis.  Urine shows no sign of infection today.  Abdominal exam completely benign.  Doubt appendicitis, colitis, diverticulitis, bowel obstruction.  I do not feel he needs emergent imaging of his abdomen.  Will treat symptoms with IV fluids, Zofran and give 1 dose of Lomotil here.  He has been able to provide [place] with a stool sample.  We will send this for further evaluation although I feel less likely that this is bacterial in nature.  ED PROGRESS: Patient reports feeling much better.  Able to drink without difficulty.  Stool cultures are pending.  He does not want to wait until his C. difficile results have returned.  Magnesium is normal.  Will discharge with prescriptions for Zofran, Bentyl and Imodium.  I feel he is safe for discharge home.  Again suspect viral gastroenteritis.  Recommended bland diet.  Discussed return precautions.  Abdominal exam still benign.   At this time, I do not feel there is any life-threatening condition present. I have reviewed and discussed all results (EKG, imaging, lab, urine as appropriate) and exam findings with patient/family. I have reviewed nursing notes and appropriate previous records.  I feel the patient is safe to be discharged home without further emergent workup and can continue workup as an outpatient as needed. Discussed usual and customary return precautions. Patient/family verbalize understanding and are comfortable with this plan.  Outpatient follow-up has been provided if needed. All questions have been answered.      Naureen Benton, Delice Bison, DO 05/23/17 [phone]

## 2017-05-22 NOTE — ED Triage Notes (Signed)
Patient complaining of weakness, diarrhea, and vomiting. Patient started this three days ago.

## 2017-05-23 DIAGNOSIS — R112 Nausea with vomiting, unspecified: Secondary | ICD-10-CM | POA: Diagnosis not present

## 2017-05-23 LAB — C DIFFICILE QUICK SCREEN W PCR REFLEX
C Diff antigen: NEGATIVE
C Diff interpretation: NOT DETECTED
C Diff toxin: NEGATIVE

## 2017-05-23 LAB — GASTROINTESTINAL PANEL BY PCR, STOOL (REPLACES STOOL CULTURE)

## 2017-05-23 LAB — MAGNESIUM: Magnesium: 2.3 mg/dL (ref 1.7–2.4)

## 2017-05-23 MED ORDER — LOPERAMIDE HCL 2 MG PO CAPS: 2.0000 mg | ORAL_CAPSULE | Freq: Four times a day (QID) | ORAL | 0 refills | Status: DC | PRN

## 2017-05-23 MED ORDER — ONDANSETRON 4 MG PO TBDP: 4.0000 mg | ORAL_TABLET | Freq: Four times a day (QID) | ORAL | 0 refills | Status: DC | PRN

## 2017-05-23 MED ORDER — DICYCLOMINE HCL 20 MG PO TABS: 20.0000 mg | ORAL_TABLET | Freq: Three times a day (TID) | ORAL | 0 refills | Status: DC

## 2017-06-02 ENCOUNTER — Telehealth: Payer: Self-pay | Admitting: *Deleted

## 2017-06-02 NOTE — Telephone Encounter (Signed)
Fax sent.   No recent ekg or chest xray done at primary care at [facility]

## 2017-06-22 HISTORY — PX: FINGER ARTHRODESIS: SHX5000

## 2017-07-03 NOTE — Progress Notes (Signed)
Nurse visit only.  Pt did not see provider at this visit.

## 2017-07-09 ENCOUNTER — Telehealth: Payer: Self-pay | Admitting: Emergency Medicine

## 2017-07-09 NOTE — Telephone Encounter (Signed)
Called interpreter services and they were able to leave a VM for pt to call back and reschedule his appt with Dr. Mitchel Honour on 10/30/17. When pt calls back, please reschedule him with Dr. Mitchel Honour at his convenience.

## 2017-07-10 ENCOUNTER — Ambulatory Visit: Payer: 59 | Admitting: Emergency Medicine

## 2017-07-14 ENCOUNTER — Other Ambulatory Visit: Payer: Self-pay | Admitting: Emergency Medicine

## 2017-07-14 NOTE — Telephone Encounter (Signed)
Refill of fenofibrate  Historical Provider  LOV 05/01/17  Dr. Mitchel Honour  [id number] - [place], [place] - [address]

## 2017-07-14 NOTE — Telephone Encounter (Signed)
Copied from [place] [id number]. Topic: Quick Communication - Rx Refill/Question >> Jul 14, 2017  1:09 PM Synthia Innocent wrote: Medication: fenofibrate 160 MG tablet  Has the patient contacted their pharmacy? Yes.   (Agent: If no, request that the patient contact the pharmacy for the refill.) Preferred Pharmacy (with phone number [facility] street name[facility] [address] Please be advised that RX refills may take up to 3 business days. We ask that you follow-up with your pharmacy.

## 2017-07-15 ENCOUNTER — Ambulatory Visit: Payer: 59 | Admitting: Emergency Medicine

## 2017-07-29 ENCOUNTER — Other Ambulatory Visit: Payer: Self-pay

## 2017-07-29 ENCOUNTER — Encounter: Payer: Self-pay | Admitting: Emergency Medicine

## 2017-07-29 ENCOUNTER — Ambulatory Visit (INDEPENDENT_AMBULATORY_CARE_PROVIDER_SITE_OTHER): Payer: 59 | Admitting: Emergency Medicine

## 2017-07-29 ENCOUNTER — Ambulatory Visit (INDEPENDENT_AMBULATORY_CARE_PROVIDER_SITE_OTHER): Payer: 59

## 2017-07-29 VITALS — BP 116/58 | HR 106 | Temp 98.4°F | Resp 16 | Ht 65.0 in | Wt 161.2 lb

## 2017-07-29 DIAGNOSIS — R05 Cough: Secondary | ICD-10-CM | POA: Diagnosis not present

## 2017-07-29 DIAGNOSIS — R0781 Pleurodynia: Secondary | ICD-10-CM

## 2017-07-29 DIAGNOSIS — I10 Essential (primary) hypertension: Secondary | ICD-10-CM

## 2017-07-29 DIAGNOSIS — J22 Unspecified acute lower respiratory infection: Secondary | ICD-10-CM

## 2017-07-29 DIAGNOSIS — R059 Cough, unspecified: Secondary | ICD-10-CM

## 2017-07-29 DIAGNOSIS — N183 Chronic kidney disease, stage 3 unspecified: Secondary | ICD-10-CM

## 2017-07-29 DIAGNOSIS — G4700 Insomnia, unspecified: Secondary | ICD-10-CM | POA: Diagnosis not present

## 2017-07-29 LAB — POCT CBC
Granulocyte percent: 59.3 %G (ref 37–80)
HCT, POC: 45.8 % (ref 43.5–53.7)
Hemoglobin: 15.2 g/dL (ref 14.1–18.1)
Lymph, poc: 1.4 (ref 0.6–3.4)
MCH, POC: 29.6 pg (ref 27–31.2)
MCHC: 33.1 g/dL (ref 31.8–35.4)
MCV: 89.3 fL (ref 80–97)
MID (cbc): 0.5 (ref 0–0.9)
MPV: 8.6 fL (ref 0–99.8)
POC Granulocyte: 2.8 (ref 2–6.9)
POC LYMPH PERCENT: 29.9 %L (ref 10–50)
POC MID %: 10.8 %M (ref 0–12)
Platelet Count, POC: 231 10*3/uL (ref 142–424)
RBC: 5.13 M/uL (ref 4.69–6.13)
RDW, POC: 15.8 %
WBC: 4.7 10*3/uL (ref 4.6–10.2)

## 2017-07-29 MED ORDER — ALPRAZOLAM 0.5 MG PO TABS: 0.5000 mg | ORAL_TABLET | Freq: Every day | ORAL | 1 refills | Status: DC

## 2017-07-29 MED ORDER — AZITHROMYCIN 250 MG PO TABS: ORAL_TABLET | ORAL | 0 refills | Status: DC

## 2017-07-29 NOTE — Progress Notes (Signed)
[address] 84 y.o.   Chief Complaint  Patient presents with  . Cough    X 1 WEEK  coughing causes chest pain  . Headache  . Medication Refill    xanax    HISTORY OF PRESENT ILLNESS: This is a 84 y.o. male complaining of one-week history of nonproductive cough, chest congestion, pleuritic chest pain, headache.  Denies difficulty breathing. Has no cardiac history.  Chest pain only when coughing.  Headache is intermittent.  Presently has no headache.  Not associated with any other symptoms.  No injuries.  All symptoms started when he developed URI symptoms. HPI   Prior to Admission medications   Medication Sig Start Date End Date Taking? Authorizing Provider  amLODipine (NORVASC) 5 MG tablet Take 1 tablet (5 mg total) by mouth daily. 05/01/17  Yes Ashlon Lottman, Ines Bloomer, MD  esomeprazole (NEXIUM) 40 MG capsule Take 40 mg by mouth daily. 07/28/16  Yes [provider]  fenofibrate 160 MG tablet Take 160 mg by mouth daily.   Yes [provider]  levothyroxine (SYNTHROID, LEVOTHROID) 100 MCG tablet Take 100 mcg by mouth daily before breakfast.   Yes [provider]  ALPRAZolam (XANAX) 0.5 MG tablet Take 1 tablet (0.5 mg total) by mouth at bedtime. Take as needed only. Patient not taking: Reported on 07/29/2017 04/10/17   Horald Pollen, MD  dicyclomine (BENTYL) 20 MG tablet Take 1 tablet (20 mg total) by mouth 3 (three) times daily before meals. Take as needed for abdominal cramping Patient not taking: Reported on 07/29/2017 05/23/17   Ward, Delice Bison, DO  loperamide (IMODIUM) 2 MG capsule Take 1 capsule (2 mg total) by mouth 4 (four) times daily as needed for diarrhea or loose stools. Patient not taking: Reported on 07/29/2017 05/23/17   Ward, Delice Bison, DO  ondansetron (ZOFRAN ODT) 4 MG disintegrating tablet Take 1 tablet (4 mg total) by mouth every 6 (six) hours as needed for nausea or vomiting. Patient not taking: Reported on 07/29/2017 05/23/17   Ward, Delice Bison, DO   valsartan-hydrochlorothiazide (DIOVAN-HCT) 320-25 MG tablet Take 1 tablet by mouth daily. 07/17/16   [provider]    No Known Allergies  Patient Active Problem List   Diagnosis Date Noted  . CKD (chronic kidney disease) stage 3, GFR 30-59 ml/min (HCC) 05/01/2017  . Recent urinary tract infection 04/10/2017  . Hospital discharge follow-up 04/10/2017  . Insomnia 04/10/2017  . Sepsis ([place]) 03/29/2017  . Hypokalemia 03/29/2017  . Benign essential HTN 03/29/2017  . Prostate cancer ([place]) 03/29/2017  . UNSPECIFIED HEARING LOSS 04/30/2010  . HERNIATED LUMBOSACRAL DISC 02/29/2008  . HYPERTROPHY PROSTATE W/O UR OBST & OTH LUTS 01/26/2008  . NEVUS 03/01/2007  . Hypothyroidism 12/16/2006  . HYPOGONADISM 12/16/2006  . Hyperlipidemia 12/16/2006  . OBSTRUCTIVE SLEEP APNEA 12/16/2006  . GERD 12/16/2006    Past Medical History:  Diagnosis Date  . Anxiety   . Benign pigmented nevus   . Cataract    LEFT EYE  . Depression   . Disc degeneration, lumbar   . Epigastric rebound abdominal tenderness   . Esophageal reflux   . Headache(784.0)   . Hearing loss   . Hemorrhoids   . Hiatal hernia   . Hyperlipidemia   . Hypothyroidism   . Insomnia   . OSA (obstructive sleep apnea)   . Osteoarthritis   . Reflux     Past Surgical History:  Procedure Laterality Date  . BACK SURGERY     L4-L5  .  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 CHOLECYSTECTOMY    . CORNEAL TRANSPLANT     X3 TRANSPLANT AT [facility] CATARACT SURGERY COMPLICATIONS  . EYE SURGERY      Social History   Socioeconomic History  . Marital status: Married    Spouse name: Not on file  . Number of children: Not on file  . Years of education: Not on file  . Highest education level: Not on file  Occupational History  . Occupation: retired    Comment: Therapist, sports in [place]  . Financial resource strain: Not on file  . Food insecurity:    Worry: Not  on file    Inability: Not on file  . Transportation needs:    Medical: Not on file    Non-medical: Not on file  Tobacco Use  . Smoking status: Never Smoker  . Smokeless tobacco: Never Used  Substance and Sexual Activity  . Alcohol use: No  . Drug use: No  . Sexual activity: Never  Lifestyle  . Physical activity:    Days per week: Not on file    Minutes per session: Not on file  . Stress: Not on file  Relationships  . Social connections:    Talks on phone: Not on file    Gets together: Not on file    Attends religious service: Not on file    Active member of club or organization: Not on file    Attends meetings of clubs or organizations: Not on file    Relationship status: Not on file  . Intimate partner violence:    Fear of current or ex partner: Not on file    Emotionally abused: Not on file    Physically abused: Not on file    Forced sexual activity: Not on file  Other Topics Concern  . Not on file  Social History Narrative  . Not on file    Family History  Problem Relation Age of Onset  . Cancer Brother   . Brain cancer Sister      Review of Systems  Constitutional: Negative.  Negative for chills and fever.  HENT: Negative.  Negative for congestion, sinus pain and sore throat.   Eyes: Negative.   Respiratory: Positive for cough.   Cardiovascular: Negative.  Negative for chest pain and palpitations.  Gastrointestinal: Negative.  Negative for abdominal pain, diarrhea, nausea and vomiting.  Genitourinary: Negative.   Musculoskeletal: Negative.   Skin: Negative for rash.  Neurological: Positive for headaches.  Endo/Heme/Allergies: Negative.   All other systems reviewed and are negative.   Vitals:   07/29/17 1412  BP: (!) 116/58  Pulse: (!) 106  Resp: 16  Temp: 98.4 F (36.9 C)  SpO2: 95%    Physical Exam  Constitutional: He is oriented to person, place, and time. He appears well-developed and well-nourished.  HENT:  Head: Normocephalic and  atraumatic.  Nose: Nose normal.  Mouth/Throat: Oropharynx is clear and moist.  Eyes: Conjunctivae and EOM are normal.  Right eye blindness  Neck: Normal range of motion. Neck supple.  Cardiovascular: Normal rate, regular rhythm and normal heart sounds.  Pulmonary/Chest: Effort normal and breath sounds normal.  Abdominal: Soft. There is no tenderness.  Musculoskeletal:  Status post repair of mallet finger; right middle finger  Lymphadenopathy:    He has no cervical adenopathy.  Neurological: He is alert and oriented to person, place, and time. He  displays normal reflexes. No cranial nerve deficit or sensory deficit. He exhibits normal muscle tone. Coordination normal.  Skin: Skin is warm and dry. Capillary refill takes less than 2 seconds. No rash noted.  Psychiatric: He has a normal mood and affect. His behavior is normal.  Vitals reviewed.   Results for orders placed or performed in visit on 07/29/17 (from the past 24 hour(s))  POCT CBC     Status: None   Collection Time: 07/29/17  2:56 PM  Result Value Ref Range   WBC 4.7 4.6 - 10.2 K/uL   Lymph, poc 1.4 0.6 - 3.4   POC LYMPH PERCENT 29.9 10 - 50 %L   MID (cbc) 0.5 0 - 0.9   POC MID % 10.8 0 - 12 %M   POC Granulocyte 2.8 2 - 6.9   Granulocyte percent 59.3 37 - 80 %G   RBC 5.13 4.69 - 6.13 M/uL   Hemoglobin 15.2 14.1 - 18.1 g/dL   HCT, POC 45.8 43.5 - 53.7 %   MCV 89.3 80 - 97 fL   MCH, POC 29.6 27 - 31.2 pg   MCHC 33.1 31.8 - 35.4 g/dL   RDW, POC 15.8 %   Platelet Count, POC 231 142 - 424 K/uL   MPV 8.6 0 - 99.8 fL   Dg Chest 2 View  Result Date: 07/29/2017 CLINICAL DATA:  Cough. EXAM: CHEST - 2 VIEW COMPARISON:  None. FINDINGS: The heart size and mediastinal contours are within normal limits. No pneumothorax or pleural effusion is noted. Left apical density is noted concerning for possible neoplasm. The visualized skeletal structures are unremarkable. IMPRESSION: Left apical density is noted concerning for possible  neoplasm. CT scan of the chest is recommended for further evaluation. These results will be called to the ordering clinician or representative by the Radiologist Assistant, and communication documented in the PACS or zVision Dashboard. Electronically Signed   By: Marijo Conception, M.D.   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 States his chest x-ray always showed this.  Aware of this finding.  EKG: Normal sinus rhythm with ventricular rate 86/min.  No acute ischemic changes.  Normal intervals. A total of 40 minutes was spent in the room with the patient, greater than 50% of which was in counseling/coordination of care regarding differential diagnosis, treatment, medications, need for diagnostic imaging, and follow-up.  ASSESSMENT & PLAN: Kardell was seen today for cough, headache and medication refill.  Diagnoses and all orders for this visit:  Cough -     POCT CBC -     DG Chest 2 View; Future -     EKG 12-Lead  Pleuritic chest pain -     EKG 12-Lead  Insomnia, unspecified type -     ALPRAZolam (XANAX) 0.5 MG tablet; Take 1 tablet (0.5 mg total) by mouth at bedtime. Take as needed only.  Pleurodynia -     CT Chest Wo Contrast; Future  Lower resp. tract infection -     azithromycin (ZITHROMAX) 250 MG tablet; Sig as indicated  Benign essential HTN  CKD (chronic kidney disease) stage 3, GFR 30-59 ml/min [facility])    Patient Instructions       IF you received an x-ray today, you will receive an invoice from [facility]. Please contact [facility] at [phone] with questions or concerns regarding your invoice.   IF you received labwork today, you will receive an invoice from [facility]. Please  contact [facility] at [phone] with questions or concerns regarding your invoice.   Our billing staff will not be able to assist you with questions regarding bills from these companies.  You will be contacted  with the lab results as soon as they are available. The fastest way to get your results is to activate your My Chart account. Instructions are located on the last page of this paperwork. If you have not heard from [place] regarding the results in 2 weeks, please contact this office.     Cough, Adult A cough helps to clear your throat and lungs. A cough may last only 2-3 weeks (acute), or it may last longer than 8 weeks (chronic). Many different things can cause a cough. A cough may be a sign of an illness or another medical condition. Follow these instructions at home:  Pay attention to any changes in your cough.  Take medicines only as told by your doctor. ? If you were prescribed an antibiotic medicine, take it as told by your doctor. Do not stop taking it even if you start to feel better. ? Talk with your doctor before you try using a cough medicine.  Drink enough fluid to keep your pee (urine) clear or pale yellow.  If the air is dry, use a cold steam vaporizer or humidifier in your home.  Stay away from things that make you cough at work or at home.  If your cough is worse at night, try using extra pillows to raise your head up higher while you sleep.  Do not smoke, and try not to be around smoke. If you need help quitting, ask your doctor.  Do not have caffeine.  Do not drink alcohol.  Rest as needed. Contact a doctor if:  You have new problems (symptoms).  You cough up yellow fluid (pus).  Your cough does not get better after 2-3 weeks, or your cough gets worse.  Medicine does not help your cough and you are not sleeping well.  You have pain that gets worse or pain that is not helped with medicine.  You have a fever.  You are losing weight and you do not know why.  You have night sweats. Get help right away if:  You cough up blood.  You have trouble breathing.  Your heartbeat is very fast. This information is not intended to replace advice given to you by your  health care provider. Make sure you discuss any questions you have with your health care provider. Document Released: 11/28/2010 Document Revised: 08/23/2015 Document Reviewed: 05/24/2014 Elsevier Interactive Patient Education  2018 Elsevier Inc.      Agustina Caroli, MD [facility]

## 2017-07-29 NOTE — Patient Instructions (Addendum)
     IF you received an x-ray today, you will receive an invoice from [facility]. Please contact [facility] at [phone] with questions or concerns regarding your invoice.   IF you received labwork today, you will receive an invoice from [facility]. Please contact [facility] at [phone] with questions or concerns regarding your invoice.   Our billing staff will not be able to assist you with questions regarding bills from these companies.  You will be contacted with the lab results as soon as they are available. The fastest way to get your results is to activate your My Chart account. Instructions are located on the last page of this paperwork. If you have not heard from us regarding the results in 2 weeks, please contact this office.     Cough, Adult A cough helps to clear your throat and lungs. A cough may last only 2-3 weeks (acute), or it may last longer than 8 weeks (chronic). Many different things can cause a cough. A cough may be a sign of an illness or another medical condition. Follow these instructions at home:  Pay attention to any changes in your cough.  Take medicines only as told by your doctor. ? If you were prescribed an antibiotic medicine, take it as told by your doctor. Do not stop taking it even if you start to feel better. ? Talk with your doctor before you try using a cough medicine.  Drink enough fluid to keep your pee (urine) clear or pale yellow.  If the air is dry, use a cold steam vaporizer or humidifier in your home.  Stay away from things that make you cough at work or at home.  If your cough is worse at night, try using extra pillows to raise your head up higher while you sleep.  Do not smoke, and try not to be around smoke. If you need help quitting, ask your doctor.  Do not have caffeine.  Do not drink alcohol.  Rest as needed. Contact a doctor if:  You have new problems (symptoms).  You cough up yellow fluid  (pus).  Your cough does not get better after 2-3 weeks, or your cough gets worse.  Medicine does not help your cough and you are not sleeping well.  You have pain that gets worse or pain that is not helped with medicine.  You have a fever.  You are losing weight and you do not know why.  You have night sweats. Get help right away if:  You cough up blood.  You have trouble breathing.  Your heartbeat is very fast. This information is not intended to replace advice given to you by your health care provider. Make sure you discuss any questions you have with your health care provider. Document Released: 11/28/2010 Document Revised: 08/23/2015 Document Reviewed: 05/24/2014 Elsevier Interactive Patient Education  2018 [employer] Inc.  

## 2017-10-06 ENCOUNTER — Ambulatory Visit (HOSPITAL_COMMUNITY)
Admission: RE | Admit: 2017-10-06 | Discharge: 2017-10-06 | Disposition: A | Discharge status: Home / Self Care | Payer: 59 | Source: Ambulatory Visit | Attending: Emergency Medicine | Admitting: Emergency Medicine

## 2017-10-06 ENCOUNTER — Other Ambulatory Visit: Payer: Self-pay

## 2017-10-06 ENCOUNTER — Ambulatory Visit (INDEPENDENT_AMBULATORY_CARE_PROVIDER_SITE_OTHER): Payer: 59 | Admitting: Emergency Medicine

## 2017-10-06 ENCOUNTER — Encounter: Payer: Self-pay | Admitting: Emergency Medicine

## 2017-10-06 VITALS — BP 118/70 | HR 74 | Temp 97.8°F | Resp 16 | Ht 65.0 in | Wt 162.4 lb

## 2017-10-06 DIAGNOSIS — Z9181 History of falling: Secondary | ICD-10-CM | POA: Diagnosis not present

## 2017-10-06 DIAGNOSIS — G44309 Post-traumatic headache, unspecified, not intractable: Secondary | ICD-10-CM

## 2017-10-06 DIAGNOSIS — S0990XA Unspecified injury of head, initial encounter: Secondary | ICD-10-CM | POA: Diagnosis not present

## 2017-10-06 NOTE — Progress Notes (Signed)
[address] 84 y.o.   Chief Complaint  Patient presents with  . Fall    X 2 falls 2 weeks ago, and has seen eye doctor on 10/05/2017 advised to see you    HISTORY OF PRESENT ILLNESS: This is a 84 y.o. male complaining of 2 falls during the last 2 weeks both associated with sudden turning to his right, losing balance, and falling  to the ground.  Denies LOC either time.  Complaining of intermittent posterior headache.  Intermittent nausea but no vomiting.  Has a history of glaucoma with visual loss of the right eye.  Saw the ophthalmologist yesterday and recommended to get CT of the brain.  First fall he was outside and hit the back of the head on the ground grass.  Second fall happened in his bathroom and hit head on the toilet.  No LOC and no soft tissue swelling of the scalp.  No other significant symptomatology   HPI   Prior to Admission medications   Medication Sig Start Date End Date Taking? Authorizing Provider  ALPRAZolam Duanne Moron) 0.5 MG tablet Take 1 tablet (0.5 mg total) by mouth at bedtime. Take as needed only. 07/29/17  Yes Murrel Freet, Ines Bloomer, MD  esomeprazole (NEXIUM) 40 MG capsule Take 40 mg by mouth daily. 07/28/16  Yes [provider]  fenofibrate 160 MG tablet Take 160 mg by mouth daily.   Yes [provider]  levothyroxine (SYNTHROID, LEVOTHROID) 100 MCG tablet Take 100 mcg by mouth daily before breakfast.   Yes [provider]  loperamide (IMODIUM) 2 MG capsule Take 1 capsule (2 mg total) by mouth 4 (four) times daily as needed for diarrhea or loose stools. 05/23/17  Yes Ward, Cyril Mourning N, DO  ondansetron (ZOFRAN ODT) 4 MG disintegrating tablet Take 1 tablet (4 mg total) by mouth every 6 (six) hours as needed for nausea or vomiting. 05/23/17  Yes Ward, Cyril Mourning N, DO  valsartan-hydrochlorothiazide (DIOVAN-HCT) 320-25 MG tablet Take 1 tablet by mouth daily. 07/17/16  Yes [provider]  amLODipine (NORVASC) 5 MG tablet Take 1 tablet (5 mg  total) by mouth daily. Patient not taking: Reported on 10/06/2017 05/01/17   Horald Pollen, MD  dicyclomine (BENTYL) 20 MG tablet Take 1 tablet (20 mg total) by mouth 3 (three) times daily before meals. Take as needed for abdominal cramping Patient not taking: Reported on 07/29/2017 05/23/17   Ward, Delice Bison, DO    No Known Allergies  Patient Active Problem List   Diagnosis Date Noted  . History of fall 10/06/2017  . Recent head trauma 10/06/2017  . Post-traumatic headache, not intractable 10/06/2017  . Cough 07/29/2017  . Pleurodynia 07/29/2017  . Lower resp. tract infection 07/29/2017  . CKD (chronic kidney disease) stage 3, GFR 30-59 ml/min (HCC) 05/01/2017  . Recent urinary tract infection 04/10/2017  . Hospital discharge follow-up 04/10/2017  . Insomnia 04/10/2017  . Sepsis ([place]) 03/29/2017  . Hypokalemia 03/29/2017  . Benign essential HTN 03/29/2017  . Prostate cancer ([place]) 03/29/2017  . UNSPECIFIED HEARING LOSS 04/30/2010  . HERNIATED LUMBOSACRAL DISC 02/29/2008  . HYPERTROPHY PROSTATE W/O UR OBST & OTH LUTS 01/26/2008  . NEVUS 03/01/2007  . Hypothyroidism 12/16/2006  . HYPOGONADISM 12/16/2006  . Hyperlipidemia 12/16/2006  . OBSTRUCTIVE SLEEP APNEA 12/16/2006  . GERD 12/16/2006    Past Medical History:  Diagnosis Date  . Anxiety   . Benign pigmented nevus   . Cataract    LEFT EYE  . Depression   . Disc  degeneration, lumbar   . Epigastric rebound abdominal tenderness   . Esophageal reflux   . Headache(784.0)   . Hearing loss   . Hemorrhoids   . Hiatal hernia   . Hyperlipidemia   . Hypothyroidism   . Insomnia   . OSA (obstructive sleep apnea)   . Osteoarthritis   . Reflux     Past Surgical History:  Procedure Laterality Date  . BACK SURGERY     L4-L5  . 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 CHOLECYSTECTOMY    . CORNEAL TRANSPLANT     X3 TRANSPLANT AT [facility] CATARACT SURGERY COMPLICATIONS    . EYE SURGERY      Social History   Socioeconomic History  . Marital status: Married    Spouse name: Not on file  . Number of children: Not on file  . Years of education: Not on file  . Highest education level: Not on file  Occupational History  . Occupation: retired    Comment: Therapist, sports in [place]  . Financial resource strain: Not on file  . Food insecurity:    Worry: Not on file    Inability: Not on file  . Transportation needs:    Medical: Not on file    Non-medical: Not on file  Tobacco Use  . Smoking status: Never Smoker  . Smokeless tobacco: Never Used  Substance and Sexual Activity  . Alcohol use: No  . Drug use: No  . Sexual activity: Never  Lifestyle  . Physical activity:    Days per week: Not on file    Minutes per session: Not on file  . Stress: Not on file  Relationships  . Social connections:    Talks on phone: Not on file    Gets together: Not on file    Attends religious service: Not on file    Active member of club or organization: Not on file    Attends meetings of clubs or organizations: Not on file    Relationship status: Not on file  . Intimate partner violence:    Fear of current or ex partner: Not on file    Emotionally abused: Not on file    Physically abused: Not on file    Forced sexual activity: Not on file  Other Topics Concern  . Not on file  Social History Narrative  . Not on file    Family History  Problem Relation Age of Onset  . Cancer Brother   . Brain cancer Sister      Review of Systems  Constitutional: Negative for chills, fever and weight loss.  HENT: Negative.  Negative for congestion, nosebleeds and sore throat.   Eyes:       No vision on the right eye secondary to glaucoma  Respiratory: Negative.  Negative for cough, shortness of breath and wheezing.   Cardiovascular: Negative.  Negative for chest pain and palpitations.  Gastrointestinal: Negative.  Negative for abdominal pain, blood in stool,  melena, nausea and vomiting.  Genitourinary: Negative.  Negative for dysuria and hematuria.  Musculoskeletal: Positive for back pain (Chronic).  Skin: Negative.  Negative for rash.  Neurological: Positive for headaches. Negative for dizziness, sensory change, speech change, focal weakness, seizures and loss of consciousness.  Endo/Heme/Allergies: Negative.   All other systems reviewed and are negative.   Vitals:   10/06/17 1216  BP: 118/70  Pulse: 74  Resp: 16  Temp: 97.8 F (36.6 C)  SpO2: 99%    Physical Exam  Constitutional: He is oriented to person, place, and time. He appears well-developed and well-nourished.  HENT:  Head: Normocephalic and atraumatic.  Nose: Nose normal.  Mouth/Throat: Oropharynx is clear and moist.  Eyes: Conjunctivae are normal.  Clouded right eye with no vision  Neck: Normal range of motion. Neck supple.  Cardiovascular: Normal rate and regular rhythm.  Pulmonary/Chest: Effort normal and breath sounds normal.  Abdominal: Soft. There is no tenderness.  Musculoskeletal: Normal range of motion.  Neurological: He is alert and oriented to person, place, and time. He displays normal reflexes. No cranial nerve deficit or sensory deficit. He exhibits normal muscle tone. Coordination normal.  Skin: Skin is warm and dry. Capillary refill takes less than 2 seconds. No rash noted.  Psychiatric: He has a normal mood and affect. His behavior is normal.  Vitals reviewed.  Ct Head Wo Contrast  Result Date: 10/06/2017 CLINICAL DATA:  Persistent headache after fall 2 weeks ago. EXAM: CT HEAD WITHOUT CONTRAST TECHNIQUE: Contiguous axial images were obtained from the base of the skull through the vertex without intravenous contrast. COMPARISON:  MR brain dated June 22, 2015. CT head dated March 22, 2009. FINDINGS: Brain: No evidence of acute infarction, hemorrhage, hydrocephalus, extra-axial collection or mass lesion/mass effect. Stable cerebral atrophy and chronic  microvascular ischemic changes. Vascular: Atherosclerotic vascular calcification of the carotid siphons. No hyperdense vessel. Skull: Normal. Negative for fracture or focal lesion. Sinuses/Orbits: No acute finding. Unchanged small rim calcified cystic lesion in the right inferior medial orbit adjacent to the globe. Other: None. IMPRESSION: 1.  No acute intracranial abnormality. These results will be called to the ordering clinician or representative by the Radiology Department at the imaging location. Electronically Signed   By: Titus Dubin M.D.   On: 10/06/2017 14:26     ASSESSMENT & PLAN: [employer] was seen today for fall.  Diagnoses and all orders for this visit:  Post-traumatic headache, not intractable, unspecified chronicity pattern -     CT Head Wo Contrast; Future -     Ambulatory referral to Neurology  History of fall  Recent head trauma, initial encounter    Patient Instructions   Go to [facility], apppointment is at 2:30 pm, be there at 2:00 pm. Go to the Main Entrance and to front desk, let them know you have an appointment in Radiology.    IF you received an x-ray today, you will receive an invoice from [facility] Radiology. Please contact [facility] Radiology at [phone] with questions or concerns regarding your invoice.   IF you received labwork today, you will receive an invoice from [facility]. Please contact [facility] at [phone] with questions or concerns regarding your invoice.   Our billing staff will not be able to assist you with questions regarding bills from these companies.  You will be contacted with the lab results as soon as they are available. The fastest way to get your results is to activate your My Chart account. Instructions are located on the last page of this paperwork. If you have not heard from [place] regarding the results in 2 weeks, please contact this office.     Sndrome posconmocional (Post-Concussion Syndrome) El sndrome  posconmocional se refiere a los sntomas que pueden ocurrir despus de una lesin en la cabeza. Estos sntomas pueden durar desde varias semanas hasta meses. CAUSAS No se sabe por qu algunas lesiones pueden causar este sndrome. Puede ocurrir tanto si la  lesin fue leve o grave, y si ha usado proteccin o no. SIGNOS Y SNTOMAS  Dificultades de memoria.  Mareos.  Dolores de [place].  Visin doble o borrosa.  Sensibilidad a Naval architect.  Dificultad para or.  Depresin.  Cansancio.  Debilidad.  Dificultad para concentrarse.  Dificultad para dormirse o para continuar durmiendo.  Vmitos.  Desequilibrio o inestabilidad en los pies.  Reaccin lenta.  Dificultad para aprender o recordar cosas que ha escuchado. DIAGNSTICO No hay una prueba para determinar si sufre este sndrome. El mdico podr indicar un estudio por imgenes del cerebro, como una tomografa computarizada, para descartar otros problemas que puedan estar causando los sntomas (como una lesin grave dentro del crneo). TRATAMIENTO Generalmente, estos problemas desaparecen sin atencin mdica. El mdico podr recetar algunos medicamentos para [employer] sntomas. Es importante hacer controles con un neurlogo para Mudlogger recuperacin y Clinical biochemist los sntomas o problemas persistentes. [place] los medicamentos solamente como se lo haya indicado el mdico. No tome aspirina. La aspirina puede disminuir el tiempo de [place].  Duerma con la cabeza ligeramente levantada para [employer].  Evite cualquier situacin donde sea posible lesionarse la cabeza de nuevo. Esto incluye practicar actividades como ftbol americano, hockey, ftbol, bsquet, artes marciales, deportes extremos en la nieve y equitacin. El problema empeorar cada vez que experimente una conmocin cerebral. Debe evitar estas actividades hasta que sea evaluado por el mdico correspondiente.  Concurra a  todas las visitas de control como se lo haya indicado el mdico. Esto es importante. SOLICITE ATENCIN MDICA SI:  Aumentan sus problemas de atencin o concentracin.  Aumentan sus dificultades para recordar o aprender informacin nueva.  Necesita ms tiempo que antes para completar las tareas o asignaciones.  Aumenta la irritabilidad o disminuye la capacidad para Animal nutritionist.  Tiene ms sntomas que antes. Solicite atencin mdica si presenta cualquiera de los siguientes sntomas durante ms de [employer] despus de la lesin:  Dolores de cabeza que perduran (crnicos).  Mareos o problemas con el equilibrio.  Nuseas.  Problemas de visin.  Aumento de la sensibilidad a los ruidos o Naval architect.  Depresin y cambios en el estado de nimo.  Ansiedad o irritabilidad.  Problemas de memoria.  Dificultad para concentrarse o Lawyer.  Problemas para dormir.  Cansancio permanente. SOLICITE ATENCIN MDICA DE INMEDIATO SI:  Siente confusin o somnolencia poco habitual.  Otras personas tienen dificultades para despertarlo.  Tiene nuseas o vmitos fuertes y persistentes.  Tiene la sensacin de que se est moviendo mientras est quieto (vrtigo). Tiene movimientos oculares rpidos de un lado al otro.  Tiene convulsiones o se desmaya.  Tiene dolores de [place] intensos y persistentes que no se calman con los medicamentos.  No puede mover los brazos o las piernas normalmente.  Una pupila est ms grande que la otra.  Le sale una secrecin clara o con sangre de la nariz o de los odos.  Los problemas empeoran en vez de Teacher, English as a foreign language. ASEGRESE DE QUE:  Comprende estas instrucciones.  Controlar su afeccin.  Recibir ayuda de inmediato si no mejora o si empeora. Esta informacin no tiene Marine scientist el consejo del mdico. Asegrese de hacerle al mdico cualquier pregunta que tenga. Document Released: 12/25/2004 Document Revised: 04/07/2014 Document  Reviewed: 06/22/2013 Elsevier Interactive Patient Education  2018 Elsevier Inc.      Agustina Caroli, MD [facility]

## 2017-10-06 NOTE — Patient Instructions (Addendum)
Go to [facility], apppointment is at 2:30 pm, be there at 2:00 pm. Go to the Main Entrance and to front desk, let them know you have an appointment in Radiology.    IF you received an x-ray today, you will receive an invoice from [facility] Radiology. Please contact [facility] Radiology at [phone] with questions or concerns regarding your invoice.   IF you received labwork today, you will receive an invoice from [place]. Please contact [facility] at [phone] with questions or concerns regarding your invoice.   Our billing staff will not be able to assist you with questions regarding bills from these companies.  You will be contacted with the lab results as soon as they are available. The fastest way to get your results is to activate your My Chart account. Instructions are located on the last page of this paperwork. If you have not heard from [place] regarding the results in 2 weeks, please contact this office.     Sndrome posconmocional (Post-Concussion Syndrome) El sndrome posconmocional se refiere a los sntomas que pueden ocurrir despus de una lesin en la cabeza. Estos sntomas pueden durar desde varias semanas hasta meses. CAUSAS No se sabe por qu algunas lesiones pueden causar este sndrome. Puede ocurrir tanto si la lesin fue leve o grave, y si ha usado proteccin o no. SIGNOS Y SNTOMAS  Dificultades de memoria.  Mareos.  Dolores de [place].  Visin doble o borrosa.  Sensibilidad a Naval architect.  Dificultad para or.  Depresin.  Cansancio.  Debilidad.  Dificultad para concentrarse.  Dificultad para dormirse o para continuar durmiendo.  Vmitos.  Desequilibrio o inestabilidad en los pies.  Reaccin lenta.  Dificultad para aprender o recordar cosas que ha escuchado. DIAGNSTICO No hay una prueba para determinar si sufre este sndrome. El mdico podr indicar un estudio por imgenes del cerebro, como una tomografa computarizada, para descartar  otros problemas que puedan estar causando los sntomas (como una lesin grave dentro del crneo). TRATAMIENTO Generalmente, estos problemas desaparecen sin atencin mdica. El mdico podr recetar algunos medicamentos para [employer] sntomas. Es importante hacer controles con un neurlogo para Mudlogger recuperacin y Clinical biochemist los sntomas o problemas persistentes. [place] los medicamentos solamente como se lo haya indicado el mdico. No tome aspirina. La aspirina puede disminuir el tiempo de [place].  Duerma con la cabeza ligeramente levantada para [employer].  Evite cualquier situacin donde sea posible lesionarse la cabeza de nuevo. Esto incluye practicar actividades como ftbol americano, hockey, ftbol, bsquet, artes marciales, deportes extremos en la nieve y equitacin. El problema empeorar cada vez que experimente una conmocin cerebral. Debe evitar estas actividades hasta que sea evaluado por el mdico correspondiente.  Concurra a todas las visitas de control como se lo haya indicado el mdico. Esto es importante. SOLICITE ATENCIN MDICA SI:  Aumentan sus problemas de atencin o concentracin.  Aumentan sus dificultades para recordar o aprender informacin nueva.  Necesita ms tiempo que antes para completar las tareas o asignaciones.  Aumenta la irritabilidad o disminuye la capacidad para Animal nutritionist.  Tiene ms sntomas que antes. Solicite atencin mdica si presenta cualquiera de los siguientes sntomas durante ms de [employer] despus de la lesin:  Dolores de cabeza que perduran (crnicos).  Mareos o problemas con el equilibrio.  Nuseas.  Problemas de visin.  Aumento de la sensibilidad a los ruidos o Naval architect.  Depresin y cambios en el estado de nimo.  Ansiedad o  irritabilidad.  Problemas de memoria.  Dificultad para concentrarse o Lawyer.  Problemas para dormir.  Cansancio  permanente. SOLICITE ATENCIN MDICA DE INMEDIATO SI:  Siente confusin o somnolencia poco habitual.  Otras personas tienen dificultades para despertarlo.  Tiene nuseas o vmitos fuertes y persistentes.  Tiene la sensacin de que se est moviendo mientras est quieto (vrtigo). Tiene movimientos oculares rpidos de un lado al otro.  Tiene convulsiones o se desmaya.  Tiene dolores de [place] intensos y persistentes que no se calman con los medicamentos.  No puede mover los brazos o las piernas normalmente.  Una pupila est ms grande que la otra.  Le sale una secrecin clara o con sangre de la nariz o de los odos.  Los problemas empeoran en vez de Teacher, English as a foreign language. ASEGRESE DE QUE:  Comprende estas instrucciones.  Controlar su afeccin.  Recibir ayuda de inmediato si no mejora o si empeora. Esta informacin no tiene Marine scientist el consejo del mdico. Asegrese de hacerle al mdico cualquier pregunta que tenga. Document Released: 12/25/2004 Document Revised: 04/07/2014 Document Reviewed: 06/22/2013 Elsevier Interactive Patient Education  2018 [employer].

## 2017-10-30 ENCOUNTER — Ambulatory Visit: Payer: 59 | Admitting: Emergency Medicine

## 2017-12-07 ENCOUNTER — Ambulatory Visit: Payer: 59 | Admitting: Neurology

## 2018-01-11 ENCOUNTER — Encounter (HOSPITAL_COMMUNITY): Payer: Self-pay | Admitting: Emergency Medicine

## 2018-01-11 ENCOUNTER — Other Ambulatory Visit: Payer: Self-pay

## 2018-01-11 ENCOUNTER — Ambulatory Visit (INDEPENDENT_AMBULATORY_CARE_PROVIDER_SITE_OTHER): Payer: 59

## 2018-01-11 ENCOUNTER — Ambulatory Visit (HOSPITAL_COMMUNITY)
Admission: EM | Admit: 2018-01-11 | Discharge: 2018-01-11 | Disposition: A | Discharge status: Home / Self Care | Payer: 59 | Attending: Family Medicine | Admitting: Family Medicine

## 2018-01-11 DIAGNOSIS — M25511 Pain in right shoulder: Secondary | ICD-10-CM | POA: Diagnosis not present

## 2018-01-11 MED ORDER — ACETAMINOPHEN 325 MG PO TABS
ORAL_TABLET | ORAL | Status: AC
  Filled 2018-01-11: qty 2

## 2018-01-11 MED ORDER — ACETAMINOPHEN 500 MG PO TABS: 500.0000 mg | ORAL_TABLET | Freq: Four times a day (QID) | ORAL | 0 refills | Status: DC | PRN

## 2018-01-11 MED ORDER — ACETAMINOPHEN 325 MG PO TABS
650.0000 mg | ORAL_TABLET | Freq: Once | ORAL | Status: AC
  Administered 2018-01-11: 650 mg via ORAL

## 2018-01-11 NOTE — ED Triage Notes (Signed)
Pt reports falling in the bath tub 4 months ago and hitting his right shoulder.  Pt was not having much pain until yesterday.  He has limited ROM with the right arm and he points to the top of his shoulder for pain.  His daughter is with him and is interpreting during triage.

## 2018-01-11 NOTE — ED Provider Notes (Signed)
[name]    CSN: [id number] Arrival date & time: 01/11/18  1601     History   Chief Complaint Chief Complaint  Patient presents with  . Fall  . Shoulder Pain    right    HPI [name] is a 84 y.o. male.   Patient is an 84 year old male with past medical history of anxiety, depression, headache, hemorrhoids, hiatal hernia, hyperlipidemia, hypothyroidism, osteoarthritis.  He presents with 1 day of right shoulder pain mostly located to the superior aspect of the shoulder near the [facility] joint.  He was cleaning the bathroom yesterday when this started with reaching his arm up.  He reports he had a fall about 4 months ago landing on that right arm but did not really have any issues post fall.  There is no redness or swelling to the area.  He denies any associated chest pain, shortness breath, palpitations, numbness, tingling, weakness.  ROS per HPI      Past Medical History:  Diagnosis Date  . Anxiety   . Benign pigmented nevus   . Cataract    LEFT EYE  . Depression   . Disc degeneration, lumbar   . Epigastric rebound abdominal tenderness   . Esophageal reflux   . Headache(784.0)   . Hearing loss   . Hemorrhoids   . Hiatal hernia   . Hyperlipidemia   . Hypothyroidism   . Insomnia   . OSA (obstructive sleep apnea)   . Osteoarthritis   . Reflux     Patient Active Problem List   Diagnosis Date Noted  . History of fall 10/06/2017  . Recent head trauma 10/06/2017  . Post-traumatic headache, not intractable 10/06/2017  . Cough 07/29/2017  . Pleurodynia 07/29/2017  . Lower resp. tract infection 07/29/2017  . CKD (chronic kidney disease) stage 3, GFR 30-59 ml/min (HCC) 05/01/2017  . Recent urinary tract infection 04/10/2017  . Hospital discharge follow-up 04/10/2017  . Insomnia 04/10/2017  . Sepsis ([place]) 03/29/2017  . Hypokalemia 03/29/2017  . Benign essential HTN 03/29/2017  . Prostate cancer ([place]) 03/29/2017  . UNSPECIFIED HEARING LOSS 04/30/2010   . HERNIATED LUMBOSACRAL DISC 02/29/2008  . HYPERTROPHY PROSTATE W/O UR OBST & OTH LUTS 01/26/2008  . NEVUS 03/01/2007  . Hypothyroidism 12/16/2006  . HYPOGONADISM 12/16/2006  . Hyperlipidemia 12/16/2006  . OBSTRUCTIVE SLEEP APNEA 12/16/2006  . GERD 12/16/2006    Past Surgical History:  Procedure Laterality Date  . BACK SURGERY     L4-L5  . 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 CHOLECYSTECTOMY    . CORNEAL TRANSPLANT     X3 TRANSPLANT AT [facility] CATARACT SURGERY COMPLICATIONS  . EYE SURGERY         Home Medications    Prior to Admission medications   Medication Sig Start Date End Date Taking? Authorizing Provider  esomeprazole (NEXIUM) 40 MG capsule Take 40 mg by mouth daily. 07/28/16  Yes [provider]  fenofibrate 160 MG tablet Take 160 mg by mouth daily.   Yes [provider]  gabapentin (NEURONTIN) 300 MG capsule Take by mouth.   Yes [provider]  levothyroxine (SYNTHROID, LEVOTHROID) 100 MCG tablet Take 100 mcg by mouth daily before breakfast.   Yes [provider]  mirabegron ER (MYRBETRIQ) 50 MG TB24 tablet Take 50 mg by mouth daily.   Yes [provider]  valsartan-hydrochlorothiazide (DIOVAN-HCT) 320-25 MG tablet Take 1 tablet by mouth  daily. 07/17/16  Yes [provider]  acetaminophen (TYLENOL) 500 MG tablet Take 1 tablet (500 mg total) by mouth every 6 (six) hours as needed. 01/11/18   Loura Halt A, NP  ALPRAZolam (XANAX) 0.5 MG tablet Take 1 tablet (0.5 mg total) by mouth at bedtime. Take as needed only. 07/29/17   Horald Pollen, MD  amLODipine (NORVASC) 5 MG tablet Take 1 tablet (5 mg total) by mouth daily. Patient not taking: Reported on 10/06/2017 05/01/17   Horald Pollen, MD  dicyclomine (BENTYL) 20 MG tablet Take 1 tablet (20 mg total) by mouth 3 (three) times daily before meals. Take as needed for abdominal cramping Patient not taking:  Reported on 07/29/2017 05/23/17   Ward, Delice Bison, DO  loperamide (IMODIUM) 2 MG capsule Take 1 capsule (2 mg total) by mouth 4 (four) times daily as needed for diarrhea or loose stools. 05/23/17   Ward, Delice Bison, DO  ondansetron (ZOFRAN ODT) 4 MG disintegrating tablet Take 1 tablet (4 mg total) by mouth every 6 (six) hours as needed for nausea or vomiting. 05/23/17   Ward, Delice Bison, DO    Family History Family History  Problem Relation Age of Onset  . Cancer Brother   . Brain cancer Sister     Social History Social History   Tobacco Use  . Smoking status: Never Smoker  . Smokeless tobacco: Never Used  Substance Use Topics  . Alcohol use: No  . Drug use: No     Allergies   Hydrocodone   Review of Systems Review of Systems  Constitutional: Positive for activity change.  Respiratory: Negative for shortness of breath.   Cardiovascular: Negative for chest pain, palpitations and leg swelling.  Musculoskeletal: Negative for arthralgias, back pain, gait problem, joint swelling, myalgias, neck pain and neck stiffness.  Skin: Negative for color change, pallor, rash and wound.     Physical Exam Triage Vital Signs ED Triage Vitals  Enc Vitals Group     BP 01/11/18 0939 134/83     Pulse Rate 01/11/18 0939 91     Resp 01/11/18 0939 18     Temp 01/11/18 0939 98.6 F (37 C)     Temp Source 01/11/18 0939 Oral     SpO2 01/11/18 0939 100 %     Weight --      Height --      Head Circumference --      Peak Flow --      Pain Score 01/11/18 0942 10     Pain Loc --      Pain Edu? --      Excl. in [place]? --    No data found.  Updated Vital Signs BP 134/83 (BP Location: Left Arm)   Pulse 91   Temp 98.6 F (37 C) (Oral)   Resp 18   SpO2 100%   Visual Acuity Right Eye Distance:   Left Eye Distance:   Bilateral Distance:    Right Eye Near:   Left Eye Near:    Bilateral Near:     Physical Exam  Constitutional: He is oriented to person, place, and time. He appears  well-developed and well-nourished.  Very pleasant. Non toxic or ill appearing.   HENT:  Head: Normocephalic and atraumatic.  Eyes: Conjunctivae are normal.  Neck: Normal range of motion.  Cardiovascular: Normal rate, regular rhythm and normal heart sounds.  Pulmonary/Chest: Effort normal and breath sounds normal.  Musculoskeletal:  Negative empty can test. Negative Neer's.  Patient able to extend, hyperextend, abduct with no pain.  Pain elicited with adduction, and internal and external rotation. Tender to palpation over the [facility] joint.  No swelling, erythema, bruising or deformity.   Neurological: He is alert and oriented to person, place, and time.  Skin: Skin is warm and dry.  Psychiatric: He has a normal mood and affect.  Nursing note and vitals reviewed.    UC Treatments / Results  Labs (all labs ordered are listed, but only abnormal results are displayed) Labs Reviewed - No data to display  EKG None  Radiology Dg Shoulder Right  Result Date: 01/11/2018 CLINICAL DATA:  84 year old male with remote and recent injury. Right shoulder pain. Initial encounter. EXAM: RIGHT SHOULDER - 2+ VIEW COMPARISON:  01/28/2016. FINDINGS: No fracture or dislocation. Very mild acromioclavicular joint degenerative changes. Visualized lungs clear. IMPRESSION: No fracture or dislocation. Electronically Signed   By: Genia Del M.D.   On: 01/11/2018 10:15    Procedures Procedures (including critical care time)  Medications Ordered in UC Medications  acetaminophen (TYLENOL) tablet 650 mg (650 mg Oral Given 01/11/18 0957)    Initial Impression / Assessment and Plan / UC Course  I have reviewed the triage vital signs and the nursing notes.  Pertinent labs & imaging results that were available during my care of the patient were reviewed by me and considered in my medical decision making (see chart for details).    Likely arthritis X-ray was negative for any abnormalities We will do extra  strength Tylenol due to patient's chronic kidney disease Range of motion exercises Instructed that if his pain is not better in the next week or worsens to follow-up with primary care for further management Patient agreeable and understanding Translator used for all instructions Final Clinical Impressions(s) / UC Diagnoses   Final diagnoses:  Acute pain of right shoulder     Discharge Instructions     It was nice meeting you!!  Your x-ray was negative for any fracture or abnormality's. We will do extra strength Tylenol for the next week to see if this helps with pain inflammation.  You can take 2 of these every 6 hours.  Do not exceed 3000 mg of Tylenol in 1 day Make sure you are doing range of motion exercises and stretching of the shoulder He can do ice/heat If no improvement in the next week please follow-up with your primary care for further management.    ED Prescriptions    Medication Sig Dispense Auth. Provider   acetaminophen (TYLENOL) 500 MG tablet Take 1 tablet (500 mg total) by mouth every 6 (six) hours as needed. 30 tablet Loura Halt A, NP     Controlled Substance Prescriptions [place] Controlled Substance Registry consulted? Not Applicable   Orvan July, NP 01/11/18 1036

## 2018-01-11 NOTE — Discharge Instructions (Addendum)
It was nice meeting you!!  Your x-ray was negative for any fracture or abnormality's. We will do extra strength Tylenol for the next week to see if this helps with pain inflammation.  You can take 2 of these every 6 hours.  Do not exceed 3000 mg of Tylenol in 1 day Make sure you are doing range of motion exercises and stretching of the shoulder He can do ice/heat If no improvement in the next week please follow-up with your primary care for further management.

## 2018-01-25 ENCOUNTER — Ambulatory Visit: Payer: 59 | Admitting: Neurology

## 2018-02-09 ENCOUNTER — Encounter: Payer: Self-pay | Admitting: Neurology

## 2018-02-09 ENCOUNTER — Ambulatory Visit (INDEPENDENT_AMBULATORY_CARE_PROVIDER_SITE_OTHER): Payer: 59 | Admitting: Neurology

## 2018-02-09 VITALS — BP 158/87 | HR 85 | Ht 65.0 in | Wt 165.0 lb

## 2018-02-09 DIAGNOSIS — R51 Headache: Secondary | ICD-10-CM

## 2018-02-09 DIAGNOSIS — R519 Headache, unspecified: Secondary | ICD-10-CM

## 2018-02-09 DIAGNOSIS — G4733 Obstructive sleep apnea (adult) (pediatric): Secondary | ICD-10-CM

## 2018-02-09 DIAGNOSIS — E663 Overweight: Secondary | ICD-10-CM

## 2018-02-09 DIAGNOSIS — R419 Unspecified symptoms and signs involving cognitive functions and awareness: Secondary | ICD-10-CM

## 2018-02-09 DIAGNOSIS — Z9181 History of falling: Secondary | ICD-10-CM | POA: Diagnosis not present

## 2018-02-09 DIAGNOSIS — G4719 Other hypersomnia: Secondary | ICD-10-CM

## 2018-02-09 NOTE — Progress Notes (Signed)
Subjective:    Patient ID: [name] is a 84 y.o. male.  HPI     Star Age, MD, PhD [facility] [address], [address]O. [place], [place] [address]  Dear Dr. Mitchel Honour,   I saw your patient, [name], upon your kind request, in my neurologic clinic today for initial consultation of his recurrent headaches reported patient is accompanied by his daughter and his interpreter, [name], today. As you know, [name] is an 84 year old right-handed gentleman with an underlying medical history of recent falls, chronic kidney disease, hypertension, history of prostate cancer, hearing loss, hypothyroidism, thyroid nodules, vision loss, hypogonadism, hyperlipidemia, sleep apnea, reflux disease and mildly overweight state, who reports recurrent headaches for several months. They became worse after his falls recently. He has had morning headaches. His headaches are bifrontal and short-lived, lasting sometimes for seconds at a time with a severe sharp sensation. He has no significant neurological accompaniments. He has a history of taking ibuprofen on a regular basis for nearly 10 years but had to stop taking ibuprofen secondary to renal impairment. Lab work from February 2019 was reviewed. He has not been able to use his CPAP. He feels that it causes discomfort. He had sleep study testing originally in 2006 which showed mild to moderate sleep apnea and then in 2012 he was diagnosed with severe sleep apnea with an AHI of 44. He was supposed to have a sleep study last year but could not have it done because of lack of interpreter at the time. He does not sleep very well. He is tired during the day, Epworth sleepiness score is 10 out of 24. Fatigue score is 39 out of 63. He is sluggish with his speech first thing in the morning. He is married and lives with his wife and 2 of his 4 children. He has 2 other older children from before. He reports difficulty with his short-term  memory. He is supposed to have a thyroid nodule biopsy on 02/11/2018. I reviewed your office note from 10/06/2017. You ordered a [place], as reported to recent falls with injury to the head. He fell backwards once outside and hit his head backwards. He also fell inside the home in the bathroom and hit his head to the toilet commode. He had a head CT without contrast on 10/06/2017 and I reviewed the results: IMPRESSION: 1. No acute intracranial abnormality. He is a nonsmoker and does not utilize alcohol, drinks caffeine in the form of coffee, maybe 1 or 2 per day, up to 3 a day on average.   His Past Medical History Is Significant For: Past Medical History:  Diagnosis Date  . Anxiety   . Benign pigmented nevus   . Cataract    LEFT EYE  . Depression   . Disc degeneration, lumbar   . Epigastric rebound abdominal tenderness   . Esophageal reflux   . Headache(784.0)   . Hearing loss   . Hemorrhoids   . Hiatal hernia   . Hyperlipidemia   . Hypothyroidism   . Insomnia   . OSA (obstructive sleep apnea)   . Osteoarthritis   . Reflux     His Past Surgical History Is Significant For: Past Surgical History:  Procedure Laterality Date  . BACK SURGERY     L4-L5  . 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 CHOLECYSTECTOMY    . CORNEAL TRANSPLANT  X3 TRANSPLANT AT [facility] CATARACT SURGERY COMPLICATIONS  . EYE SURGERY      His Family History Is Significant For: Family History  Problem Relation Age of Onset  . Cancer Brother   . Brain cancer Sister     His Social History Is Significant For: Social History   Socioeconomic History  . Marital status: Married    Spouse name: Not on file  . Number of children: Not on file  . Years of education: Not on file  . Highest education level: Not on file  Occupational History  . Occupation: retired    Comment: Therapist, sports in [place]  . Financial resource strain: Not on file  . Food  insecurity:    Worry: Not on file    Inability: Not on file  . Transportation needs:    Medical: Not on file    Non-medical: Not on file  Tobacco Use  . Smoking status: Never Smoker  . Smokeless tobacco: Never Used  Substance and Sexual Activity  . Alcohol use: No  . Drug use: No  . Sexual activity: Never  Lifestyle  . Physical activity:    Days per week: Not on file    Minutes per session: Not on file  . Stress: Not on file  Relationships  . Social connections:    Talks on phone: Not on file    Gets together: Not on file    Attends religious service: Not on file    Active member of club or organization: Not on file    Attends meetings of clubs or organizations: Not on file    Relationship status: Not on file  Other Topics Concern  . Not on file  Social History Narrative  . Not on file    His Allergies Are:  Allergies  Allergen Reactions  . Hydrocodone Itching  :   His Current Medications Are:  Outpatient Encounter Medications as of 02/09/2018  Medication Sig  . acetaminophen (TYLENOL) 500 MG tablet Take 1 tablet (500 mg total) by mouth every 6 (six) hours as needed.  Marland Kitchen amLODipine (NORVASC) 5 MG tablet Take 1 tablet (5 mg total) by mouth daily.  Marland Kitchen aspirin EC 81 MG tablet Take 81 mg by mouth daily.  Marland Kitchen esomeprazole (NEXIUM) 40 MG capsule Take 40 mg by mouth daily.  . fenofibrate 160 MG tablet Take 160 mg by mouth daily.  Marland Kitchen levothyroxine (SYNTHROID, LEVOTHROID) 100 MCG tablet Take 100 mcg by mouth daily before breakfast.  . Melatonin 10 MG TABS Take 10 mg by mouth at bedtime.  . mirabegron ER (MYRBETRIQ) 50 MG TB24 tablet Take 50 mg by mouth daily.  . prednisoLONE acetate (PRED FORTE) 1 % ophthalmic suspension 1 drop daily.  . tamsulosin (FLOMAX) 0.4 MG CAPS capsule Take 0.4 mg by mouth daily.  . timolol (BETIMOL) 0.5 % ophthalmic solution Place 1 drop into the right eye 2 (two) times daily.  . [DISCONTINUED] ALPRAZolam (XANAX) 0.5 MG tablet Take 1 tablet (0.5 mg  total) by mouth at bedtime. Take as needed only.  . [DISCONTINUED] dicyclomine (BENTYL) 20 MG tablet Take 1 tablet (20 mg total) by mouth 3 (three) times daily before meals. Take as needed for abdominal cramping (Patient not taking: Reported on 07/29/2017)  . [DISCONTINUED] gabapentin (NEURONTIN) 300 MG capsule Take by mouth.  . [DISCONTINUED] loperamide (IMODIUM) 2 MG capsule Take 1 capsule (2 mg total) by mouth 4 (four) times daily as needed for diarrhea or loose stools.  . [DISCONTINUED] ondansetron (  ZOFRAN ODT) 4 MG disintegrating tablet Take 1 tablet (4 mg total) by mouth every 6 (six) hours as needed for nausea or vomiting.  . [DISCONTINUED] valsartan-hydrochlorothiazide (DIOVAN-HCT) 320-25 MG tablet Take 1 tablet by mouth daily.   No facility-administered encounter medications on file as of 02/09/2018.   :  Review of Systems:  Out of a complete 14 point review of systems, all are reviewed and negative with the exception of these symptoms as listed below: Review of Systems  Neurological:       Pt presents today to discuss his headaches. Pt reports that he has intermittent headaches. Pt does not use his cpap because it causes him trouble sleeping.  Epworth Sleepiness Scale 0= would never doze 1= slight chance of dozing 2= moderate chance of dozing 3= high chance of dozing  Sitting and reading: 2 Watching TV: 3 Sitting inactive in a public place (ex. Theater or meeting): 2 As a passenger in a car for an hour without a break: 0 Lying down to rest in the afternoon: 3 Sitting and talking to someone: 0 Sitting quietly after lunch (no alcohol): 0 In a car, while stopped in traffic: 0 Total: 10     Objective:  Neurological Exam  Physical Exam Physical Examination:   Vitals:   02/09/18 1437  BP: (!) 158/87  Pulse: 85   General Examination: The patient is a very pleasant 84 y.o. male in no acute distress. He appears well-developed and well-nourished and well groomed.   HEENT:  Normocephalic, atraumatic, pupils are unequal, he has a clouded lens on the right side, he has status post corneal transplant, his pupil is nonreactive on the right, he is status post cataract repair on the left, pupil is reactive to light on the left. He has corrective eyeglasses in place. Hearing is mildly impaired. Speech is clear, not dysarthric. Airway examination reveals moderate airway crowding, mild mouth dryness, he has tonsils 1+ bilaterally. He has a redundant soft palate and larger appearing uvula. Mallampati is class II. Neck circumference is 16-1/8 inches. Tongue protrudes centrally and palate elevates symmetrically.   Chest: Clear to auscultation without wheezing, rhonchi or crackles noted.  Heart: S1+S2+0, regular and normal without murmurs, rubs or gallops noted.   Abdomen: Soft, non-tender and non-distended with normal bowel sounds appreciated on auscultation.  Extremities: There is no pitting edema in the distal lower extremities bilaterally.   Skin: Warm and dry without trophic changes noted.  Musculoskeletal: exam reveals no obvious joint deformities, tenderness or joint swelling or erythema, With the exception of low back pain with radiation to the left leg.   Neurologically:  Mental status: The patient is awake, alert and oriented in all 4 spheres. His immediate and remote memory, attention, language skills and fund of knowledge are fairly appropriate. There is no evidence of aphasia, agnosia, apraxia or anomia. Speech is clear with normal prosody and enunciation. Thought process is linear. Mood is normal and affect is normal.  Cranial nerves II - XII are as described above under HEENT exam. In addition: shoulder shrug is normal with equal shoulder height noted. Motor exam: Normal bulk, strength and tone is noted. There is no drift, tremor or rebound. Romberg is negative. Reflexes are 1+ throughout. Fine motor skills and coordination: grossly intact.  Cerebellar testing: No  dysmetria or intention tremor. There is no truncal or gait ataxia.  Sensory exam: intact to light touch in the upper and lower extremities.  Gait, station and balance: He stands with mild difficulty  but needs no assistance. He walks with perhaps a slight limp on the left. No shuffling, preserved arm swing noted. Walks slightly slowly and cautiously. He has no walking aid.   Assessment and Plan:  In summary, [name] is a very pleasant 84 y.o.-year old male with an underlying medical history of recent falls, chronic kidney disease, hypertension, history of prostate cancer, hearing loss, hypothyroidism, thyroid nodules, vision loss, hypogonadism, hyperlipidemia, sleep apnea, reflux disease and mildly overweight state, who presents for evaluation of his recurrent headaches. He has bifrontal headaches which have been ongoing for months, worse after his recent falls in July 2019. He has a nonfocal neurological exam, head CT was done revealing in July 2019 as well. He does have a history of severe sleep apnea which is currently untreated. He may have recurrent headaches including morning headaches secondary to untreated obstructive sleep apnea. He also reports cognitive complaints, which may also try and with the untreated sleep apnea. I would not favor any new medication for headache prevention quite yet. I would like to proceed with sleep study testing to reevaluate his sleep apnea as he has not had a sleep study and nearly 7 years and his machine disorder as well. He would be willing to reconsider CPAP therapy, and consider a different mask. He had some discomfort with his nasal mask in the past.  We talked about the importance of fall prevention. He is encouraged to use his cane for support, he has back pain radiating to the left. I had a long chat with the patient and his daughter about my findings and the diagnosis of OSA, its prognosis and treatment options. I recommended the following at this time:  sleep study with potential positive airway pressure titration. (We will score hypopneas at 4%). I also explained the CPAP treatment option to the patient, who indicated that he would be willing to try CPAP if the need arises. I explained the importance of being compliant with PAP treatment, not only for insurance purposes but primarily to improve His symptoms, and for the patient's long term health benefit, including to reduce His cardiovascular risks. I answered all their questions today and the patient and his daughter were in agreement. I would like to see him back after the sleep study is completed and encouraged him to call with any interim questions, concerns, problems or updates.   Thank you very much for allowing me to participate in the care of this nice patient. If I can be of any further assistance to you please do not hesitate to call me at [phone].  Sincerely,   Star Age, MD, PhD

## 2018-02-09 NOTE — Patient Instructions (Addendum)
Thank you for choosing [facility] for your sleep related care! It was nice to meet you today! I appreciate that you entrust me with your sleep related healthcare concerns. I hope, I was able to address at least some of your concerns today, and that I can help you feel reassured and also get better.    Here is what we discussed today and what we came up with as our plan for you:   Your headaches may be from untreated sleep apnea.    Based on your symptoms and your exam I believe you are still at risk for obstructive sleep apnea and would benefit from reevaluation as it has been many years and you need new supplies and updated machine. Therefore, I think we should proceed with a sleep study to determine how severe your sleep apnea is. If you have more than mild OSA, I want you to consider ongoing treatment with CPAP. Please remember, the risks and ramifications of moderate to severe obstructive sleep apnea or OSA are: Cardiovascular disease, including congestive heart failure, stroke, difficult to control hypertension, arrhythmias, and even type 2 diabetes has been linked to untreated OSA. Sleep apnea causes disruption of sleep and sleep deprivation in most cases, which, in turn, can cause recurrent headaches, problems with memory, mood, concentration, focus, and vigilance. Most people with untreated sleep apnea report excessive daytime sleepiness, which can affect their ability to drive. Please do not drive if you feel sleepy.   I will likely see you back after your sleep study to go over the test results and where to go from there. We will call you after your sleep study to advise about the results (most likely, you will hear from Walton, my nurse) and to set up an appointment at the time, as necessary.    Our sleep lab administrative assistant will call you to schedule your sleep study. If you don't hear back from her by about 2 weeks from now, please feel free to call her at  [phone]. You can leave a message with your phone number and concerns, if you get the voicemail box. She will call back as soon as possible.

## 2018-03-02 ENCOUNTER — Ambulatory Visit (INDEPENDENT_AMBULATORY_CARE_PROVIDER_SITE_OTHER): Payer: 59 | Admitting: Neurology

## 2018-03-02 DIAGNOSIS — R519 Headache, unspecified: Secondary | ICD-10-CM

## 2018-03-02 DIAGNOSIS — R419 Unspecified symptoms and signs involving cognitive functions and awareness: Secondary | ICD-10-CM

## 2018-03-02 DIAGNOSIS — G4733 Obstructive sleep apnea (adult) (pediatric): Secondary | ICD-10-CM | POA: Diagnosis not present

## 2018-03-02 DIAGNOSIS — G4720 Circadian rhythm sleep disorder, unspecified type: Secondary | ICD-10-CM

## 2018-03-02 DIAGNOSIS — E663 Overweight: Secondary | ICD-10-CM

## 2018-03-02 DIAGNOSIS — G4719 Other hypersomnia: Secondary | ICD-10-CM

## 2018-03-02 DIAGNOSIS — Z9181 History of falling: Secondary | ICD-10-CM

## 2018-03-02 DIAGNOSIS — R9431 Abnormal electrocardiogram [ECG] [EKG]: Secondary | ICD-10-CM

## 2018-03-02 DIAGNOSIS — R51 Headache: Secondary | ICD-10-CM

## 2018-03-08 ENCOUNTER — Telehealth: Payer: Self-pay

## 2018-03-08 NOTE — Addendum Note (Signed)
Addended by: Star Age on: 03/08/2018 08:59 AM   Modules accepted: Orders

## 2018-03-08 NOTE — Telephone Encounter (Signed)
-----   Message from Star Age, MD sent at 03/08/2018  8:59 AM EST ----- Patient referred by Dr. Mitchel Honour, seen by me on 02/09/18 for recurrent HAs, split night sleep study on 03/02/18. Please call and notify patient (language barrier, please talk to family member if possible, or use interpreter?) that the recent sleep study confirmed the diagnosis of severe OSA. He did very well with CPAP during the study with significant improvement of the respiratory events. Therefore, I would like start the patient on CPAP therapy at home by prescribing a machine for home use. I placed the order in the chart.  Please advise patient that we need a follow up appointment with either myself or one of our nurse practitioners in about 10 weeks post set-up to check for how the patient is feeling and how well the patient is using the machine, etc. Please go ahead and schedule the appointment, while you have the patient on the phone and make sure patient understands the importance of keeping this window for the FU appointment, as it is often an insurance requirement. Failing to adhere to this may result in losing coverage for sleep apnea treatment, at which point most patients are left with a choice of returning the machine or paying out of pocket (and we want neither of this to happen!).  Please re-enforce the importance of compliance with treatment and the need for [place] to monitor compliance data - again an insurance requirement and usually a good feedback for the patient as far as how they are doing.  Also remind patient, that any PAP machine or mask issues should be first addressed with the DME company, who provided the machine/mask.  Please ask if patient has a preference regarding DME company, may depend on the insurance too.  Please arrange for CPAP set up at home through a DME company of patient's choice.  Once you have spoken to the patient you can close the phone encounter. Please fax/route report to referring provider,  thanks,   Star Age, MD, PhD [facility])

## 2018-03-08 NOTE — Telephone Encounter (Signed)
I called pt, spoke to pt's daughter, Meredith Pel, per DPR. I advised pt's daughter that Dr. Rexene Alberts reviewed their sleep study results and found that pt has severe osa but did well with the cpap during the sleep study. Dr. Rexene Alberts recommends that pt start a cpap at home. I reviewed PAP compliance expectations with the pt. Pt is agreeable to starting a CPAP. I advised pt that an order will be sent to a DME, [facility], and [facility] will call the pt within about one week after they file with the pt's insurance. [facility] will show the pt how to use the machine, fit for masks, and troubleshoot the CPAP if needed. A follow up appt was made for insurance purposes with Hoyle Sauer, NP on Monday, March 9th, 2020 at 9:15am. Pt's daughter verbalized understanding to arrive 15 minutes early and bring their CPAP. A letter with all of this information in it will be mailed to the pt as a reminder. I verified with the pt's daughter that the address we have on file is correct. Pt verbalized understanding of results. Pt's daughter had no questions at this time but was encouraged to call back if questions arise. I have sent the order to [facility] and have received confirmation that they have received the order.

## 2018-03-08 NOTE — Progress Notes (Signed)
Patient referred by Dr. Mitchel Honour, seen by me on 02/09/18 for recurrent HAs, split night sleep study on 03/02/18. Please call and notify patient (language barrier, please talk to family member if possible, or use interpreter?) that the recent sleep study confirmed the diagnosis of severe OSA. He did very well with CPAP during the study with significant improvement of the respiratory events. Therefore, I would like start the patient on CPAP therapy at home by prescribing a machine for home use. I placed the order in the chart.  Please advise patient that we need a follow up appointment with either myself or one of our nurse practitioners in about 10 weeks post set-up to check for how the patient is feeling and how well the patient is using the machine, etc. Please go ahead and schedule the appointment, while you have the patient on the phone and make sure patient understands the importance of keeping this window for the FU appointment, as it is often an insurance requirement. Failing to adhere to this may result in losing coverage for sleep apnea treatment, at which point most patients are left with a choice of returning the machine or paying out of pocket (and we want neither of this to happen!).  Please re-enforce the importance of compliance with treatment and the need for [place] to monitor compliance data - again an insurance requirement and usually a good feedback for the patient as far as how they are doing.  Also remind patient, that any PAP machine or mask issues should be first addressed with the DME company, who provided the machine/mask.  Please ask if patient has a preference regarding DME company, may depend on the insurance too.  Please arrange for CPAP set up at home through a DME company of patient's choice.  Once you have spoken to the patient you can close the phone encounter. Please fax/route report to referring provider, thanks,   Star Age, MD, PhD [facility])

## 2018-03-08 NOTE — Procedures (Signed)
PATIENT'S NAME:  [name], [name] DOB:      1933/01/28      MR#:    [id number]     DATE OF RECORDING: 03/02/2018 REFERRING M.D.:  Agustina Caroli, MD Study Performed:  Split-Night Titration Study HISTORY: 84 year old man with a history of recent falls, chronic kidney disease, hypertension, history of prostate cancer, hearing loss, hypothyroidism, thyroid nodules, vision loss, hypogonadism, hyperlipidemia, prior diagnosis of severe sleep apnea, reflux disease and mildly overweight state, who reports recurrent headaches, including morning headaches. Epworth sleepiness score is 10 out of 24. BMI of 27.5 kg/m2. The patient's neck circumference measured 16.2 inches.  CURRENT MEDICATIONS: Tylenol, Norvasc, Nexium, Synthroid, Melatonin, Myretriq, Predforte, Flomax, Betimol.  PROCEDURE:  This is a multichannel digital polysomnogram utilizing the Somnostar 11.2 system.  Electrodes and sensors were applied and monitored per AASM Specifications.   EEG, EOG, Chin and Limb EMG, were sampled at 200 Hz.  ECG, Snore and Nasal Pressure, Thermal Airflow, Respiratory Effort, CPAP Flow and Pressure, Oximetry was sampled at 50 Hz. Digital video and audio were recorded.      BASELINE STUDY WITHOUT CPAP RESULTS:  Lights Out was at 22:25 and Lights On at 05:04 for the night, split study start at 01:31, epoch 378. Total recording time (TRT) was 184.5, with a total sleep time (TST) of 122.5 minutes.   The patient's sleep latency to persistent sleep was 27.5 minutes.  REM latency was 86.5 minutes, which is normal.  The sleep efficiency was 66.4 %.    SLEEP ARCHITECTURE: WASO (Wake after sleep onset) was 45 minutes, Stage N1 was 6.5 minutes, Stage N2 was 92 minutes, Stage N3 was 17.5 minutes and Stage R (REM sleep) was 6.5 minutes.  The percentages were Stage N1 5.3%, Stage N2 75.1%, Stage N3 14.3% and Stage R (REM sleep) 5.3%. The arousals were noted as: 45 were spontaneous, 0 were associated with PLMs, 41 were associated with  respiratory events.  RESPIRATORY ANALYSIS:  There were a total of 83 respiratory events:  45 obstructive apneas, 13 central apneas and 0 mixed apneas with a total of 58 apneas and an apnea index (AI) of 28.4. There were 25 hypopneas with a hypopnea index of 12.2. The patient also had 0 respiratory event related arousals (RERAs).  Snoring was noted.     The total APNEA/HYPOPNEA INDEX (AHI) was 40.7 /hour and the total RESPIRATORY DISTURBANCE INDEX was 40.7 /hour.  5 events occurred in REM sleep and 57 events in NREM. The REM AHI was 46.2, /hour versus a non-REM AHI of 40.3 /hour. The patient spent 13.5 minutes sleep time in the supine position 293 minutes in non-supine. The supine AHI was 0.0 /hour versus a non-supine AHI of 40.6 /hour.  OXYGEN SATURATION & C02:  The wake baseline 02 saturation was 94%, with the lowest being 75%. Time spent below 89% saturation equaled 17 minutes.  PERIODIC LIMB MOVEMENTS: The patient had a total of 0 Periodic Limb Movements.  The Periodic Limb Movement (PLM) index was 0 /hour and the PLM Arousal index was 0 /hour.  Audio and video analysis did not show any abnormal or unusual movements, behaviors, phonations or vocalizations. The patient took 1 bathroom break. Mild to moderate snoring was noted. The EKG was in keeping with normal sinus rhythm (NSR) with rare PVCs noted.  TITRATION STUDY WITH CPAP RESULTS:   The patient was fitted with a small Simplus FFM. CPAP was initiated at 5 cmH20 with heated humidity per AASM split night standards and pressure was  advanced to 9 cmH20 because of hypopneas, apneas and desaturations.  At a PAP pressure of 9 cmH20, there was a reduction of the AHI to 0.6 /hour with non-supine REM sleep achieved and O2 nadir of 91%.  Total recording time (TRT) was 215 minutes, with a total sleep time (TST) of 183.5 minutes. The patient's sleep latency was 11.5 minutes. REM latency was 47 minutes.  The sleep efficiency was 85.3 %.    SLEEP  ARCHITECTURE: Wake after sleep was 28 minutes, Stage N1 2.5 minutes, Stage N2 41 minutes, Stage N3 52.5 minutes and Stage R (REM sleep) 87.5 minutes. The percentages were: Stage N1 1.4%, Stage N2 22.3%, Stage N3 28.6% and Stage R (REM sleep) 47.7%, which is markedly increased and in keeping with rebound. The arousals were noted as: 18 were spontaneous, 0 were associated with PLMs, 20 were associated with respiratory events.  RESPIRATORY ANALYSIS:  There were a total of 31 respiratory events: 19 obstructive apneas, 9 central apneas and 0 mixed apneas with a total of 28 apneas and an apnea index (AI) of 9.2. There were 3 hypopneas with a hypopnea index of 1. /hour. The patient also had 0 respiratory event related arousals (RERAs).      The total APNEA/HYPOPNEA INDEX  (AHI) was 10.1 /hour and the total RESPIRATORY DISTURBANCE INDEX was 10.1 /hour.  1 events occurred in REM sleep and 30 events in NREM. The REM AHI was .7 /hour versus a non-REM AHI of 18.8 /hour. REM sleep was achieved on a pressure of  cm/h2o (AHI was  .) The patient spent 7% of total sleep time in the supine position. The supine AHI was 62.2 /hour, versus a non-supine AHI of 6.0/hour.  OXYGEN SATURATION & C02:  The wake baseline 02 saturation was 94%, with the lowest being 88%. Time spent below 89% saturation equaled 1 minutes.  PERIODIC LIMB MOVEMENTS: The patient had a total of 0 Periodic Limb Movements. The Periodic Limb Movement (PLM) index was 0 /hour and the PLM Arousal index was 0 /hour.  Post-study, the patient indicated that sleep was better than usual.  POLYSOMNOGRAPHY IMPRESSION :   1. Obstructive Sleep Apnea (OSA)  2. Dysfunctions associated with sleep stages or arousals from sleep 3. Non-specific abnormal EKG  RECOMMENDATIONS:  1. This patient has severe obstructive sleep apnea and responded well on CPAP therapy. I will, therefore, start the patient on home CPAP treatment at a pressure of 9 cm via small FFM, with heated  humidity. The patient should be reminded to be fully compliant with PAP therapy to improve sleep related symptoms and decrease long term cardiovascular risks. Please note that untreated obstructive sleep apnea may carry additional perioperative morbidity. Patients with significant obstructive sleep apnea should receive perioperative PAP therapy and the surgeons and particularly the anesthesiologist should be informed of the diagnosis and the severity of the sleep disordered breathing. 2. This study shows sleep fragmentation and abnormal sleep stage percentages; these are nonspecific findings and per se do not signify an intrinsic sleep disorder or a cause for the patient's sleep-related symptoms. Causes include (but are not limited to) the first night effect of the sleep study, circadian rhythm disturbances, medication effect or an underlying mood disorder or medical problem.  3. The study showed rare PVCs on single lead EKG; clinical correlation is recommended. 4. The patient should be cautioned not to drive, work at heights, or operate dangerous or heavy equipment when tired or sleepy. Review and reiteration of good sleep hygiene measures  should be pursued with any patient. 5. The patient will be seen in follow-up in the sleep clinic at [facility] for discussion of the test results, symptom and treatment compliance review, further management strategies, etc. The referring provider will be notified of the test results.  I certify that I have reviewed the entire raw data recording prior to the issuance of this report in accordance with the Standards of Accreditation of the American Academy of Sleep Medicine (AASM)     Star Age, MD, PhD Diplomat, American Board of Neurology and Sleep Medicine (Neurology and Sleep Medicine)

## 2018-05-03 ENCOUNTER — Encounter: Payer: Self-pay | Admitting: Nurse Practitioner

## 2018-05-31 ENCOUNTER — Encounter: Payer: Self-pay | Admitting: Nurse Practitioner

## 2018-06-05 NOTE — Progress Notes (Addendum)
[facility]  PATIENT: [name] DOB: 1932-11-18   REASON FOR VISIT: Follow-up for newly diagnosed severe obstructive sleep apnea here for initial CPAP HISTORY FROM: Patient and granddaughter who interprets patient does not speak English    HISTORY OF PRESENT ILLNESS:UPDATE 3/9/2020CM [name], 85 year old male returns for follow-up with a history of newly diagnosed severe obstructive sleep apnea.  He is here for CPAP compliance.  He states he still getting the machine.  Compliance data dated 04/02/2018-2 3 shows compliance greater than 4 hours at 59.4%.  Looking at data from 05/03/2020 05/31/2018 shows compliance at 41%.  Patient claims he is still getting used to the machine.  Average usage 4 hours 6 minutes .  Set pressure 9 cm , average AHI 3.8 he returns for reevaluation   11/12/19[name] is an 85 year old right-handed gentleman with an underlying medical history of recent falls, chronic kidney disease, hypertension, history of prostate cancer, hearing loss, hypothyroidism, thyroid nodules, vision loss, hypogonadism, hyperlipidemia, sleep apnea, reflux disease and mildly overweight state, who reports recurrent headaches for several months. They became worse after his falls recently. He has had morning headaches. His headaches are bifrontal and short-lived, lasting sometimes for seconds at a time with a severe sharp sensation. He has no significant neurological accompaniments. He has a history of taking ibuprofen on a regular basis for nearly 10 years but had to stop taking ibuprofen secondary to renal impairment. Lab work from February 2019 was reviewed. He has not been able to use his CPAP. He feels that it causes discomfort. He had sleep study testing originally in 2006 which showed mild to moderate sleep apnea and then in 2012 he was diagnosed with severe sleep apnea with an AHI of 44. He was supposed to have a sleep study last year but could not have it done because  of lack of interpreter at the time. He does not sleep very well. He is tired during the day, Epworth sleepiness score is 10 out of 24. Fatigue score is 39 out of 63. He is sluggish with his speech first thing in the morning. He is married and lives with his wife and 2 of his 4 children. He has 2 other older children from before. He reports difficulty with his short-term memory. He is supposed to have a thyroid nodule biopsy on 02/11/2018. I reviewed your office note from 10/06/2017. You ordered a [place], as reported to recent falls with injury to the head. He fell backwards once outside and hit his head backwards. He also fell inside the home in the bathroom and hit his head to the toilet commode. He had a head CT without contrast on 10/06/2017 and I reviewed the results: IMPRESSION: 1. No acute intracranial abnormality. He is a nonsmoker and does not utilize alcohol, drinks caffeine in the form of coffee, maybe 1 or 2 per day, up to 3 a day on average.   REVIEW OF SYSTEMS: Full 14 system review of systems performed and notable only for those listed, all others are neg:  Constitutional: neg  Cardiovascular: neg Ear/Nose/Throat: neg  Skin: neg Eyes: neg Respiratory: neg Gastroitestinal: neg  Hematology/Lymphatic: neg  Endocrine: neg Musculoskeletal:neg Allergy/Immunology: neg Neurological: neg Psychiatric: neg Sleep : Obstructive sleep apnea with CPAP   ALLERGIES: Allergies  Allergen Reactions  . Hydrocodone Itching    HOME MEDICATIONS: Outpatient Medications Prior to Visit  Medication Sig Dispense Refill  . acetaminophen (TYLENOL) 500 MG tablet Take 1 tablet (500 mg total) by mouth every  6 (six) hours as needed. 30 tablet 0  . amLODipine (NORVASC) 5 MG tablet Take 1 tablet (5 mg total) by mouth daily. 90 tablet 3  . aspirin EC 81 MG tablet Take 81 mg by mouth daily.    Marland Kitchen esomeprazole (NEXIUM) 40 MG capsule Take 40 mg by mouth daily.    . fenofibrate 160 MG tablet Take 160 mg by mouth  daily.    Marland Kitchen levothyroxine (SYNTHROID, LEVOTHROID) 100 MCG tablet Take 100 mcg by mouth daily before breakfast.    . Melatonin 10 MG TABS Take 10 mg by mouth at bedtime.    . mirabegron ER (MYRBETRIQ) 50 MG TB24 tablet Take 50 mg by mouth daily.    . prednisoLONE acetate (PRED FORTE) 1 % ophthalmic suspension 1 drop daily.    . tamsulosin (FLOMAX) 0.4 MG CAPS capsule Take 0.4 mg by mouth daily.    . timolol (BETIMOL) 0.5 % ophthalmic solution Place 1 drop into the right eye 2 (two) times daily.     No facility-administered medications prior to visit.     PAST MEDICAL HISTORY: Past Medical History:  Diagnosis Date  . Anxiety   . Benign pigmented nevus   . Cataract    LEFT EYE  . Depression   . Disc degeneration, lumbar   . Epigastric rebound abdominal tenderness   . Esophageal reflux   . Headache(784.0)   . Hearing loss   . Hemorrhoids   . Hiatal hernia   . Hyperlipidemia   . Hypothyroidism   . Insomnia   . OSA (obstructive sleep apnea)   . Osteoarthritis   . Reflux     PAST SURGICAL HISTORY: Past Surgical History:  Procedure Laterality Date  . BACK SURGERY     L4-L5  . 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 CHOLECYSTECTOMY    . CORNEAL TRANSPLANT     X3 TRANSPLANT AT [facility] CATARACT SURGERY COMPLICATIONS  . EYE SURGERY      FAMILY HISTORY: Family History  Problem Relation Age of Onset  . Cancer Brother   . Brain cancer Sister     SOCIAL HISTORY: Social History   Socioeconomic History  . Marital status: Married    Spouse name: Not on file  . Number of children: Not on file  . Years of education: Not on file  . Highest education level: Not on file  Occupational History  . Occupation: retired    Comment: Therapist, sports in [place]  . Financial resource strain: Not on file  . Food insecurity:    Worry: Not on file    Inability: Not on file  . Transportation needs:    Medical: Not on file     Non-medical: Not on file  Tobacco Use  . Smoking status: Never Smoker  . Smokeless tobacco: Never Used  Substance and Sexual Activity  . Alcohol use: No  . Drug use: No  . Sexual activity: Never  Lifestyle  . Physical activity:    Days per week: Not on file    Minutes per session: Not on file  . Stress: Not on file  Relationships  . Social connections:    Talks on phone: Not on file    Gets together: Not on file    Attends religious service: Not on file    Active member of club or organization: Not on file    Attends meetings of clubs or  organizations: Not on file    Relationship status: Not on file  . Intimate partner violence:    Fear of current or ex partner: Not on file    Emotionally abused: Not on file    Physically abused: Not on file    Forced sexual activity: Not on file  Other Topics Concern  . Not on file  Social History Narrative  . Not on file     PHYSICAL EXAM  Vitals:   06/07/18 0755  BP: 124/73  Pulse: 76  Weight: 164 lb 3.2 oz (74.5 kg)  Height: 5\' 5"  (1.651 m)   Body mass index is 27.32 kg/m.  Generalized: Well developed, in no acute distress  Head: normocephalic and atraumatic,. Oropharynx benign  Neck: Supple, Musculoskeletal: No deformity  Skin no rash or edema Neurological examination   Mentation: Alert oriented to time, place, granddaughter provides the history patient does not speak [place]  Follows all commands speech and language fluent.   Cranial nerve II-XII: Pupils were equal round reactive to light extraocular movements were full, visual field were full on confrontational test. Facial sensation and strength were normal. hearing was intact to finger rubbing bilaterally. Uvula tongue midline. head turning and shoulder shrug were normal and symmetric.Tongue protrusion into cheek strength was normal. Motor: normal bulk and tone, full strength in the BUE, BLE,  Sensory: normal and symmetric to light touch,  Coordination:  finger-nose-finger, no dysmetria Gait and Station: Rising up from seated position without assistance, normal stance,  moderate stride, good arm swing, smooth turning, able to perform tiptoe, and heel walking without difficulty. Tandem gait is unsteady  DIAGNOSTIC DATA (LABS, IMAGING, TESTING) - I reviewed patient records, labs, notes, testing and imaging myself where available.  Lab Results  Component Value Date   WBC 4.7 07/29/2017   HGB 15.2 07/29/2017   HCT 45.8 07/29/2017   MCV 89.3 07/29/2017   PLT 239 05/22/2017      Component Value Date/Time   NA 142 05/22/2017 2027   NA 139 04/10/2017 1103   K 3.9 05/22/2017 2027   CL 113 (H) 05/22/2017 2027   CO2 18 (L) 05/22/2017 2027   GLUCOSE 102 (H) 05/22/2017 2027   BUN 33 (H) 05/22/2017 2027   BUN 17 04/10/2017 1103   CREATININE 1.45 (H) 05/22/2017 2027   CALCIUM 9.6 05/22/2017 2027   PROT 8.0 05/22/2017 2027   ALBUMIN 4.6 05/22/2017 2027   AST 44 (H) 05/22/2017 2027   ALT 41 05/22/2017 2027   ALKPHOS 51 05/22/2017 2027   BILITOT 0.8 05/22/2017 2027   GFRNONAA 43 (L) 05/22/2017 2027   GFRAA 50 (L) 05/22/2017 2027   Lab Results  Component Value Date   CHOL 184 05/06/2010   HDL 34 (L) 05/06/2010   LDLCALC 104 (H) 05/06/2010   TRIG 228 (H) 05/06/2010   CHOLHDL 5.4 Ratio 05/06/2010   Lab Results  Component Value Date   HGBA1C 5.0 03/29/2017    Lab Results  Component Value Date   TSH 0.800 03/29/2017     ASSESSMENT AND PLAN  Khyron Garno is a very pleasant 85 y.o.-year old male with an underlying medical history of falls, chronic kidney disease, hypertension, history of prostate cancer, hearing loss, hypothyroidism, thyroid nodules, vision loss, hypogonadism, hyperlipidemia, sleep apnea, reflux disease and mildly overweight state, who presents for evaluation of his recurrent headaches. He has bifrontal headaches which have been ongoing for months, worse after his recent falls in July 2019. He has a nonfocal  neurological exam,  head CT was done revealing in July 2019 as well. He does have a history of severe sleep apnea which is currently untreated.  Repeat sleep study on 03/02/2018 shows severe obstructive sleep apnea he was placed on CPAP.  Compliance data for the month of January at 59.4%.  Compliance data for the month of February at 41.4%.  Average usage 4 hours 6 minutes.  Set pressure 9 cm average AHI 3.8.   CPAP compliance greater than 4 hours at 41.4 % for the past month, 59.4% for the month of January.  Reviewed data with patient and granddaughter for interpretation Need to use the machine longer each day Continue same settings Follow-up in 3 months for repeat compliance Dennie Bible, [facility], [facility], APRN  [facility] [address], [place], [place] [address] [phone]  I reviewed the above note and documentation by the Nurse Practitioner and agree with the history, physical exam, assessment and plan as outlined above. I was immediately available for face-to-face consultation. Star Age, MD, PhD [facility])

## 2018-06-07 ENCOUNTER — Ambulatory Visit (INDEPENDENT_AMBULATORY_CARE_PROVIDER_SITE_OTHER): Payer: 59 | Admitting: Nurse Practitioner

## 2018-06-07 ENCOUNTER — Encounter: Payer: Self-pay | Admitting: Nurse Practitioner

## 2018-06-07 DIAGNOSIS — Z9989 Dependence on other enabling machines and devices: Secondary | ICD-10-CM | POA: Diagnosis not present

## 2018-06-07 DIAGNOSIS — G4733 Obstructive sleep apnea (adult) (pediatric): Secondary | ICD-10-CM | POA: Diagnosis not present

## 2018-06-07 NOTE — Patient Instructions (Signed)
CPAP compliance greater than 4 hours 59.4% Need to use the machine longer each day Continue same settings Follow-up in 4 months for repeat compliance

## 2018-08-30 ENCOUNTER — Encounter: Payer: Self-pay | Admitting: Neurology

## 2018-09-06 ENCOUNTER — Telehealth: Payer: Self-pay | Admitting: Neurology

## 2018-09-06 NOTE — Telephone Encounter (Signed)
Due to current COVID 19 pandemic, our office is severely reducing in office visits until further notice, in order to minimize the risk to our patients and healthcare providers.   Called patient and spoke with granddaughter Lattie Haw at number provided in chart. I offered patient a virtual visit for 6/11 appt which he declined. Pt stated he would like to come in office. Patient's granddaughter will communicate the precautions we are taking for in office visits. I advised that upon arrival patient and translator will need to park car next to entrance and wait for assistance from staff member.

## 2018-09-09 ENCOUNTER — Ambulatory Visit (INDEPENDENT_AMBULATORY_CARE_PROVIDER_SITE_OTHER): Payer: 59 | Admitting: Neurology

## 2018-09-09 ENCOUNTER — Other Ambulatory Visit: Payer: Self-pay

## 2018-09-09 ENCOUNTER — Encounter: Payer: Self-pay | Admitting: Neurology

## 2018-09-09 VITALS — BP 122/79 | HR 91 | Ht 65.0 in | Wt 165.0 lb

## 2018-09-09 DIAGNOSIS — R51 Headache: Secondary | ICD-10-CM

## 2018-09-09 DIAGNOSIS — Z789 Other specified health status: Secondary | ICD-10-CM

## 2018-09-09 DIAGNOSIS — G4733 Obstructive sleep apnea (adult) (pediatric): Secondary | ICD-10-CM

## 2018-09-09 DIAGNOSIS — R519 Headache, unspecified: Secondary | ICD-10-CM

## 2018-09-09 DIAGNOSIS — Z9989 Dependence on other enabling machines and devices: Secondary | ICD-10-CM

## 2018-09-09 MED ORDER — GABAPENTIN 100 MG PO CAPS: 100.0000 mg | ORAL_CAPSULE | Freq: Every evening | ORAL | 3 refills | Status: DC | PRN

## 2018-09-09 NOTE — Patient Instructions (Signed)
I appreciate you using CPAP despite your difficulty, please try to Continue with it.  We want you to be able to sleep with it as you do have severe obstructive sleep apnea which should be treated.  We will try gabapentin to see if we can help you sleep a little better and not wake up with these headaches.  Your examination and last years CT scan are benign thankfully. Please follow-up in 3 months with 1 of our nurse practitioners.

## 2018-09-09 NOTE — Progress Notes (Signed)
Subjective:    Patient ID: Jansel Vonstein is a 85 y.o. male.  HPI     Interim history:  Mr. Olguin is an 85 year old right-handed gentleman with an underlying medical history of recent falls, chronic kidney disease, hypertension, history of prostate cancer, hearing loss, hypothyroidism, thyroid nodules, vision loss, hypogonadism, hyperlipidemia, sleep apnea, reflux disease and mildly overweight state, who presents for follow-up consultation of his severe obstructive sleep apnea and recurrent headaches.  The patient is accompanied by a Spanish interpreter today.  I first met him on 02/09/2018 at the request of his primary care physician, at which time the patient reported a several month history of recurrent headaches including morning headaches and he also carried a prior diagnosis of severe obstructive sleep apnea but was not on treatment for this.  He was advised to proceed with a sleep study.  He had a split-night sleep study on 03/02/2018 which showed severe obstructive sleep apnea with an AHI of 40.7/h, REM AHI of 46.2/h, O2 nadir of 75%.  He was titrated on CPAP from 5 cm to 9 cm.  On the final pressure his AHI was 0.6/h with nonsupine REM sleep achieved an O2 nadir of 91%.   He saw Cecille Rubin, nurse practitioner, in the interim on 06/07/2018, at which time he was still adjusting to CPAP therapy and somewhat suboptimal with his overall compliance for more than 4 hours. He was advised to but he was motivated to continue  Today, 09/09/2018: I reviewed his CPAP compliance data from 08/01/2018 through 08/30/2018 which is a total of 30 days, during which time he used his machine every night with percent used days greater than 4 hours at 80%, indicating very good compliance with an average usage of 5 hours and 3 minutes, residual AHI borderline at 6/h, pressure of 9 cm, leak fluctuates.  He says that in the past 8 days he has not been able to use his machine because he had dental extractions.  He will get  dentures.  He does have discomfort with the mask and the pressure and the mask he currently has is better than what he used in the past.  Nevertheless, he does not actually sleep very well with the CPAP and indicates that the little sleep he gets is actually without treatment.  He takes melatonin and Benadryl.  I asked him if he had ever been on gabapentin.  He recalls that he was on gabapentin 300 mg and a muscle relaxer for back pain and he was told to stop them because of mouth dryness.  He thought he slept better when he was on gabapentin and that the mouth dryness actually came from the muscle relaxer.  He also indicates that he sees a different primary care physician now.  He reports that his headaches are intermittent, they do come at night often one side or the other and they seem sharp and stabbing.  He has had ringing in the ears and also does not hear very well.  He has never seen an ear nose throat specialist.  He has seen Dr. Yong Channel through [facility], new internist.    The patient's allergies, current medications, family history, past medical history, past social history, past surgical history and problem list were reviewed and updated as appropriate.    Previously:  02/09/2018: (He) reports recurrent headaches for several months. They became worse after his falls recently. He has had morning headaches. His headaches are bifrontal and short-lived, lasting sometimes for seconds at a time  with a severe sharp sensation. He has no significant neurological accompaniments. He has a history of taking ibuprofen on a regular basis for nearly 10 years but had to stop taking ibuprofen secondary to renal impairment. Lab work from February 2019 was reviewed. He has not been able to use his CPAP. He feels that it causes discomfort. He had sleep study testing originally in 2006 which showed mild to moderate sleep apnea and then in 2012 he was diagnosed with severe sleep apnea with an AHI of 44. He was supposed  to have a sleep study last year but could not have it done because of lack of interpreter at the time. He does not sleep very well. He is tired during the day, Epworth sleepiness score is 10 out of 24. Fatigue score is 39 out of 63. He is sluggish with his speech first thing in the morning. He is married and lives with his wife and 2 of his 4 children. He has 2 other older children from before. He reports difficulty with his short-term memory. He is supposed to have a thyroid nodule biopsy on 02/11/2018. I reviewed your office note from 10/06/2017. You ordered a [place], as reported to recent falls with injury to the head. He fell backwards once outside and hit his head backwards. He also fell inside the home in the bathroom and hit his head to the toilet commode. He had a head CT without contrast on 10/06/2017 and I reviewed the results: IMPRESSION: 1. No acute intracranial abnormality. He is a nonsmoker and does not utilize alcohol, drinks caffeine in the form of coffee, maybe 1 or 2 per day, up to 3 a day on average.   His Past Medical History Is Significant For: Past Medical History:  Diagnosis Date  . Anxiety   . Benign pigmented nevus   . Cataract    LEFT EYE  . Depression   . Disc degeneration, lumbar   . Epigastric rebound abdominal tenderness   . Esophageal reflux   . Headache(784.0)   . Hearing loss   . Hemorrhoids   . Hiatal hernia   . Hyperlipidemia   . Hypothyroidism   . Insomnia   . OSA (obstructive sleep apnea)   . Osteoarthritis   . Reflux     His Past Surgical History Is Significant For: Past Surgical History:  Procedure Laterality Date  . BACK SURGERY     L4-L5  . 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 CHOLECYSTECTOMY    . CORNEAL TRANSPLANT     X3 TRANSPLANT AT [facility] CATARACT SURGERY COMPLICATIONS  . EYE SURGERY      His Family History Is Significant For: Family History  Problem Relation Age of Onset  .  Cancer Brother   . Brain cancer Sister     His Social History Is Significant For: Social History   Socioeconomic History  . Marital status: Married    Spouse name: Not on file  . Number of children: Not on file  . Years of education: Not on file  . Highest education level: Not on file  Occupational History  . Occupation: retired    Comment: Therapist, sports in [place]  . Financial resource strain: Not on file  . Food insecurity    Worry: Not on file    Inability: Not on file  . Transportation needs    Medical: Not on file  Non-medical: Not on file  Tobacco Use  . Smoking status: Never Smoker  . Smokeless tobacco: Never Used  Substance and Sexual Activity  . Alcohol use: No  . Drug use: No  . Sexual activity: Never  Lifestyle  . Physical activity    Days per week: Not on file    Minutes per session: Not on file  . Stress: Not on file  Relationships  . Social Herbalist on phone: Not on file    Gets together: Not on file    Attends religious service: Not on file    Active member of club or organization: Not on file    Attends meetings of clubs or organizations: Not on file    Relationship status: Not on file  Other Topics Concern  . Not on file  Social History Narrative  . Not on file    His Allergies Are:  Allergies  Allergen Reactions  . Hydrocodone Itching  :   His Current Medications Are:  Outpatient Encounter Medications as of 09/09/2018  Medication Sig  . acetaminophen (TYLENOL) 500 MG tablet Take 1 tablet (500 mg total) by mouth every 6 (six) hours as needed.  Marland Kitchen amLODipine (NORVASC) 5 MG tablet Take 1 tablet (5 mg total) by mouth daily.  Marland Kitchen aspirin EC 81 MG tablet Take 81 mg by mouth daily.  Marland Kitchen esomeprazole (NEXIUM) 40 MG capsule Take 40 mg by mouth daily.  . fenofibrate 160 MG tablet Take 160 mg by mouth daily.  Marland Kitchen levothyroxine (SYNTHROID, LEVOTHROID) 100 MCG tablet Take 100 mcg by mouth daily before breakfast.  . Melatonin 10 MG TABS  Take 10 mg by mouth at bedtime.  . mirabegron ER (MYRBETRIQ) 50 MG TB24 tablet Take 50 mg by mouth daily.  . prednisoLONE acetate (PRED FORTE) 1 % ophthalmic suspension 1 drop daily.  . tamsulosin (FLOMAX) 0.4 MG CAPS capsule Take 0.4 mg by mouth daily.  . timolol (BETIMOL) 0.5 % ophthalmic solution Place 1 drop into the right eye 2 (two) times daily.   No facility-administered encounter medications on file as of 09/09/2018.   :  Review of Systems:  Out of a complete 14 point review of systems, all are reviewed and negative with the exception of these symptoms as listed below:      Review of Systems  Neurological:       Pt presents today to discuss his cpap, sleep, and headaches. Pt reports that his headaches are still bothering him.    Objective:  Neurological Exam  Physical Exam Physical Examination:   Vitals:   09/09/18 1405  BP: 122/79  Pulse: 91   General Examination: The patient is a very pleasant 85 y.o. male in no acute distress. He appears well-developed and well-nourished and well groomed.   HEENT: Normocephalic, atraumatic, pupils are unequal, he has a clouded lens on the right side, he has status post corneal transplant, his pupil is nonreactive on the right, he is status post cataract repair on the left, pupil is reactive to light on the left. He has corrective eyeglasses in place. Hearing is mildly impaired. Tympanic membranes are clear, he has mild cerumen on the left more than right but not impacted. Speech is clear, not dysarthric. Airway examination reveals moderate airway crowding, moderate mouth dryness, he is Edentulous, healing lower gums in the front, recent dental extractions Tongue protrudes centrally and palate elevates symmetrically.   Chest: Clear to auscultation without wheezing, rhonchi or crackles noted.  Heart: S1+S2+0, regular  and normal without murmurs, rubs or gallops noted.   Abdomen: Soft, non-tender and non-distended with normal bowel  sounds appreciated on auscultation.  Extremities: There is no pitting edema in the distal lower extremities bilaterally.   Skin: Warm and dry without trophic changes noted.  Musculoskeletal: exam reveals no obvious joint deformities, tenderness or joint swelling or erythema.  Neurologically:  Mental status: The patient is awake, alert and oriented in all 4 spheres. His immediate and remote memory, attention, language skills and fund of knowledge are fairly appropriate. There is no evidence of aphasia, agnosia, apraxia or anomia. Speech is clear with normal prosody and enunciation. Thought process is linear. Mood is normal and affect is normal.  Cranial nerves II - XII are as described above under HEENT exam. In addition: shoulder shrug is normal with equal shoulder height noted. Motor exam: Normal bulk, strength and tone is noted. There is no drift, tremor or rebound. Romberg is negative. Reflexes are 1+ throughout. Fine motor skills and coordination: grossly intact.  Cerebellar testing: No dysmetria or intention tremor. There is no truncal or gait ataxia.  Sensory exam: intact to light touch in the upper and lower extremities.  Gait, station and balance: He stands with mild difficulty but needs no assistance. He walks with perhaps a slight limp on the left. No shuffling, preserved arm swing noted. Walks slightly slowly and cautiously. He has no walking aid.   Assessment and Plan:  In summary, Dyllen Menning is a very pleasant 85 year old male with an underlying medical history of recent falls, chronic kidney disease, hypertension, history of prostate cancer, hearing loss, hypothyroidism, thyroid nodules, vision loss, hypogonadism, hyperlipidemia, sleep apnea, reflux disease and mildly overweight state, who presents for Follow-up consultation of his severe sleep apnea after his split-night sleep study from 03/02/2018 and his recurrent headaches.  His headaches are about the same and he indicates  that while he uses CPAP he does not actually sleep very much with it.  He has been taking melatonin and some Benadryl.  He has in the past tried gabapentin for back pain.  He does recall that it helped him sleep a little better.  I suggested for his recurrent headaches which are probably in part related to untreated severe obstructive sleep apnea and in order to have him obtain a little more restful sleep we could try gabapentin and low-dose at 100 mg each night as needed.  He is willing to try it.  He is encouraged to continue with CPAP, he is actually compliant with it but unfortunately is not sleeping very much with it.  He is willing to continue with CPAP therapy.  Exam is nonfocal and he had a head CT last year which was nonrevealing.  I suggested a 62-monthfollow-up with 1 of our nurse practitioners.  He was provided with a new prescription and written instructions.  He was furthermore advised to see ENT regarding his hearing loss and ringing in the ears.  I answered all his questions today and the patient was in agreement. I spent 25 minutes in total face-to-face time with the patient, more than 50% of which was spent in counseling and coordination of care, reviewing test results, reviewing medication and discussing or reviewing the diagnosis of OSA, rec. HAs, the prognosis and treatment options. Pertinent laboratory and imaging test results that were available during this visit with the patient were reviewed by me and considered in my medical decision making (see chart for details).

## 2018-09-09 NOTE — Progress Notes (Deleted)
Subjective:    Patient ID: [name] is a 85 y.o. male.  HPI {Common ambulatory SmartLinks:[id number]}  Review of Systems  Objective:  Neurological Exam  Physical Exam  Assessment:   ***  Plan:   ***

## 2018-12-15 ENCOUNTER — Other Ambulatory Visit: Payer: Self-pay

## 2018-12-15 ENCOUNTER — Encounter: Payer: Self-pay | Admitting: Neurology

## 2018-12-15 ENCOUNTER — Ambulatory Visit (INDEPENDENT_AMBULATORY_CARE_PROVIDER_SITE_OTHER): Payer: 59 | Admitting: Neurology

## 2018-12-15 VITALS — BP 137/79 | HR 77 | Ht 65.0 in | Wt 166.0 lb

## 2018-12-15 DIAGNOSIS — Z9989 Dependence on other enabling machines and devices: Secondary | ICD-10-CM | POA: Diagnosis not present

## 2018-12-15 DIAGNOSIS — R51 Headache: Secondary | ICD-10-CM

## 2018-12-15 DIAGNOSIS — G4733 Obstructive sleep apnea (adult) (pediatric): Secondary | ICD-10-CM

## 2018-12-15 DIAGNOSIS — R519 Headache, unspecified: Secondary | ICD-10-CM

## 2018-12-15 MED ORDER — GABAPENTIN 100 MG PO CAPS: 100.0000 mg | ORAL_CAPSULE | Freq: Every evening | ORAL | 3 refills | Status: DC | PRN

## 2018-12-15 NOTE — Progress Notes (Signed)
Subjective:    Patient ID: [name] is a 85 y.o. male.  HPI     Interim history:   [name] is an 85 year old right-handed gentleman with an underlying medical history of recent falls, chronic kidney disease, hypertension, history of prostate cancer, hearing loss, hypothyroidism, thyroid nodules, vision loss, hypogonadism, hyperlipidemia, sleep apnea, reflux disease and mildly overweight state, who presents for follow-up consultation of his severe obstructive sleep apnea and recurrent headaches.  The patient is accompanied by a [place] interpreter, [name], today.  I last saw him on 09/09/2018, at which time he was compliant with his CPAP.  He continued to have recurrent headaches.  He had recent teeth extractions during which time he was not able to use his CPAP but other than that he was compliant.  I suggested a trial of gabapentin for headache management.  He was advised to continue with CPAP therapy.    Today, 12/15/2018: I reviewed his CPAP compliance data from 09/15/2018 through 12/13/2018 which is the last 90 days, during which time he used his machine only 16 days with percent use days greater than 4 hours at 2.2% only, indicating very low compliance.  In the past 30 days he used his machine only 1 day.  Set up date was 03/26/2018.  He has recently restarted using his machine for the past 2 days.  He reports that he had difficulty using his CPAP secondary to teeth extractions and getting new dentures.  He got new dentures some 2 days ago and has in fact restarted using his CPAP.  He is still struggling to get used to it again.  He admits that when he is able to sleep with it even for a few hours he actually feels better.  When he uses the machine, sometimes the pressure bothers him and sometimes it seems not enough.  He is using a fullface mask.  He takes his dentures out at night, he would be willing to try a different style of mask and a lower pressure even.  He is quite pleased with his  headache management.  He has not had any serious headaches or recurrence of significant headaches and has been successfully taking low-dose gabapentin at night 100 mg strength and sometimes takes melatonin at night.  He indicates no side effects with the gabapentin.  He is motivated to continue with the CPAP and would be willing to try a different style of mask.    The patient's allergies, current medications, family history, past medical history, past social history, past surgical history and problem list were reviewed and updated as appropriate.   Previously:  I first met him on 02/09/2018 at the request of his primary care physician, at which time the patient reported a several month history of recurrent headaches including morning headaches and he also carried a prior diagnosis of severe obstructive sleep apnea but was not on treatment for this.  He was advised to proceed with a sleep study.  He had a split-night sleep study on 03/02/2018 which showed severe obstructive sleep apnea with an AHI of 40.7/h, REM AHI of 46.2/h, O2 nadir of 75%.  He was titrated on CPAP from 5 cm to 9 cm.  On the final pressure his AHI was 0.6/h with nonsupine REM sleep achieved an O2 nadir of 91%.   He saw [name], nurse practitioner, in the interim on 06/07/2018, at which time he was still adjusting to CPAP therapy and somewhat suboptimal with his overall compliance for more than  4 hours. He was advised to but he was motivated to continue  I reviewed his CPAP compliance data from 08/01/2018 through 08/30/2018 which is a total of 30 days, during which time he used his machine every night with percent used days greater than 4 hours at 80%, indicating very good compliance with an average usage of 5 hours and 3 minutes, residual AHI borderline at 6/h, pressure of 9 cm, leak fluctuates.   02/09/2018: (He) reports recurrent headaches for several months. They became worse after his falls recently. He has had morning headaches.  His headaches are bifrontal and short-lived, lasting sometimes for seconds at a time with a severe sharp sensation. He has no significant neurological accompaniments. He has a history of taking ibuprofen on a regular basis for nearly 10 years but had to stop taking ibuprofen secondary to renal impairment. Lab work from February 2019 was reviewed. He has not been able to use his CPAP. He feels that it causes discomfort. He had sleep study testing originally in 2006 which showed mild to moderate sleep apnea and then in 2012 he was diagnosed with severe sleep apnea with an AHI of 44. He was supposed to have a sleep study last year but could not have it done because of lack of interpreter at the time. He does not sleep very well. He is tired during the day, Epworth sleepiness score is 10 out of 24. Fatigue score is 39 out of 63. He is sluggish with his speech first thing in the morning. He is married and lives with his wife and 2 of his 4 children. He has 2 other older children from before. He reports difficulty with his short-term memory. He is supposed to have a thyroid nodule biopsy on 02/11/2018. I reviewed your office note from 10/06/2017. You ordered a Silo, as reported to recent falls with injury to the head. He fell backwards once outside and hit his head backwards. He also fell inside the home in the bathroom and hit his head to the toilet commode. He had a head CT without contrast on 10/06/2017 and I reviewed the results: IMPRESSION: 1. No acute intracranial abnormality. He is a nonsmoker and does not utilize alcohol, drinks caffeine in the form of coffee, maybe 1 or 2 per day, up to 3 a day on average.   His Past Medical History Is Significant For: Past Medical History:  Diagnosis Date  . Anxiety   . Benign pigmented nevus   . Cataract    LEFT EYE  . Depression   . Disc degeneration, lumbar   . Epigastric rebound abdominal tenderness   . Esophageal reflux   . Headache(784.0)   . Hearing loss    . Hemorrhoids   . Hiatal hernia   . Hyperlipidemia   . Hypothyroidism   . Insomnia   . OSA (obstructive sleep apnea)   . Osteoarthritis   . Reflux     His Past Surgical History Is Significant For: Past Surgical History:  Procedure Laterality Date  . BACK SURGERY     L4-L5  . 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 CHOLECYSTECTOMY    . CORNEAL TRANSPLANT     X3 TRANSPLANT AT [facility] CATARACT SURGERY COMPLICATIONS  . EYE SURGERY      His Family History Is Significant For: Family History  Problem Relation Age of Onset  . Cancer Brother   . Brain cancer Sister  His Social History Is Significant For: Social History   Socioeconomic History  . Marital status: Married    Spouse name: Not on file  . Number of children: Not on file  . Years of education: Not on file  . Highest education level: Not on file  Occupational History  . Occupation: retired    Comment: Therapist, sports in [place]  . Financial resource strain: Not on file  . Food insecurity    Worry: Not on file    Inability: Not on file  . Transportation needs    Medical: Not on file    Non-medical: Not on file  Tobacco Use  . Smoking status: Never Smoker  . Smokeless tobacco: Never Used  Substance and Sexual Activity  . Alcohol use: No  . Drug use: No  . Sexual activity: Never  Lifestyle  . Physical activity    Days per week: Not on file    Minutes per session: Not on file  . Stress: Not on file  Relationships  . Social Herbalist on phone: Not on file    Gets together: Not on file    Attends religious service: Not on file    Active member of club or organization: Not on file    Attends meetings of clubs or organizations: Not on file    Relationship status: Not on file  Other Topics Concern  . Not on file  Social History Narrative  . Not on file    His Allergies Are:  Allergies  Allergen Reactions  . Hydrocodone Itching   :   His Current Medications Are:  Outpatient Encounter Medications as of 12/15/2018  Medication Sig  . acetaminophen (TYLENOL) 500 MG tablet Take 1 tablet (500 mg total) by mouth every 6 (six) hours as needed.  Marland Kitchen amLODipine (NORVASC) 5 MG tablet Take 1 tablet (5 mg total) by mouth daily.  Marland Kitchen aspirin EC 81 MG tablet Take 81 mg by mouth daily.  Marland Kitchen esomeprazole (NEXIUM) 40 MG capsule Take 40 mg by mouth daily.  . fenofibrate 160 MG tablet Take 160 mg by mouth daily.  Marland Kitchen gabapentin (NEURONTIN) 100 MG capsule Take 1 capsule (100 mg total) by mouth at bedtime as needed.  Marland Kitchen levothyroxine (SYNTHROID, LEVOTHROID) 100 MCG tablet Take 100 mcg by mouth daily before breakfast.  . Melatonin 10 MG TABS Take 10 mg by mouth at bedtime.  . mirabegron ER (MYRBETRIQ) 50 MG TB24 tablet Take 50 mg by mouth daily.  . prednisoLONE acetate (PRED FORTE) 1 % ophthalmic suspension 1 drop daily.  . tamsulosin (FLOMAX) 0.4 MG CAPS capsule Take 0.4 mg by mouth daily.  . timolol (BETIMOL) 0.5 % ophthalmic solution Place 1 drop into the right eye 2 (two) times daily.  . [DISCONTINUED] gabapentin (NEURONTIN) 100 MG capsule Take 1 capsule (100 mg total) by mouth at bedtime as needed.   No facility-administered encounter medications on file as of 12/15/2018.   :  Review of Systems:  Out of a complete 14 point review of systems, all are reviewed and negative with the exception of these symptoms as listed below:  Review of Systems  Neurological:       Pt presents today to discuss her cpap. He has been having mouth pain for his dentures and has been unable to use his cpap for a few days. Otherwise, it is going well.    Objective:  Neurological Exam  Physical Exam Physical Examination:   Vitals:   12/15/18  1305  BP: 137/79  Pulse: 77   General Examination: The patient is a very pleasant 85 y.o. male in no acute distress. He appears well-developed and well-nourished and well groomed. Good  spirits.  HEENT:Normocephalic, atraumatic, pupils are unequal, he has a clouded lens on the right side, he has status post corneal transplant, his pupil is nonreactive on the right, he is status post cataract repair on the left, pupil is reactive to light on the left. He has corrective eyeglasses in place. Hearing is mildly impaired. Speech is clear, not dysarthric. Airway examination reveals moderate airway crowding, mild mouth dryness, full dentures. Tongue protrudes centrally and palate elevates symmetrically.   Chest:Clear to auscultation without wheezing, rhonchi or crackles noted.  Heart:S1+S2+0, regular and normal without murmurs, rubs or gallops noted.   Abdomen:Soft, non-tender and non-distended with normal bowel sounds appreciated on auscultation.  Extremities:There isnopitting edema in the distal lower extremities bilaterally.   Skin: Warm and dry without trophic changes noted.  Musculoskeletal: exam reveals no obvious joint deformities, tenderness or joint swelling or erythema.  Neurologically:  Mental status: The patient is awake, alert and oriented in all 4 spheres.Hisimmediate and remote memory, attention, language skills and fund of knowledge are fairlyappropriate. There is no evidence of aphasia, agnosia, apraxia or anomia. Speech is clear with normal prosody and enunciation. Thought process is linear. Mood isnormaland affect is normal.  Cranial nerves II - XII are as described above under HEENT exam.  Motor exam: Normal bulk, strength and tone is noted. There is no tremor. Fine motor skills and coordination:grosslyintact.  Cerebellar testing: No dysmetria or intention tremor. There is no truncal or gait ataxia.  Sensory exam: intact to light touch in the upper and lower extremities.  Gait, station and balance:Hestands with mild difficulty but needs no assistance. He walks with perhaps a slight limp on the left. No shuffling, preserved arm swing noted.  Walks slightly slowly and cautiously. He has no walking aid.  Assessmentand Plan:  In summary,Shravan Cortesis a very pleasant 29 year oldmalewith an underlying medical history of recent falls, chronic kidney disease, hypertension, history of prostate cancer, hearing loss, hypothyroidism, thyroid nodules, vision loss, hypogonadism, hyperlipidemia, sleep apnea, reflux disease and mildly overweight state, whopresents for Follow-up consultation of his severe sleep apnea. He had a split-night sleep study on 03/02/2018. He has had difficulty with compliance, interim difficulty secondary to teeth extractions and then recently got fitted with new dentures.  He has had recurrent headaches and thankfully his headaches have improved with a small dose of gabapentin on a daily basis.  He can continue with gabapentin 100 mg each night, I renewed his prescription for 90 days with refills.  His exam is otherwise stable.  He is motivated to try CPAP, I suggested we would lower his pressure for better tolerance and comfort to 8 cm and also try perhaps a nasal cradle interface rather than a full facemask which may be putting too much pressure on his gums. He is also advised that we could try a longer ramp up time of 20 minutes rather than 10 minutes.  He is willing to trial these little changes and motivated to try to continue with treatment as he has noted some benefit with the CPAP.  He is advised to follow-up with 1 of our nurse practitioners in 3 months, sooner if needed.  I answered all his questions today and he was in agreement.

## 2018-12-15 NOTE — Patient Instructions (Signed)
As discussed, we will reduce your pressure to 8 cm on your CPAP machine.  We will increase your ramp up time to 20 minutes from 10 minutes.  We will have you try a different style of nasal mask called nasal cradle, [facility] will be in touch with you regarding a new mask and the pressure change.  They can do the pressure changes remotely most likely.  We will have you follow-up in 3 months with a nurse practitioner, you can continue with gabapentin 100 mg at bedtime, I renewed your prescription.

## 2019-01-01 IMAGING — DX DG CHEST 2V
2 series · 3 of 3 positions shown · non-contrast
Comparison: None.

CLINICAL DATA: Cough.

EXAM:
CHEST - 2 VIEW

[chest pa]
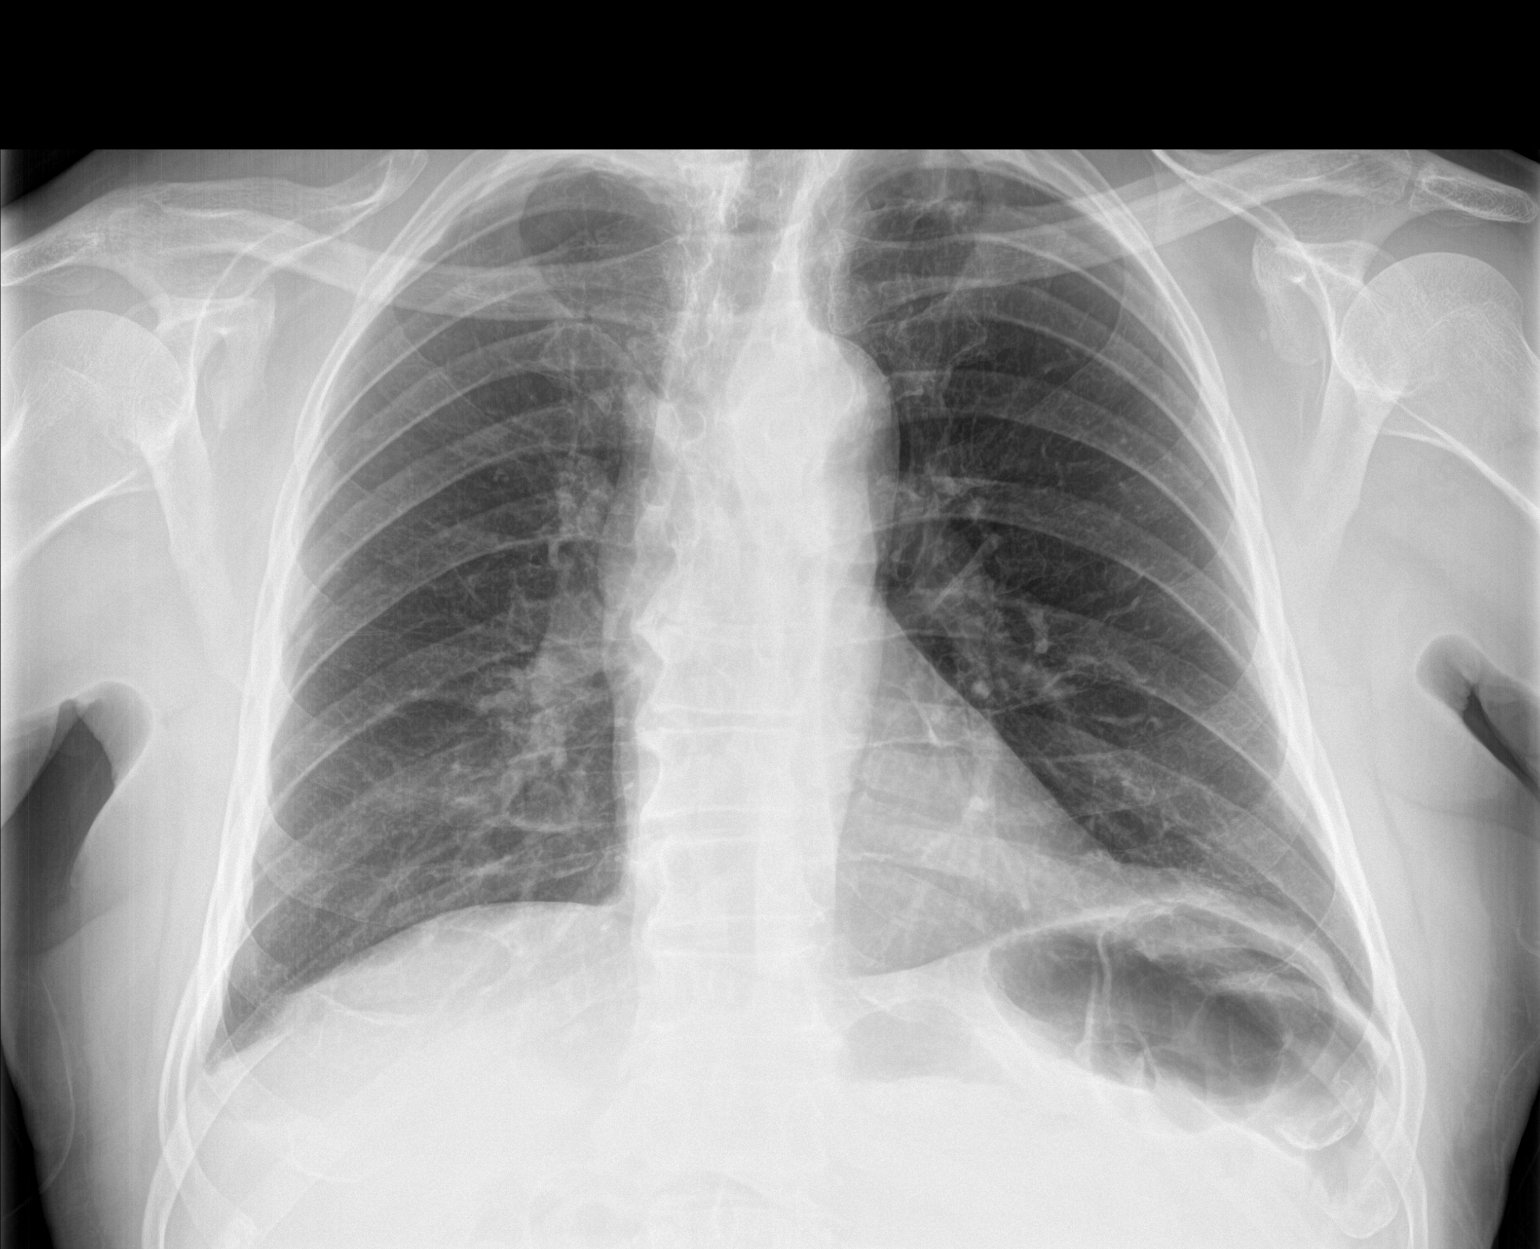

[Series 2: chest lat · 0.14mm/px · 2 of 2 slices shown]
[im 1/2]
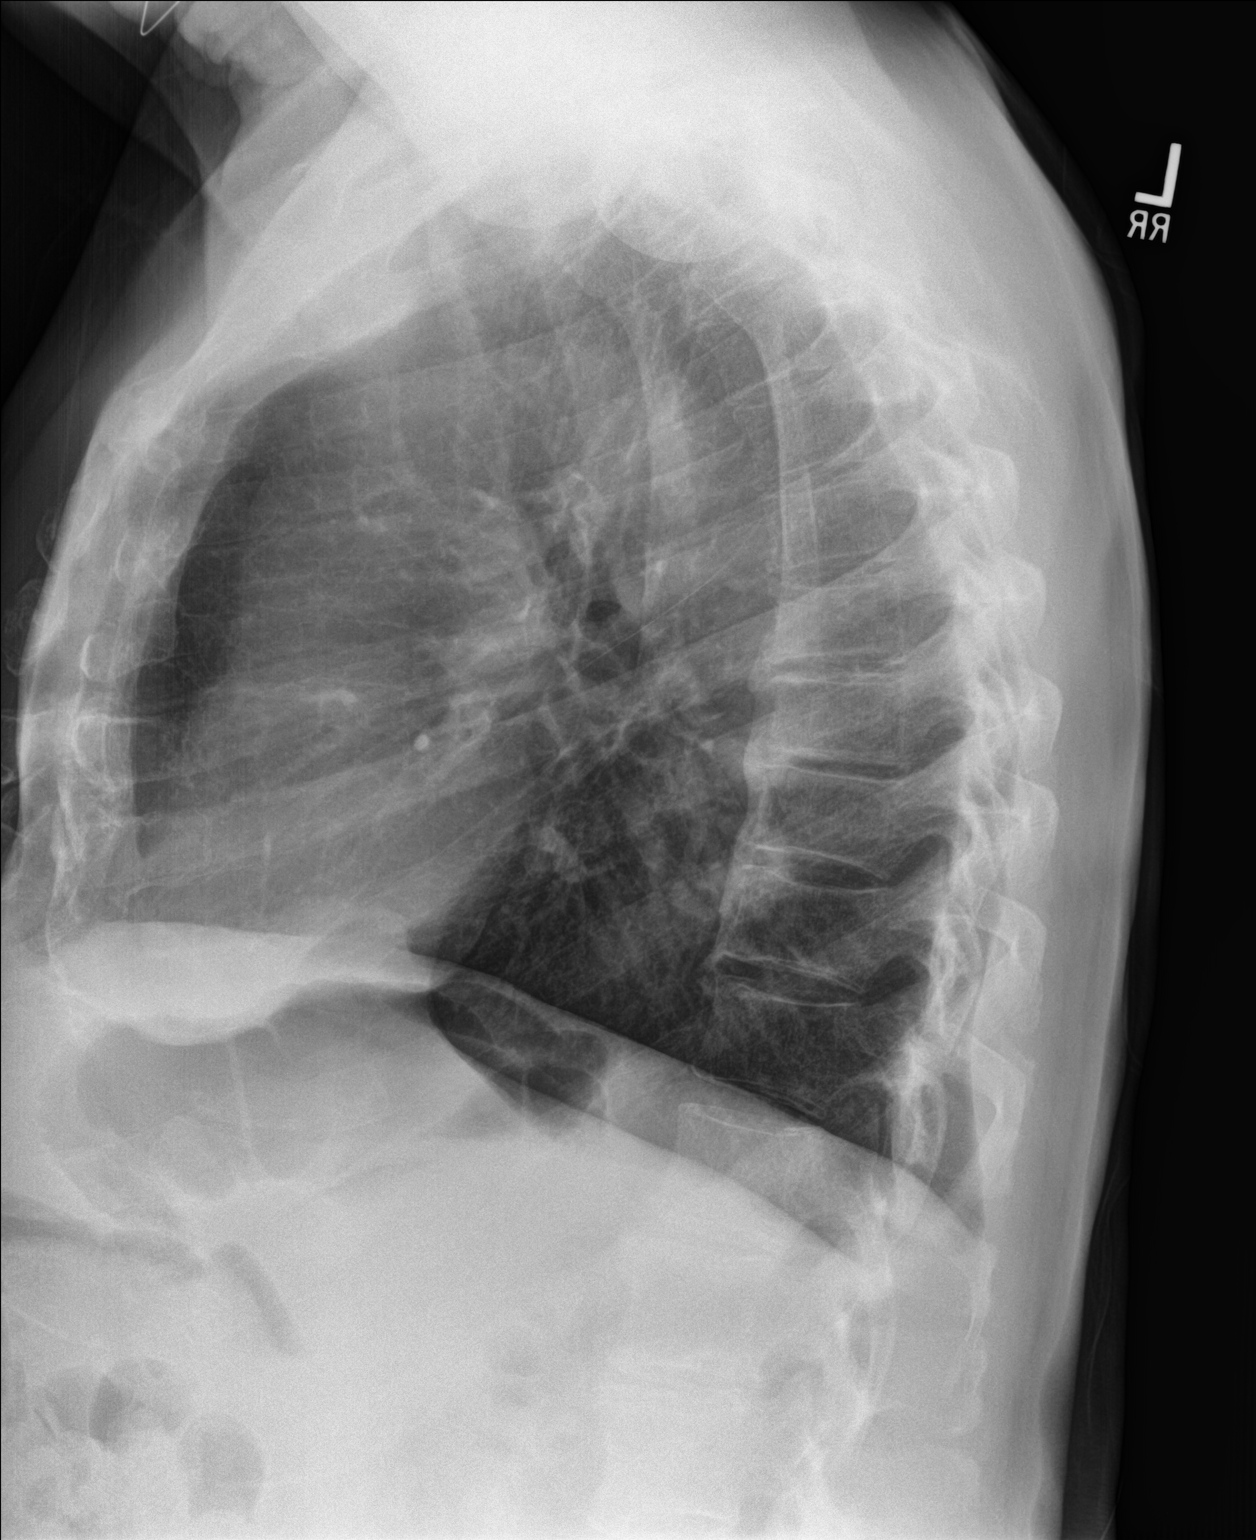
[im 2/2]
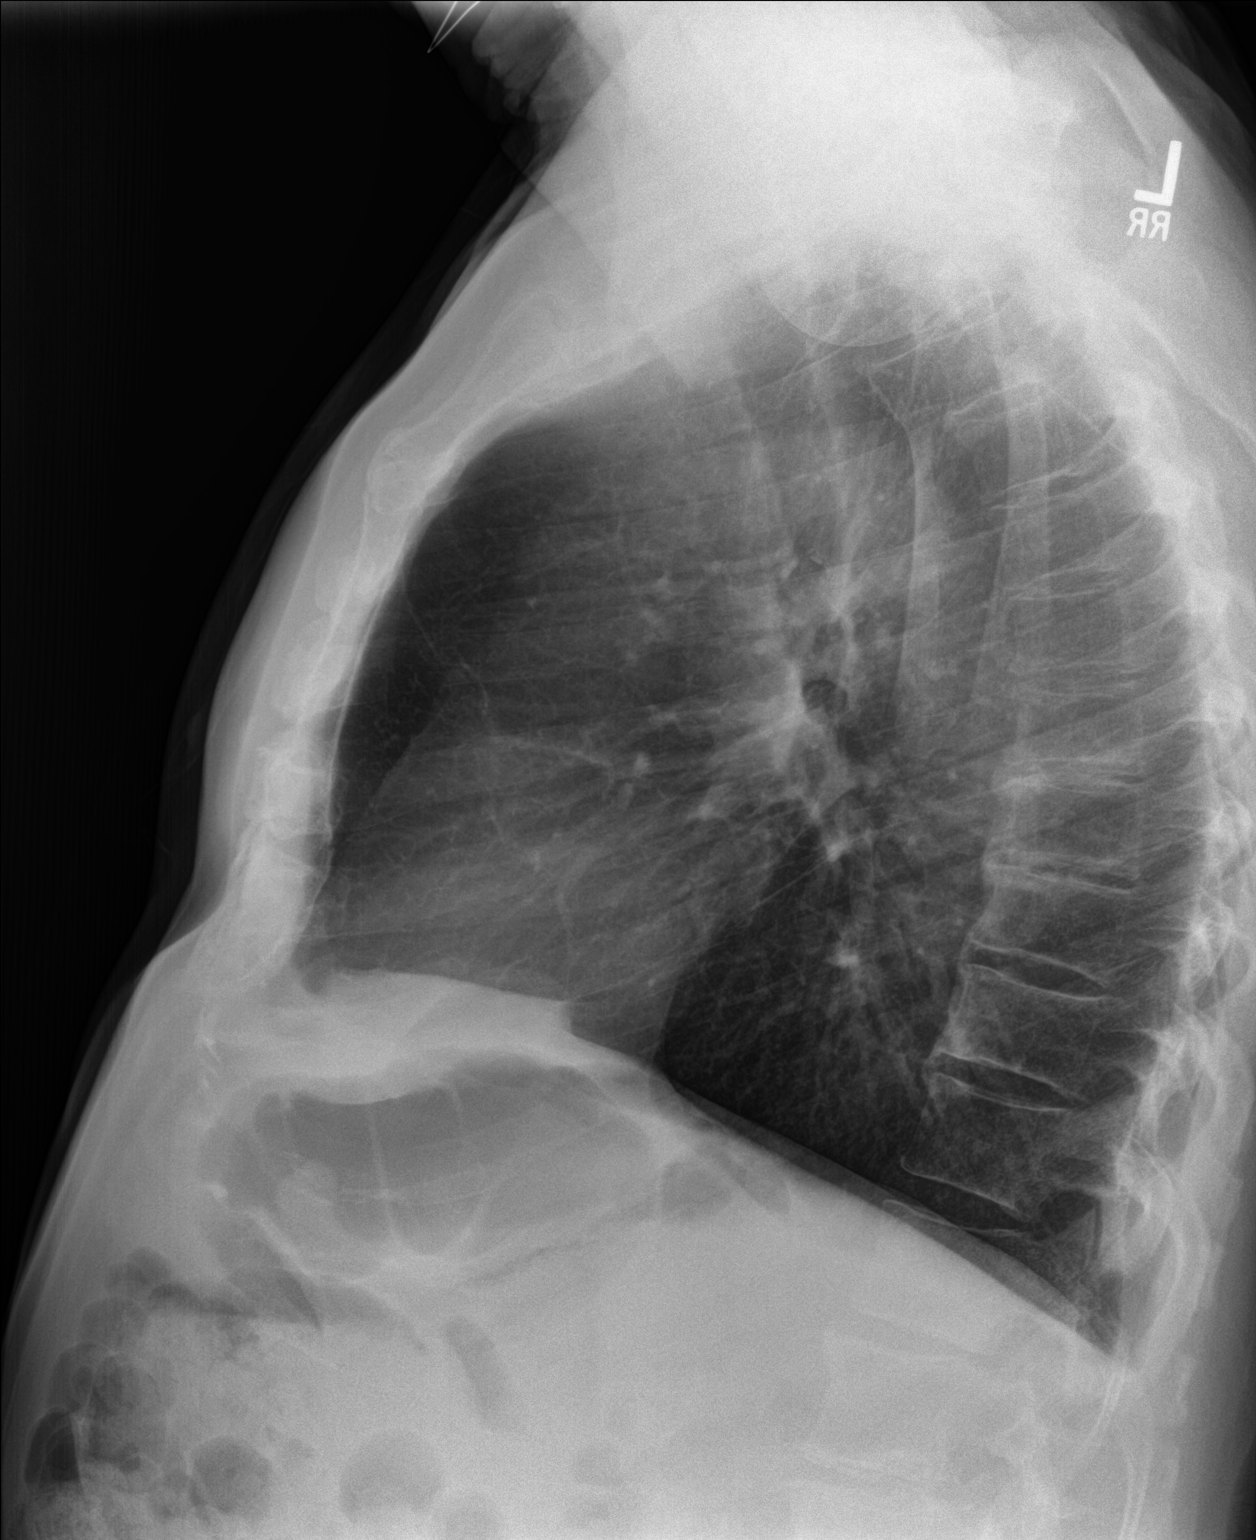

[3 of 3 positions shown; findings below may reference images not displayed]

FINDINGS: The heart size and mediastinal contours are within normal limits. No
pneumothorax or pleural effusion is noted. Left apical density is
noted concerning for possible neoplasm.. The visualized skeletal
structures are unremarkable.
IMPRESSION: Left apical density is noted concerning for possible neoplasm. CT
scan of the chest is recommended for further evaluation. These
results will be called to the ordering clinician or representative
by the Radiologist Assistant, and communication documented in the
PACS or zVision Dashboard.

## 2019-03-16 ENCOUNTER — Encounter: Payer: Self-pay | Admitting: Family Medicine

## 2019-03-16 ENCOUNTER — Other Ambulatory Visit: Payer: Self-pay

## 2019-03-16 ENCOUNTER — Ambulatory Visit (INDEPENDENT_AMBULATORY_CARE_PROVIDER_SITE_OTHER): Payer: 59 | Admitting: Family Medicine

## 2019-03-16 VITALS — BP 135/77 | HR 74 | Temp 97.1°F | Ht 65.0 in | Wt 166.6 lb

## 2019-03-16 DIAGNOSIS — G4733 Obstructive sleep apnea (adult) (pediatric): Secondary | ICD-10-CM | POA: Diagnosis not present

## 2019-03-16 DIAGNOSIS — R419 Unspecified symptoms and signs involving cognitive functions and awareness: Secondary | ICD-10-CM | POA: Diagnosis not present

## 2019-03-16 DIAGNOSIS — R519 Headache, unspecified: Secondary | ICD-10-CM

## 2019-03-16 DIAGNOSIS — Z9989 Dependence on other enabling machines and devices: Secondary | ICD-10-CM

## 2019-03-16 DIAGNOSIS — Z9181 History of falling: Secondary | ICD-10-CM | POA: Diagnosis not present

## 2019-03-16 NOTE — Progress Notes (Addendum)
PATIENT: [name] DOB: Jun 10, 1932  REASON FOR VISIT: follow up HISTORY FROM: patient  Chief Complaint  Patient presents with  . Follow-up    Rm 2, with grand daughter. CPAP works well. No concerns     HISTORY OF PRESENT ILLNESS: Today 03/16/19 [name] is a 85 y.o. male here today for follow up of obstructive sleep apnea on CPAP.  Initially has reports that he is doing very well with CPAP therapy and has no concerns.  He is much more concerned about several other complaints today.  He was seen earlier today by his primary care provider for blood pressure follow-up.  He reported at that time that he continues to have trouble with his memory, headaches and falls.  These concerns have been addressed with him at previous visits with Dr. Rexene Alberts.  CT of his head in 2019 was unremarkable.  In our discussion today, he is very apprehensive and concerned about 2 recent falls in the past few weeks.  He states that these falls are different from his previous falls as previously he felt that he was tripping over his feet.  Most recently he has had 2 falls while in a bended position, on one knee.  He reports that he was organizing something on the floor.  He was able to get to the bended position but after organizing his items, he tried to get up from the floor.  He felt that due to not having anything to hold onto and weakness in his lower back, lower extremities and bilateral knees, he fell forward.  He denies any injuries.  He denies any dizziness, lightheadedness, or palpitations.  He denies any falls while walking.  He does occasionally use his walker around the home.  He does not have his walker today in the office.  He does have a history of lumbar disc disease and degeneration.  He has had lumbar surgery in the past.  He describes radicular symptoms that have been going on for years.  No new numbness, new weakness or new pain.  He continues to have concerns of his short-term memory.  He feels  that he is not as sharp as he used to be.  He is able to perform all ADLs.  He is still driving.  He is able to dress his own medications.  He lives at home with his wife and 2 children.  He reports that headaches are unchanged.  He continues to have morning headaches but they are not as severe. He continues low dose gabapentin at night.  He denies any adverse effects from medication.  No dizziness or sedation noted.  Compliance report dated 02/13/2019 through 03/14/2019 reveals that he has used CPAP 28 of the last 30 days for compliance of 93%.  Percentage of days with usage greater than 4 hours was 93.3%.  Average usage was 6 hours and 34 minutes.  Residual AHI was 3.6 on 9 cm of water.  There was no significant leak noted.  Initially, I commended him on regular compliance with CPAP therapy.  After continued conversation, he reports that he is not sleeping with CPAP.  He uses CPAP for approximately 6 hours during the evening.  He will usually place CPAP therapy around 7 PM.  Around 1:59 AM he will remove his mask and go to sleep.  Occasionally he will drift off to sleep during this timeframe but states that he only sleeps for 20 to 30 minutes at a time.  He states that  CPAP therapy is uncomfortable and he cannot sleep well with his mask on.  Upon review of previous notes, compliance appears to be a persistent problem.  He has no difficulty with his mask, supplies or tubing while awake.  He is uncertain whether he can get used to using CPAP while sleeping.   HISTORY: (copied from Dr Guadelupe Sabin note on 12/15/2018)   [name] is an 85 year old right-handed gentleman with an underlying medical history of recent falls, chronic kidney disease, hypertension, history of prostate cancer, hearing loss, hypothyroidism, thyroid nodules, vision loss, hypogonadism, hyperlipidemia, sleep apnea, reflux disease and mildly overweight state, who presents for follow-up consultation of his severe obstructive sleep apnea and  recurrent headaches.  The patient is accompanied by a [place] interpreter, [place], today.  I last saw him on 09/09/2018, at which time he was compliant with his CPAP.  He continued to have recurrent headaches.  He had recent teeth extractions during which time he was not able to use his CPAP but other than that he was compliant.  I suggested a trial of gabapentin for headache management.  He was advised to continue with CPAP therapy.    Today, 12/15/2018: I reviewed his CPAP compliance data from 09/15/2018 through 12/13/2018 which is the last 90 days, during which time he used his machine only 16 days with percent use days greater than 4 hours at 2.2% only, indicating very low compliance.  In the past 30 days he used his machine only 1 day.  Set up date was 03/26/2018.  He has recently restarted using his machine for the past 2 days.  He reports that he had difficulty using his CPAP secondary to teeth extractions and getting new dentures.  He got new dentures some 2 days ago and has in fact restarted using his CPAP.  He is still struggling to get used to it again.  He admits that when he is able to sleep with it even for a few hours he actually feels better.  When he uses the machine, sometimes the pressure bothers him and sometimes it seems not enough.  He is using a fullface mask.  He takes his dentures out at night, he would be willing to try a different style of mask and a lower pressure even.  He is quite pleased with his headache management.  He has not had any serious headaches or recurrence of significant headaches and has been successfully taking low-dose gabapentin at night 100 mg strength and sometimes takes melatonin at night.  He indicates no side effects with the gabapentin.  He is motivated to continue with the CPAP and would be willing to try a different style of mask.    The patient's allergies, current medications, family history, past medical history, past social history, past surgical history  and problem list were reviewed and updated as appropriate.   Previously:  I first met him on 02/09/2018 at the request of his primary care physician, at which time the patient reported a several month history of recurrent headaches including morning headaches and he also carried a prior diagnosis of severe obstructive sleep apnea but was not on treatment for this.  He was advised to proceed with a sleep study.  He had a split-night sleep study on 03/02/2018 which showed severe obstructive sleep apnea with an AHI of 40.7/h, REM AHI of 46.2/h, O2 nadir of 75%.  He was titrated on CPAP from 5 cm to 9 cm.  On the final pressure his AHI was 0.6/h  with nonsupine REM sleep achieved an O2 nadir of 91%.   He saw Cecille Rubin, nurse practitioner, in the interim on 06/07/2018, at which time he was still adjusting to CPAP therapy and somewhat suboptimal with his overall compliance for more than 4 hours. He was advised to but he was motivated to continue  I reviewed his CPAP compliance data from 08/01/2018 through 08/30/2018 which is a total of 30 days, during which time he used his machine every night with percent used days greater than 4 hours at 80%, indicating very good compliance with an average usage of 5 hours and 3 minutes, residual AHI borderline at 6/h, pressure of 9 cm, leak fluctuates.   02/09/2018: (He) reports recurrent headaches for several months. They became worse after his falls recently. He has had morning headaches. His headaches are bifrontal and short-lived, lasting sometimes for seconds at a time with a severe sharp sensation. He has no significant neurological accompaniments. He has a history of taking ibuprofen on a regular basis for nearly 10 years but had to stop taking ibuprofen secondary to renal impairment. Lab work from February 2019 was reviewed. He has not been able to use his CPAP. He feels that it causes discomfort. He had sleep study testing originally in 2006 which showed mild to  moderate sleep apnea and then in 2012 he was diagnosed with severe sleep apnea with an AHI of 44. He was supposed to have a sleep study last year but could not have it done because of lack of interpreter at the time. He does not sleep very well. He is tired during the day, Epworth sleepiness score is 10 out of 24. Fatigue score is 39 out of 63. He is sluggish with his speech first thing in the morning. He is married and lives with his wife and 2 of his 4 children. He has 2 other older children from before. He reports difficulty with his short-term memory. He is supposed to have a thyroid nodule biopsy on 02/11/2018. I reviewed your office note from 10/06/2017. You ordered a Leonore, as reported to recent falls with injury to the head. He fell backwards once outside and hit his head backwards. He also fell inside the home in the bathroom and hit his head to the toilet commode. He had a head CT without contrast on 10/06/2017 and I reviewed the results: IMPRESSION: 1. No acute intracranial abnormality. He is a nonsmoker and does not utilize alcohol, drinks caffeine in the form of coffee, maybe 1 or 2 per day, up to 3 a day on average.   REVIEW OF SYSTEMS: Out of a complete 14 system review of symptoms, the patient complains only of the following symptoms, headaches, falls, back pain, knee pain and all other reviewed systems are negative.  Epworth sleepiness scale: 5 Fatigue severity scale: 35  ALLERGIES: Allergies  Allergen Reactions  . Hydrocodone Itching    HOME MEDICATIONS: Outpatient Medications Prior to Visit  Medication Sig Dispense Refill  . acetaminophen (TYLENOL) 500 MG tablet Take 1 tablet (500 mg total) by mouth every 6 (six) hours as needed. 30 tablet 0  . aspirin EC 81 MG tablet Take 81 mg by mouth daily.    Marland Kitchen esomeprazole (NEXIUM) 40 MG capsule Take 40 mg by mouth daily.    . fenofibrate 160 MG tablet Take 160 mg by mouth daily.    Marland Kitchen gabapentin (NEURONTIN) 100 MG capsule Take 1 capsule  (100 mg total) by mouth at bedtime as needed. 90 capsule  3  . levothyroxine (SYNTHROID, LEVOTHROID) 100 MCG tablet Take 100 mcg by mouth daily before breakfast.    . Melatonin 10 MG TABS Take 10 mg by mouth at bedtime.    . mirabegron ER (MYRBETRIQ) 50 MG TB24 tablet Take 50 mg by mouth daily.    . prednisoLONE acetate (PRED FORTE) 1 % ophthalmic suspension 1 drop daily.    . tamsulosin (FLOMAX) 0.4 MG CAPS capsule Take 0.4 mg by mouth daily.    . timolol (BETIMOL) 0.5 % ophthalmic solution Place 1 drop into the right eye 2 (two) times daily.    Marland Kitchen amLODipine (NORVASC) 5 MG tablet Take 1 tablet (5 mg total) by mouth daily. (Patient not taking: Reported on 03/16/2019) 90 tablet 3   No facility-administered medications prior to visit.    PAST MEDICAL HISTORY: Past Medical History:  Diagnosis Date  . Anxiety   . Benign pigmented nevus   . Cataract    LEFT EYE  . Depression   . Disc degeneration, lumbar   . Epigastric rebound abdominal tenderness   . Esophageal reflux   . Headache(784.0)   . Hearing loss   . Hemorrhoids   . Hiatal hernia   . Hyperlipidemia   . Hypothyroidism   . Insomnia   . OSA (obstructive sleep apnea)   . Osteoarthritis   . Reflux     PAST SURGICAL HISTORY: Past Surgical History:  Procedure Laterality Date  . BACK SURGERY     L4-L5  . 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 CHOLECYSTECTOMY    . CORNEAL TRANSPLANT     X3 TRANSPLANT AT [facility] CATARACT SURGERY COMPLICATIONS  . EYE SURGERY      FAMILY HISTORY: Family History  Problem Relation Age of Onset  . Cancer Brother   . Brain cancer Sister     SOCIAL HISTORY: Social History   Socioeconomic History  . Marital status: Married    Spouse name: Not on file  . Number of children: Not on file  . Years of education: Not on file  . Highest education level: Not on file  Occupational History  . Occupation: retired    Comment: Therapist, sports in [place]   Tobacco Use  . Smoking status: Never Smoker  . Smokeless tobacco: Never Used  Substance and Sexual Activity  . Alcohol use: No  . Drug use: No  . Sexual activity: Never  Other Topics Concern  . Not on file  Social History Narrative  . Not on file   Social Determinants of Health   Financial Resource Strain:   . Difficulty of Paying Living Expenses: Not on file  Food Insecurity:   . Worried About Charity fundraiser in the Last Year: Not on file  . Ran Out of Food in the Last Year: Not on file  Transportation Needs:   . Lack of Transportation (Medical): Not on file  . Lack of Transportation (Non-Medical): Not on file  Physical Activity:   . Days of Exercise per Week: Not on file  . Minutes of Exercise per Session: Not on file  Stress:   . Feeling of Stress : Not on file  Social Connections:   . Frequency of Communication with Friends and Family: Not on file  . Frequency of Social Gatherings with Friends and Family: Not on file  . Attends Religious Services: Not on file  . Active Member of Clubs or Organizations: Not  on file  . Attends Archivist Meetings: Not on file  . Marital Status: Not on file  Intimate Partner Violence:   . Fear of Current or Ex-Partner: Not on file  . Emotionally Abused: Not on file  . Physically Abused: Not on file  . Sexually Abused: Not on file    PHYSICAL EXAM  Vitals:   03/16/19 1236  BP: 135/77  Pulse: 74  Temp: (!) 97.1 F (36.2 C)  Weight: 166 lb 9.6 oz (75.6 kg)  Height: _0  (1.651 m)   Body mass index is 27.72 kg/m.  Generalized: Well developed, in no acute distress  Cardiology: normal rate and rhythm, no murmur noted Respiratory: Rotation bilaterally Neurological examination  Mentation: Alert oriented to time (to date, month, year and season but missed the day of the week), place (with exception of state, was able to tell me city/county/building name and floor of building), history taking. Follows all commands  speech and language fluent Cranial nerve II-XII: Pupils were equal round reactive to light. Extraocular movements were full, visual field were full on confrontational test. Facial sensation and strength were normal. Uvula tongue midline. Head turning and shoulder shrug  were normal and symmetric. Motor: The motor testing reveals 5 over 5 strength of all 4 extremities. Good symmetric motor tone is noted throughout.  Sensory: Sensory testing is intact to soft touch on all 4 extremities. No evidence of extinction is noted.  Coordination: Cerebellar testing reveals good finger-nose-finger and heel-to-shin bilaterally.  Gait and station: Gait is normal. No assistive device used. Romberg is negative. No drift is seen.  Reflexes: Deep tendon reflexes are symmetric and normal bilaterally.   DIAGNOSTIC DATA (LABS, IMAGING, TESTING) - I reviewed patient records, labs, notes, testing and imaging myself where available.  MMSE - Mini Mental State Exam 03/16/2019  Orientation to time 4  Orientation to Place 4  Registration 3  Attention/ Calculation 1  Recall 3  Language- name 2 objects 2  Language- repeat 1  Language- follow 3 step command 3  Language- read & follow direction 1  Write a sentence 1  Copy design 0  Copy design-comments named 9 animals  Total score 23     Lab Results  Component Value Date   WBC 4.7 07/29/2017   HGB 15.2 07/29/2017   HCT 45.8 07/29/2017   MCV 89.3 07/29/2017   PLT 239 05/22/2017      Component Value Date/Time   NA 142 05/22/2017 2027   NA 139 04/10/2017 1103   K 3.9 05/22/2017 2027   CL 113 (H) 05/22/2017 2027   CO2 18 (L) 05/22/2017 2027   GLUCOSE 102 (H) 05/22/2017 2027   BUN 33 (H) 05/22/2017 2027   BUN 17 04/10/2017 1103   CREATININE 1.45 (H) 05/22/2017 2027   CALCIUM 9.6 05/22/2017 2027   PROT 8.0 05/22/2017 2027   ALBUMIN 4.6 05/22/2017 2027   AST 44 (H) 05/22/2017 2027   ALT 41 05/22/2017 2027   ALKPHOS 51 05/22/2017 2027   BILITOT 0.8  05/22/2017 2027   GFRNONAA 43 (L) 05/22/2017 2027   GFRAA 50 (L) 05/22/2017 2027   Lab Results  Component Value Date   CHOL 184 05/06/2010   HDL 34 (L) 05/06/2010   LDLCALC 104 (H) 05/06/2010   TRIG 228 (H) 05/06/2010   CHOLHDL 5.4 Ratio 05/06/2010   Lab Results  Component Value Date   HGBA1C 5.0 03/29/2017   No results found for: [id number] Lab Results  Component Value Date  TSH 0.800 03/29/2017    ASSESSMENT AND PLAN 85 y.o. year old male  has a past medical history of Anxiety, Benign pigmented nevus, Cataract, Depression, Disc degeneration, lumbar, Epigastric rebound abdominal tenderness, Esophageal reflux, Headache(784.0), Hearing loss, Hemorrhoids, Hiatal hernia, Hyperlipidemia, Hypothyroidism, Insomnia, OSA (obstructive sleep apnea), Osteoarthritis, and Reflux. here with     ICD-10-CM   1. OSA on CPAP  G47.33    Z99.89   2. Recurrent headache  R51.9   3. History of fall  Z91.81   4. Cognitive complaints  R41.9     Mr. Rosier has many concerns today.  He is primarily focused on concern of recent falls.  There have been 2 falls while he was on bended knee leaning forward on his hands.  He was unable to stand up from this position and had nothing to hold onto.  There was no associated dizziness, lightheadedness, palpitations, chest pain, trouble breathing.  No numbness or unilateral weakness.  He does have a history of lumbar disease and is status post lumbar surgery.  He admittedly has chronic knee pain.  I do not feel that these falls are in association with any neurological deficit.  There are no falls from a standing position.  He is able to walk today without any assistive devices.  I have attempted to explain the mechanical nature of his fall.  I have advised that he not get into a position on the floor where he is unable to stand up.  I have advised that he use his walker consistently.  We have discussed use of CPAP in detail.  He is uncertain if he is able to tolerate this  while sleeping.  Compliance report reveals that he is using CPAP for greater than 6 hours consistently 93% of the time.  Unfortunately, these hours are while he is awake.  I have explained that this may not be beneficial and will not treat sleep apnea.  I have advised that he use CPAP every night and for greater than 4 hours every night while asleep.  He is requesting a follow-up with Dr. Rexene Alberts for assessment of fall and memory.  MMSE is 23 today.  I am concerned that his language barrier could play a role in testing, however, he was unable to drawl o'clock and did have some difficulty with serial subtractions.  CT in 2019 was unremarkable.  May consider an MRI in the future.  We will have him follow-up as requested with Dr. Rexene Alberts in 2 months to evaluate need for further work-up.  I have attempted to explain consequences of untreated sleep apnea in regards to memory and headaches.  I am uncertain if he has received my message.  I have provided additional information in his AVS in his native language of Spanish.  He will continue to follow-up with primary care as advised.  He verbalizes understanding and agreement with this plan.   No orders of the defined types were placed in this encounter.    No orders of the defined types were placed in this encounter.     I spent 60 minutes with the patient. 50% of this time was spent counseling and educating patient on plan of care and medications.    Debbora Presto, FNP-C 03/16/2019, 3:57 PM [facility] [address] [place], [place] [address] [phone]  I reviewed the above note and documentation by the Nurse Practitioner and agree with the history, exam, assessment and plan as outlined above. I was available for  consultation. Star Age, MD, PhD [facility])

## 2019-03-16 NOTE — Patient Instructions (Addendum)
Continue CPAP nightly and greater than 4 hours each night, I need you to use CPAP at night while you sleep.   Please continue discussion of other concerns (falls, back pain, knee pain, with PCP and orthopedics)  Continue gabapentin for headaches   Follow up with [place] in 2 months     Apnea del sueo Sleep Apnea La apnea del sueo afecta la respiracin mientras se duerme. Hace que la respiracin se detenga por poco tiempo o se vuelva superficial. Tambin puede aumentar el riesgo de:  Infarto de miocardio.  Accidente cerebrovascular.  Tener mucho sobrepeso (obesidad).  Diabetes.  Insuficiencia cardaca.  Latidos cardacos irregulares. El [place] del tratamiento es ayudarle a respirar normalmente otra vez. Cules son las causas? Existen tres tipos de apnea del sueo:  Apnea obstructiva del sueo. Esta ocurre cuando las vas respiratorias se obstruyen o colapsan.  Apnea central del sueo. Esta ocurre cuando el cerebro no enva las seales correctas a los msculos que controlan la respiracin.  Apnea mixta del sueo. Esta es una combinacin de apnea obstructiva y central del sueo. La causa ms frecuente de esta afeccin es la obstruccin o el colapso de las vas respiratorias. Esto puede suceder si:  Los msculos de la garganta estn demasiado relajados.  Tiene la lengua y las amgdalas demasiado grandes.  Tiene sobrepeso.  Tiene las vas respiratorias demasiado pequeas. Qu incrementa el riesgo?  Tener sobrepeso.  Fumar.  Tener vas respiratorias pequeas.  El envejecimiento.  Ser hombre.  El consumo de alcohol.  Tomar medicamentos para calmarse (sedantes o tranquilizantes).  Tener familiares con esta afeccin. Cules son los signos o los sntomas?  Dificultad para permanecer dormido.  Estar somnoliento o [place].  Enojarse mucho.  Ronquidos fuertes.  Dolor de cabeza por la maana.  Imposibilidad de enfocar la mente  (concentrarse).  Olvidar cosas.  Menos inters por el sexo.  Cambios en el estado de nimo.  Cambios en la personalidad.  Sentimientos de tristeza (depresin).  Levantarse mucho durante la noche para ir a Garment/textile technologist.  Sequedad en la boca.  Dolor de Investment banker, operational. Cmo se diagnostica?  Sus antecedentes mdicos.  Un examen fsico.  Ardelia Mems prueba que se realiza mientras la persona duerme (estudio del sueo). La prueba se realiza con mayor frecuencia en un laboratorio del sueo, pero tambin puede Press photographer. Cmo se trata?   Dormir de Associate Professor.  Usar un medicamento para eliminar la mucosidad de la nariz (descongestivo).  Evitar el consumo de alcohol, medicamentos que ayudan a relajarse o ciertos analgsicos (narcticos).  Bajar de [place], si es necesario.  Cambios en la dieta.  No fumar.  Usar una mquina para abrir las vas respiratorias mientras duerme; por ejemplo: ? Un aparato bucal. Se trata de una boquilla que desplaza la mandbula hacia adelante. ? Un dispositivo CPAP. Este dispositivo sopla aire a travs de una mscara cuando usted exhala. ? Un dispositivo EPAP. Este tiene vlvulas que se colocan en cada fosa nasal. ? Un dispositivo BPAP. Este dispositivo sopla aire a travs de una mscara cuando usted inhala y exhala.  Someterse a Psychiatric nurse tratamientos no Barista. Realizar un tratamiento para la apnea del sueo es importante. Sin tratamiento, esta afeccin puede derivar en lo siguiente:  Presin arterial alta.  Arteriopata coronaria.  En los hombres, no poder tener una ereccin (impotencia).  Reduccin de la capacidad de pensar. Siga estas instrucciones en su casa: Estilo de [employer] cambios que le haya recomendado  el mdico.  Siga una dieta saludable.  Baje de peso, si es necesario.  Evite el alcohol, los medicamentos para relajarse y Psychologist, forensic.  No consuma ningn producto que contenga nicotina o tabaco,  como cigarrillos, cigarrillos electrnicos y tabaco de Higher education careers adviser. Si necesita ayuda para dejar de fumar, consulte al [employer]. Instrucciones generales  [employer] de venta libre y los recetados solamente como se lo haya indicado el mdico.  Si le proporcionaron una mquina para usar mientras duerme, sela solamente como se lo haya indicado el mdico.  Si va a someterse a [place], no olvide informarle al mdico que tiene apnea del sueo. Puede ser necesario que lleve su dispositivo consigo.  Concurra a todas las visitas de seguimiento como se lo haya indicado el mdico. Esto es importante. Comunquese con un mdico si:  El Charity fundraiser para usar mientras duerme le [place] o parece no funcionar.  No se siente mejor.  [place]. Solicite ayuda inmediatamente si:  Le duele el pecho.  Tiene dificultad para inhalar suficiente aire.  Tiene molestias en la espalda, en los brazos o en el [place].  Tiene dificultad para hablar.  Siente debilidad en un lado del cuerpo.  Se le cae un lado de la cara. Estos sntomas pueden Sales executive. No espere a ver si los sntomas desaparecen. Solicite atencin mdica de inmediato. Comunquese con el servicio de emergencias de su localidad (911 en los [place]). No conduzca por sus propios medios [employer] hospital. Resumen  Esta afeccin afecta la respiracin durante el sueo.  La causa ms frecuente es la obstruccin o el colapso de las vas respiratorias.  El [place] del tratamiento es ayudarlo a respirar normalmente mientras duerme. Esta informacin no tiene Marine scientist el consejo del mdico. Asegrese de hacerle al mdico cualquier pregunta que tenga. Document Released: 04/19/2010 Document Revised: 12/09/2017 Document Reviewed: 12/09/2017 Elsevier Patient Education  2020 [place] [facility] y BPAP CPAP and BPAP Information La PPC y la BPAP son mtodos que ayudan a Ardelia Mems  persona a respirar con el uso de presin de Occupational hygienist. PPC es la sigla de "presin positiva continua de las vas areas". BPAP es la sigla en ingls de "bipresin positiva en las vas areas". En ambos mtodos, se insufla aire por la nariz o la boca en las vas respiratorias para mejorar la respiracin. Los mtodos de PPC y BPAP utilizan distintas cantidades de presin para insuflar aire. En el caso de PPC, la cantidad de presin es la misma durante la inspiracin y la espiracin. En el caso de la BPAP, la cantidad de presin se incrementa cuando inspira (inhala) para que pueda respirar ms profundamente. El mdico recomendar cul de los dos mtodos, [place] o BPAP, podra ser ms til en su caso. Por qu se utiliza el tratamiento con PPC y BPAP? Los mtodos de PPC o BPAP pueden ser tiles si usted tiene lo siguiente:  Apnea del sueo.  Enfermedad pulmonar obstructiva crnica (EPOC).  Insuficiencia cardaca.  Afecciones que debilitan los msculos del trax, que incluyen la distrofia muscular o enfermedades neurolgicas, como la esclerosis lateral amiotrfica (ELA).  Otros problemas que puedan hacer que la respiracin sea dbil, anormal o dificultosa. La PPC se utiliza con ms frecuencia para la apnea obstructiva del sueo (AOS) para evitar que las vas respiratorias colapsen cuando los msculos se relajan durante el sueo. Cmo se administra la PPC o la BPAP? Tanto la PPC como la BPAP se administran con  un pequeo dispositivo que tiene un tubo plstico flexible que se conecta a una mscara de plstico. Usted se Engineer, maintenance (IT). El aire se insufla por la mscara hacia la nariz o la boca. La cantidad de presin que se [place] para insuflar el aire puede ajustarse en el dispositivo. El mdico determinar la presin que debe administrarse segn sus necesidades individuales. Cundo deben utilizarse la PPC o la BPAP? En la mayora de [place], la [place] solo debe usarse al dormir. Generalmente, la mscara  debe usarse toda la noche y durante cualquier siesta en Games developer. Es posible que las personas con ciertas enfermedades tambin deban Risk manager la mscara en otros momentos, cuando estn despiertas. Siga las indicaciones del mdico acerca de cundo usar el dispositivo. Cules son algunos consejos para Teaching laboratory technician?   Debido a que la mscara debe ajustarse, Clinical research associate personas se sienten atrapadas o tienen sensacin de Environmental health practitioner (claustrofobia) cuando usan la mscara por primera vez. Si a usted le ocurre esto, debe acostumbrarse a Scientist, water quality. Una forma de hacerlo es sostener la mscara, sin apretarla, sobre la nariz o la boca, y Audiological scientist cada vez ms ajustada. Tambin puede aumentar gradualmente el tiempo que la [place].  Las mscaras estn disponibles en varios tipos y Therapist, sports. Algunas se [employer] boca y la [place], y otras solo sobre la Lawyer. Si la mscara no se ajusta bien, hable con su mdico para obtener [place].  Si usted [place] una mscara que se ajusta sobre la nariz y tiende a respirar por la boca, puede colocarse una correa en la barbilla para mantener la boca cerrada.  Los dispositivos de PPC y BPAP tienen alarmas que suenan si la mscara se sale o si hay una prdida.  Si tiene algn problema con la [place], es muy importante que hable con su mdico para Pension scheme manager la manera ms fcil de Tree surgeon su uso. No deje de Teaching laboratory technician. Dejar de usar la mscara podra tener un impacto negativo en su salud. Cules son algunos consejos para usar el dispositivo?  Coloque el dispositivo de PPC o BPAP sobre una mesa segura o un soporte que est cerca de un enchufe.  Sepa donde est ubicado en botn de encendido y apagado en el dispositivo.  Siga las indicaciones de su mdico sobre cmo Child psychotherapist la presin en el dispositivo y cundo debe usarlo.  No coma ni beba nada mientras el dispositivo de PPC o BPAP est encendido. Los alimentos o los lquidos podran ingresar en sus pulmones por la presin  de los dispositivos de PPC o BPAP.  No fume. Los residuos del humo del tabaco pueden daar el dispositivo.  Los dispositivos de PPC y BPAP pueden alquilarse o comprarse en empresas especializadas en el cuidado de la salud para el uso domstico. Hay muchas marcas disponibles de dispositivos. Alquilar un dispositivo antes de comprarlo puede ayudarlo a decidir cul tipo de dispositivo funciona mejor para usted.  Mantenga los dispositivos de PPC o BPAP y sus accesorios limpios. Pida instrucciones especficas a su mdico. Solicite ayuda inmediatamente si:  Tiene irritacin o zonas abiertas alrededor de la nariz o la boca, donde la [place] se [place].  Tiene dificultad para usar los dispositivos de PPC o BPAP.  No puede tolerar el uso de las mscaras de PPC o BPAP.  Siente dolor, molestias e hinchazn abdominal. Resumen  La PPC y la BPAP son mtodos que ayudan a Ardelia Mems persona a respirar con el uso de presin de Occupational hygienist.  Tanto la PPC  como la BPAP se administran con un pequeo dispositivo que tiene un tubo plstico flexible que se conecta a una mscara de plstico.  Si tiene algn problema con la Visual merchandiser, es muy importante que hable con su mdico para Pension scheme manager la manera ms fcil de Tree surgeon su uso. Esta informacin no tiene Marine scientist el consejo del mdico. Asegrese de hacerle al mdico cualquier pregunta que tenga. Document Released: 07/12/2012 Document Revised: 12/08/2017 Document Reviewed: 09/15/2016 Elsevier Patient Education  2020 [place] su riesgo de cadas Understanding Your Risk for [employer], millones de personas sufren lesiones graves debido a cadas. Es importante que comprenda su riesgo de caerse. Hable con el mdico acerca de su riesgo y qu hacer para reducirlo. Hay medidas que puede tomar en su casa para bajar su riesgo. Si de hecho sufre una cada grave, es importante que le informe al mdico. Caerse una vez incrementa su riesgo de volver a  caerse. Cmo pueden afectarme las cadas? Las lesiones graves debido a cadas son comunes. Estas incluyen las siguientes:  Huesos fracturados. La mayora de las fracturas de cadera son provocadas por cadas.  Lesin cerebral traumtica (LCT). Las cadas son la causa ms comn de la LCT. El miedo a caerse puede tambin hacer que uno evite actividades y se quede en la casa. Esto puede hacer que los msculos se debiliten y de hecho incrementar el riesgo de cada. Qu puede incrementar mi riesgo? Las lesiones graves debido a cadas ocurren Wellsite geologist de ms de 39 aos de edad. Los nios y los adultos jvenes de entre 15 y 29 tambin tienen un riesgo ms alto. Cuantos ms factores de riesgo de caerse se apliquen a usted, mayor es su riesgo. Entre los factores de riesgo se incluyen los siguientes:  Debilidad de la parte inferior del cuerpo.  Falta (deficiencia) de vitaminaD.  Huesos dbiles (osteoporosis).  Estar generalmente dbil o confundido debido a una enfermedad a largo plazo (crnica).  Mareos o problemas de equilibrio.  Disminucin de la visin.  Tener depresin.  Medicamentos que provocan mareos o somnolencia. Estos pueden incluir medicamentos para la presin arterial, la ansiedad, el insomnio o el edema, as como medicamentos analgsicos y Engineer, materials.  Consumir alcohol.  Dolor en el pie y calzado inadecuado.  Hacer un trabajo peligroso.  Haberse cado en el pasado.  Peligros de tropiezo en la casa, como desorden en el piso, alfombras flojas o iluminacin deficiente.  Tener mascotas o desorden en la casa. Qu medidas puedo tomar para bajar mi riesgo de cadas?      Mantngase en buen estado fsico: ? Haga ejercicios de fuerza y equilibrio. Considere tomar una clase habitual para aumentar la fuerza y [place] equilibrio. El yoga y [place] Bar[place] tai chi son buenas opciones. ? Hgase revisar los ojos todos los aos y Charity fundraiser su receta oftalmolgica segn  sea necesario.  Elimine todo el desorden de los pasillos y Ship broker, incluidos los Chief Strategy Officer.  Use un telfono inalmbrico.  No use tapetes. Asegrese de que todas las alfombras estn pegadas o adheridas al piso de forma segura.  Use una buena iluminacin en todos los [place]. Deje una linterna al lado de la cama.  Asegrese de que haya un camino libre de cosas desde la cama hasta el bao. Use luces nocturnas.  Instale agarraderas en la baera, la ducha y el inodoro. Use una alfombra antideslizante en la baera o ducha.  Instale barandas seguras a ambos lados de las escaleras.  Repare  los peldaos desiguales o rotos.  Use un bastn o un andador, segn las indicaciones del mdico.  Use zapatos antideslizantes. No use zapatos con tacones altos. No camine por la casa en medias o pantuflas.  Evite caminar en superficies con hielo o resbaladizas. Camine por el pasto en lugar de las aceras con hielo o resbalosas. Cuando pueda, use un derretidor de hielo para deshacerse del hielo de las aceras. Preguntas para hacerle al mdico  Puede ayudarme a evaluar mi riesgo de cadas?  Alguno de mis medicamentos me hace ms propenso a las cadas?  Debera tomar un suplemento de vitamina D?  Qu ejercicios puedo hacer para mejorar mi fuerza y equilibrio?  Debera hacerme revisar la visin?  Necesito una densitometra sea para determinar si tengo osteoporosis?  Ayudara si usara un bastn o andador? Dnde encontrar ms informacin  Centros para el Control y Publishing copy de Enfermedades(Centers for Disease Control and Prevention), STEADI: StoreMirror.com.cy  Programas Comunitarios de Prevencin de Arts administrator Programs): StoreMirror.com.cy  [place] ([employer] on Aging): [email or web address] Comunquese con un mdico si:  Se cae en su casa.  Tiene miedo de caerse en su casa.  Se siente dbil, somnoliento o mareado  en su casa. Resumen  [employer] a Proofreader de los 9 aos tienen alto riesgo de cadas. Sin embargo, las [employer] no son las nicas que se lesionan debido a cadas. Los nios y los adultos jvenes tambin tienen un riesgo mayor al normal.  Hable con el mdico acerca de su riesgo de caerse y cmo reducir Copy.  Tomar ciertas precauciones en la casa pude reducir su riesgo de caerse.  Si se cae, informe siempre a su mdico. Esta informacin no tiene como fin reemplazar el consejo del mdico. Asegrese de hacerle al mdico cualquier pregunta que tenga. Document Released: 01/16/2017 Document Revised: 06/16/2017 Document Reviewed: 01/16/2017 Elsevier Patient Education  2020 [employer].

## 2019-05-17 ENCOUNTER — Telehealth: Payer: Self-pay

## 2019-05-17 ENCOUNTER — Ambulatory Visit: Payer: 59 | Admitting: Neurology

## 2019-05-17 NOTE — Telephone Encounter (Signed)
Pt no showed 05/17/2019 appointment with Dr. Rexene Alberts.

## 2019-05-18 ENCOUNTER — Encounter: Payer: Self-pay | Admitting: Neurology

## 2019-06-02 ENCOUNTER — Encounter (HOSPITAL_COMMUNITY): Payer: Self-pay

## 2019-06-02 ENCOUNTER — Emergency Department (HOSPITAL_COMMUNITY): Payer: 59

## 2019-06-02 ENCOUNTER — Emergency Department (HOSPITAL_COMMUNITY)
Admission: EM | Admit: 2019-06-02 | Discharge: 2019-06-02 | Disposition: A | Discharge status: Home / Self Care | Payer: 59 | Attending: Emergency Medicine | Admitting: Emergency Medicine

## 2019-06-02 ENCOUNTER — Other Ambulatory Visit: Payer: Self-pay

## 2019-06-02 DIAGNOSIS — N183 Chronic kidney disease, stage 3 unspecified: Secondary | ICD-10-CM | POA: Insufficient documentation

## 2019-06-02 DIAGNOSIS — Z7982 Long term (current) use of aspirin: Secondary | ICD-10-CM | POA: Diagnosis not present

## 2019-06-02 DIAGNOSIS — I129 Hypertensive chronic kidney disease with stage 1 through stage 4 chronic kidney disease, or unspecified chronic kidney disease: Secondary | ICD-10-CM | POA: Insufficient documentation

## 2019-06-02 DIAGNOSIS — E039 Hypothyroidism, unspecified: Secondary | ICD-10-CM | POA: Insufficient documentation

## 2019-06-02 DIAGNOSIS — W230XXA Caught, crushed, jammed, or pinched between moving objects, initial encounter: Secondary | ICD-10-CM | POA: Insufficient documentation

## 2019-06-02 DIAGNOSIS — Y999 Unspecified external cause status: Secondary | ICD-10-CM | POA: Diagnosis not present

## 2019-06-02 DIAGNOSIS — Z8546 Personal history of malignant neoplasm of prostate: Secondary | ICD-10-CM | POA: Insufficient documentation

## 2019-06-02 DIAGNOSIS — Y929 Unspecified place or not applicable: Secondary | ICD-10-CM | POA: Diagnosis not present

## 2019-06-02 DIAGNOSIS — Z79899 Other long term (current) drug therapy: Secondary | ICD-10-CM | POA: Insufficient documentation

## 2019-06-02 DIAGNOSIS — Y939 Activity, unspecified: Secondary | ICD-10-CM | POA: Diagnosis not present

## 2019-06-02 DIAGNOSIS — S61210A Laceration without foreign body of right index finger without damage to nail, initial encounter: Secondary | ICD-10-CM | POA: Diagnosis present

## 2019-06-02 MED ORDER — LIDOCAINE HCL 2 % IJ SOLN
5.0000 mL | Freq: Once | INTRAMUSCULAR | Status: AC
  Administered 2019-06-02: 100 mg
  Filled 2019-06-02: qty 20

## 2019-06-02 NOTE — Discharge Instructions (Addendum)
Clean wound twice daily with water and soap.  Apply a small amount of antibiotic ointment and a clean dry dressing.  Then apply your finger splint.  Follow-up with the hand surgeon early next week for continued evaluation.  Call their office to make an appointment.  Return to the emergency room immediately for new or worsening symptoms or concerns, such as redness, increased swelling, fevers or any concerns at all.

## 2019-06-02 NOTE — ED Provider Notes (Signed)
[facility] Provider Note   CSN: [id number] Arrival date & time: 06/02/19  1608     History Chief Complaint  Patient presents with  . Extremity Laceration    [name] is a 86 y.o. male.  HPI     67-year-old male presents with a right index finger injury.  Patient states he caught the finger in a door today and he cut it over the first index finger DIP.  He notes that he had an old injury to that finger and has not been able to extend the DIP since he was a child.  He denies any increased difficulty extending or flexing his finger.  He denies any other injuries.  He states his last tetanus shot was 2 years ago.    Past Medical History:  Diagnosis Date  . Anxiety   . Benign pigmented nevus   . Cataract    LEFT EYE  . Depression   . Disc degeneration, lumbar   . Epigastric rebound abdominal tenderness   . Esophageal reflux   . Headache(784.0)   . Hearing loss   . Hemorrhoids   . Hiatal hernia   . Hyperlipidemia   . Hypothyroidism   . Insomnia   . OSA (obstructive sleep apnea)   . Osteoarthritis   . Reflux     Patient Active Problem List   Diagnosis Date Noted  . Obstructive sleep apnea treated with continuous positive airway pressure (CPAP) 06/07/2018  . History of fall 10/06/2017  . Recent head trauma 10/06/2017  . Post-traumatic headache, not intractable 10/06/2017  . Cough 07/29/2017  . Pleurodynia 07/29/2017  . Lower resp. tract infection 07/29/2017  . CKD (chronic kidney disease) stage 3, GFR 30-59 ml/min 05/01/2017  . Recent urinary tract infection 04/10/2017  . Hospital discharge follow-up 04/10/2017  . Insomnia 04/10/2017  . Sepsis ([place]) 03/29/2017  . Hypokalemia 03/29/2017  . Benign essential HTN 03/29/2017  . Prostate cancer ([place]) 03/29/2017  . UNSPECIFIED HEARING LOSS 04/30/2010  . HERNIATED LUMBOSACRAL DISC 02/29/2008  . HYPERTROPHY PROSTATE W/O UR OBST & OTH LUTS 01/26/2008  . NEVUS 03/01/2007  .  Hypothyroidism 12/16/2006  . HYPOGONADISM 12/16/2006  . Hyperlipidemia 12/16/2006  . OBSTRUCTIVE SLEEP APNEA 12/16/2006  . GERD 12/16/2006    Past Surgical History:  Procedure Laterality Date  . BACK SURGERY     L4-L5  . 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 CHOLECYSTECTOMY    . CORNEAL TRANSPLANT     X3 TRANSPLANT AT [facility] CATARACT SURGERY COMPLICATIONS  . EYE SURGERY         Family History  Problem Relation Age of Onset  . Cancer Brother   . Brain cancer Sister     Social History   Tobacco Use  . Smoking status: Never Smoker  . Smokeless tobacco: Never Used  Substance Use Topics  . Alcohol use: No  . Drug use: No    Home Medications Prior to Admission medications   Medication Sig Start Date End Date Taking? Authorizing Provider  acetaminophen (TYLENOL) 500 MG tablet Take 1 tablet (500 mg total) by mouth every 6 (six) hours as needed. 01/11/18   Bast, Tressia Miners A, NP  amLODipine (NORVASC) 5 MG tablet Take 1 tablet (5 mg total) by mouth daily. Patient not taking: Reported on 03/16/2019 05/01/17   Horald Pollen, MD  aspirin EC 81 MG tablet Take 81 mg by mouth daily.  [provider]  esomeprazole (NEXIUM) 40 MG capsule Take 40 mg by mouth daily. 07/28/16   [provider]  fenofibrate 160 MG tablet Take 160 mg by mouth daily.    [provider]  gabapentin (NEURONTIN) 100 MG capsule Take 1 capsule (100 mg total) by mouth at bedtime as needed. 12/15/18   Star Age, MD  levothyroxine (SYNTHROID, LEVOTHROID) 100 MCG tablet Take 100 mcg by mouth daily before breakfast.    [provider]  Melatonin 10 MG TABS Take 10 mg by mouth at bedtime.    [provider]  mirabegron ER (MYRBETRIQ) 50 MG TB24 tablet Take 50 mg by mouth daily.    [provider]  prednisoLONE acetate (PRED FORTE) 1 % ophthalmic suspension 1 drop daily.    [provider]    tamsulosin (FLOMAX) 0.4 MG CAPS capsule Take 0.4 mg by mouth daily.    [provider]  timolol (BETIMOL) 0.5 % ophthalmic solution Place 1 drop into the right eye 2 (two) times daily.    [provider]    Allergies    Hydrocodone  Review of Systems   Review of Systems  Constitutional: Negative for chills and fever.  Respiratory: Negative for shortness of breath.   Cardiovascular: Negative for chest pain.  Gastrointestinal: Negative for abdominal pain, nausea and vomiting.  Skin: Positive for wound.    Physical Exam Updated Vital Signs BP (!) 146/69   Pulse 91   Temp 98.4 F (36.9 C) (Oral)   Resp 16   SpO2 98%   Physical Exam Vitals and nursing note reviewed.  Constitutional:      Appearance: He is well-developed.  HENT:     Head: Normocephalic and atraumatic.  Eyes:     Conjunctiva/sclera: Conjunctivae normal.  Pulmonary:     Effort: Pulmonary effort is normal. No respiratory distress.  Musculoskeletal:        General: No tenderness or deformity. Normal range of motion.     Right hand: Laceration present. Normal pulse.     Left hand: Normal.       Hands:     Cervical back: Neck supple.  Skin:    General: Skin is warm and dry.     Findings: No erythema or rash.  Neurological:     Mental Status: He is alert and oriented to person, place, and time.  Psychiatric:        Behavior: Behavior normal.     ED Results / Procedures / Treatments   Labs (all labs ordered are listed, but only abnormal results are displayed) Labs Reviewed - No data to display  EKG None  Radiology DG Finger Index Right  Result Date: 06/02/2019 CLINICAL DATA:  Slammed in door EXAM: RIGHT INDEX FINGER 2+V COMPARISON:  None. FINDINGS: Soft tissue laceration along the dorsal aspect of the second distal interphalangeal joint. No visible fracture or traumatic malalignment. Mild flexion of the joint may suggest an underlying extensor injury. IMPRESSION: Soft tissue  laceration along the dorsal aspect of the second distal interphalangeal joint. Isolated flexion of the second DIP may suggest extensor injury. No visible fracture or other traumatic malalignment. Electronically Signed   By: Lovena Le M.D.   On: 06/02/2019 17:40    Procedures .Marland KitchenLaceration Repair  Date/Time: 06/02/2019 6:27 PM Performed by: Etter Sjogren, PA-C Authorized by: Etter Sjogren, PA-C   Consent:    Consent obtained:  Verbal   Consent given by:  Patient   Risks discussed:  Infection, need for additional repair, pain, poor cosmetic result and poor wound healing   Alternatives discussed:  No treatment and delayed treatment Universal protocol:    Procedure explained and questions answered to patient or proxy's satisfaction: yes     Relevant documents present and verified: yes     Test results available and properly labeled: yes     Imaging studies available: yes     Required blood products, implants, devices, and special equipment available: yes     Site/side marked: yes     Immediately prior to procedure, a time out was called: yes     Patient identity confirmed:  Verbally with patient Anesthesia (see MAR for exact dosages):    Anesthesia method:  Local infiltration   Local anesthetic:  Lidocaine 1% w/o epi Laceration details:    Location:  Finger   Finger location:  R index finger   Length (cm):  3   Depth (mm):  2 Repair type:    Repair type:  Simple Pre-procedure details:    Preparation:  Imaging obtained to evaluate for foreign bodies Exploration:    Hemostasis achieved with:  Direct pressure   Wound exploration: wound explored through full range of motion and entire depth of wound probed and visualized     Wound extent: no foreign bodies/material noted, no muscle damage noted, no underlying fracture noted and no vascular damage noted     Contaminated: no   Treatment:    Area cleansed with:  Betadine and saline   Amount of cleaning:  Extensive    Irrigation solution:  Sterile saline   Irrigation volume:  1000   Irrigation method:  Pressure wash Skin repair:    Repair method:  Sutures   Suture size:  4-0   Suture material:  Prolene   Suture technique:  Simple interrupted   Number of sutures:  4 Approximation:    Approximation:  Close Post-procedure details:    Dressing:  Antibiotic ointment and non-adherent dressing   Patient tolerance of procedure:  Tolerated well, no immediate complications Comments:     Finger splint applied   (including critical care time)  Medications Ordered in ED Medications  lidocaine (XYLOCAINE) 2 % (with pres) injection 100 mg (has no administration in time range)    ED Course  I have reviewed the triage vital signs and the nursing notes.  Pertinent labs & imaging results that were available during my care of the patient were reviewed by me and considered in my medical decision making (see chart for details).    MDM Rules/Calculators/A&P                      Pt presented with finger laceration of right index finger. Small laceration. Patient unable to fully extend DIP, likely secondary to old injury. X-ray shows no fracture. Wound cleaned extensively and closed with sutures. Discussed with Dr. Fredna Dow who was agreeable with sutures, splint and seen in the clinic next week.  Patient's tetanus is up-to-date as of 2 years ago.  Patient ready and stable for discharge.   At this time there does not appear to be any evidence of an acute emergency medical condition and the patient appears stable for discharge with appropriate outpatient follow up.Diagnosis was discussed with patient who verbalizes understanding and is agreeable to discharge.     Final Clinical Impression(s) / ED Diagnoses Final diagnoses:  None    Rx / DC Orders ED Discharge Orders    None  Etter Sjogren, PA-C 06/02/19 2221    Tegeler, Gwenyth Allegra, MD 06/03/19 [phone]

## 2019-06-02 NOTE — ED Notes (Signed)
Patient verbalizes understanding of discharge instructions. Opportunity for questioning and answers were provided. Armband removed by staff, pt discharged from ED ambulatory.   

## 2019-06-02 NOTE — ED Triage Notes (Signed)
Pt slammed his right index finger in a door today, bleeding controlled. Denies taking any blood thinners.

## 2019-12-02 ENCOUNTER — Encounter (HOSPITAL_COMMUNITY): Payer: Self-pay | Admitting: *Deleted

## 2019-12-02 ENCOUNTER — Other Ambulatory Visit: Payer: Self-pay

## 2019-12-02 ENCOUNTER — Ambulatory Visit (HOSPITAL_COMMUNITY)
Admission: EM | Admit: 2019-12-02 | Discharge: 2019-12-02 | Disposition: A | Discharge status: Home / Self Care | Payer: 59

## 2019-12-02 ENCOUNTER — Emergency Department (HOSPITAL_COMMUNITY)
Admission: EM | Admit: 2019-12-02 | Discharge: 2019-12-02 | Disposition: A | Discharge status: Home / Self Care | Payer: 59 | Attending: Emergency Medicine | Admitting: Emergency Medicine

## 2019-12-02 DIAGNOSIS — R42 Dizziness and giddiness: Secondary | ICD-10-CM | POA: Insufficient documentation

## 2019-12-02 DIAGNOSIS — Z5321 Procedure and treatment not carried out due to patient leaving prior to being seen by health care provider: Secondary | ICD-10-CM | POA: Insufficient documentation

## 2019-12-02 LAB — CBC
HCT: 47.5 % (ref 39.0–52.0)
Hemoglobin: 16.1 g/dL (ref 13.0–17.0)
MCH: 31.3 pg (ref 26.0–34.0)
MCHC: 33.9 g/dL (ref 30.0–36.0)
MCV: 92.2 fL (ref 80.0–100.0)
Platelets: 246 10*3/uL (ref 150–400)
RBC: 5.15 MIL/uL (ref 4.22–5.81)
RDW: 13.9 % (ref 11.5–15.5)
WBC: 11.9 10*3/uL — ABNORMAL HIGH (ref 4.0–10.5)
nRBC: 0 % (ref 0.0–0.2)

## 2019-12-02 LAB — BASIC METABOLIC PANEL
Anion gap: 13 (ref 5–15)
BUN: 17 mg/dL (ref 8–23)
CO2: 21 mmol/L — ABNORMAL LOW (ref 22–32)
Calcium: 10.3 mg/dL (ref 8.9–10.3)
Chloride: 102 mmol/L (ref 98–111)
Creatinine, Ser: 1.3 mg/dL — ABNORMAL HIGH (ref 0.61–1.24)
GFR calc Af Amer: 58 mL/min — ABNORMAL LOW (ref >60)
GFR calc non Af Amer: 50 mL/min — ABNORMAL LOW (ref >60)
Glucose, Bld: 135 mg/dL — ABNORMAL HIGH (ref 70–99)
Potassium: 3.6 mmol/L (ref 3.5–5.1)
Sodium: 136 mmol/L (ref 135–145)

## 2019-12-02 NOTE — ED Notes (Signed)
Patient reports headache, swimmy headed feeling, and general discomfort to entire chest. Patient is with family and states they will take him to the ED to be seen due to symptoms.

## 2019-12-02 NOTE — ED Triage Notes (Signed)
Used Optometrist. Pt reports having dizziness for several weeks. Had neck pain yesterday and went to pcp, given muscle relaxer. Reports feeling worse today. No acute distress noted at triage.

## 2019-12-02 NOTE — ED Notes (Signed)
Patient left voluntarily at 3:56pm

## 2019-12-26 ENCOUNTER — Other Ambulatory Visit: Payer: Self-pay | Admitting: Physician Assistant

## 2019-12-26 DIAGNOSIS — E041 Nontoxic single thyroid nodule: Secondary | ICD-10-CM

## 2020-01-03 ENCOUNTER — Other Ambulatory Visit (HOSPITAL_COMMUNITY)
Admission: RE | Admit: 2020-01-03 | Discharge: 2020-01-03 | Disposition: A | Discharge status: Home / Self Care | Payer: 59 | Source: Ambulatory Visit | Attending: Radiology | Admitting: Radiology

## 2020-01-03 ENCOUNTER — Ambulatory Visit
Admission: RE | Admit: 2020-01-03 | Discharge: 2020-01-03 | Disposition: A | Discharge status: Home / Self Care | Payer: 59 | Source: Ambulatory Visit | Attending: Physician Assistant | Admitting: Physician Assistant

## 2020-01-03 DIAGNOSIS — E041 Nontoxic single thyroid nodule: Secondary | ICD-10-CM

## 2020-01-03 DIAGNOSIS — D34 Benign neoplasm of thyroid gland: Secondary | ICD-10-CM | POA: Insufficient documentation

## 2020-01-04 LAB — CYTOLOGY - NON PAP

## 2020-01-10 ENCOUNTER — Other Ambulatory Visit: Payer: 59

## 2020-02-23 ENCOUNTER — Emergency Department (HOSPITAL_COMMUNITY)
Admission: EM | Admit: 2020-02-23 | Discharge: 2020-02-23 | Disposition: A | Discharge status: Home / Self Care | Payer: 59 | Attending: Emergency Medicine | Admitting: Emergency Medicine

## 2020-02-23 ENCOUNTER — Encounter (HOSPITAL_COMMUNITY): Payer: Self-pay | Admitting: Emergency Medicine

## 2020-02-23 ENCOUNTER — Other Ambulatory Visit: Payer: Self-pay

## 2020-02-23 ENCOUNTER — Emergency Department (HOSPITAL_COMMUNITY): Payer: 59

## 2020-02-23 DIAGNOSIS — W01198A Fall on same level from slipping, tripping and stumbling with subsequent striking against other object, initial encounter: Secondary | ICD-10-CM | POA: Insufficient documentation

## 2020-02-23 DIAGNOSIS — W19XXXA Unspecified fall, initial encounter: Secondary | ICD-10-CM

## 2020-02-23 DIAGNOSIS — Z79899 Other long term (current) drug therapy: Secondary | ICD-10-CM | POA: Insufficient documentation

## 2020-02-23 DIAGNOSIS — S0990XA Unspecified injury of head, initial encounter: Secondary | ICD-10-CM | POA: Diagnosis present

## 2020-02-23 DIAGNOSIS — S0101XA Laceration without foreign body of scalp, initial encounter: Secondary | ICD-10-CM | POA: Diagnosis not present

## 2020-02-23 DIAGNOSIS — S06360A Traumatic hemorrhage of cerebrum, unspecified, without loss of consciousness, initial encounter: Secondary | ICD-10-CM

## 2020-02-23 DIAGNOSIS — S14113A Complete lesion at C3 level of cervical spinal cord, initial encounter: Secondary | ICD-10-CM | POA: Insufficient documentation

## 2020-02-23 DIAGNOSIS — E039 Hypothyroidism, unspecified: Secondary | ICD-10-CM | POA: Diagnosis not present

## 2020-02-23 DIAGNOSIS — Z8546 Personal history of malignant neoplasm of prostate: Secondary | ICD-10-CM | POA: Insufficient documentation

## 2020-02-23 DIAGNOSIS — N183 Chronic kidney disease, stage 3 unspecified: Secondary | ICD-10-CM | POA: Insufficient documentation

## 2020-02-23 DIAGNOSIS — M899 Disorder of bone, unspecified: Secondary | ICD-10-CM

## 2020-02-23 DIAGNOSIS — I129 Hypertensive chronic kidney disease with stage 1 through stage 4 chronic kidney disease, or unspecified chronic kidney disease: Secondary | ICD-10-CM | POA: Diagnosis not present

## 2020-02-23 DIAGNOSIS — Z7982 Long term (current) use of aspirin: Secondary | ICD-10-CM | POA: Insufficient documentation

## 2020-02-23 LAB — PROTIME-INR
INR: 1.1 (ref 0.8–1.2)
Prothrombin Time: 13.5 seconds (ref 11.4–15.2)

## 2020-02-23 MED ORDER — ACETAMINOPHEN 325 MG PO TABS
650.0000 mg | ORAL_TABLET | Freq: Once | ORAL | Status: AC
  Administered 2020-02-23: 650 mg via ORAL
  Filled 2020-02-23: qty 2

## 2020-02-23 MED ORDER — LIDOCAINE-EPINEPHRINE (PF) 2 %-1:200000 IJ SOLN
10.0000 mL | Freq: Once | INTRAMUSCULAR | Status: DC
  Filled 2020-02-23: qty 20

## 2020-02-23 NOTE — ED Provider Notes (Addendum)
[place] DEPT Provider Note   CSN: [id number] Arrival date & time: 02/23/20  9562     History Chief Complaint  Patient presents with  . Fall  . Loss of Consciousness    [name] is a 86 y.o. male.  HPI   level 5 caveat secondary to language barrier 87 yo male presents with fall and head injury.  Daughter gives initial history. Patient fell when bending over to move past the back.  He fell backwards and struck the back of his head.  He reports no loss of consciousness but also states he does not remember what happened.  The daughter does not report loss of consciousness.  He was able to get up on its own.  Has had some bleeding from a laceration the back of his head but reports no other injuries.  He is not on any blood thinners.  Tetanus shot is reported up-to-date that he received at approximately 2 years ago  Past Medical History:  Diagnosis Date  . Anxiety   . Benign pigmented nevus   . Cataract    LEFT EYE  . Depression   . Disc degeneration, lumbar   . Epigastric rebound abdominal tenderness   . Esophageal reflux   . Headache(784.0)   . Hearing loss   . Hemorrhoids   . Hiatal hernia   . Hyperlipidemia   . Hypothyroidism   . Insomnia   . OSA (obstructive sleep apnea)   . Osteoarthritis   . Reflux     Patient Active Problem List   Diagnosis Date Noted  . Obstructive sleep apnea treated with continuous positive airway pressure (CPAP) 06/07/2018  . History of fall 10/06/2017  . Recent head trauma 10/06/2017  . Post-traumatic headache, not intractable 10/06/2017  . Cough 07/29/2017  . Pleurodynia 07/29/2017  . Lower resp. tract infection 07/29/2017  . CKD (chronic kidney disease) stage 3, GFR 30-59 ml/min (HCC) 05/01/2017  . Recent urinary tract infection 04/10/2017  . Hospital discharge follow-up 04/10/2017  . Insomnia 04/10/2017  . Sepsis ([place]) 03/29/2017  . Hypokalemia 03/29/2017  . Benign essential HTN 03/29/2017   . Prostate cancer ([place]) 03/29/2017  . UNSPECIFIED HEARING LOSS 04/30/2010  . HERNIATED LUMBOSACRAL DISC 02/29/2008  . HYPERTROPHY PROSTATE W/O UR OBST & OTH LUTS 01/26/2008  . NEVUS 03/01/2007  . Hypothyroidism 12/16/2006  . HYPOGONADISM 12/16/2006  . Hyperlipidemia 12/16/2006  . OBSTRUCTIVE SLEEP APNEA 12/16/2006  . GERD 12/16/2006    Past Surgical History:  Procedure Laterality Date  . BACK SURGERY     L4-L5  . 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 CHOLECYSTECTOMY    . CORNEAL TRANSPLANT     X3 TRANSPLANT AT [facility] CATARACT SURGERY COMPLICATIONS  . EYE SURGERY         Family History  Problem Relation Age of Onset  . Cancer Brother   . Brain cancer Sister     Social History   Tobacco Use  . Smoking status: Never Smoker  . Smokeless tobacco: Never Used  Vaping Use  . Vaping Use: Never used  Substance Use Topics  . Alcohol use: No  . Drug use: No    Home Medications Prior to Admission medications   Medication Sig Start Date End Date Taking? Authorizing Provider  acetaminophen (TYLENOL) 500 MG tablet Take 1 tablet (500 mg total) by mouth every 6 (six) hours as needed. 01/11/18   Rozanna Box,  Traci A, NP  amLODipine (NORVASC) 5 MG tablet Take 1 tablet (5 mg total) by mouth daily. Patient not taking: Reported on 03/16/2019 05/01/17   Horald Pollen, MD  aspirin EC 81 MG tablet Take 81 mg by mouth daily.    [provider]  esomeprazole (NEXIUM) 40 MG capsule Take 40 mg by mouth daily. 07/28/16   [provider]  fenofibrate 160 MG tablet Take 160 mg by mouth daily.    [provider]  gabapentin (NEURONTIN) 100 MG capsule Take 1 capsule (100 mg total) by mouth at bedtime as needed. 12/15/18   Star Age, MD  levothyroxine (SYNTHROID, LEVOTHROID) 100 MCG tablet Take 100 mcg by mouth daily before breakfast.    [provider]  Melatonin 10 MG TABS Take 10 mg by mouth at  bedtime.    [provider]  mirabegron ER (MYRBETRIQ) 50 MG TB24 tablet Take 50 mg by mouth daily.    [provider]  prednisoLONE acetate (PRED FORTE) 1 % ophthalmic suspension 1 drop daily.    [provider]  tamsulosin (FLOMAX) 0.4 MG CAPS capsule Take 0.4 mg by mouth daily.    [provider]  timolol (BETIMOL) 0.5 % ophthalmic solution Place 1 drop into the right eye 2 (two) times daily.    [provider]    Allergies    Hydrocodone  Review of Systems   Review of Systems  All other systems reviewed and are negative.   Physical Exam Updated Vital Signs Ht 1.626 m (5\' 4" )   BMI 28.60 kg/m   Physical Exam Vitals and nursing note reviewed.  Constitutional:      General: He is not in acute distress.    Appearance: Normal appearance.  HENT:     Head: Normocephalic.     Comments: 3 cm laceration occiput    Right Ear: External ear normal.     Left Ear: External ear normal.     Ears:     Comments: No swelling, bruising, or tenderness noted    Nose: Nose normal.  Eyes:     Comments: Right pupil s/p surgery Left pupil mid size and reactive   Neck:     Comments: No cervical tenderness palpation with full active range of movement Cardiovascular:     Rate and Rhythm: Normal rate and regular rhythm.  Pulmonary:     Effort: Pulmonary effort is normal.     Breath sounds: Normal breath sounds.  Abdominal:     Palpations: Abdomen is soft.  Musculoskeletal:        General: Normal range of motion.     Cervical back: Normal range of motion and neck supple.     Comments: No tenderness palpation over thoracic or lumbar spine No obvious external signs of trauma on back, chest, abdomen, or extremities  Skin:    General: Skin is warm and dry.     Capillary Refill: Capillary refill takes less than 2 seconds.  Neurological:     General: No focal deficit present.     Mental Status: He is alert.     Cranial Nerves: No cranial nerve  deficit.     Sensory: No sensory deficit.     Motor: No weakness.     Coordination: Coordination normal.     Deep Tendon Reflexes: Reflexes normal.  Psychiatric:        Mood and Affect: Mood normal.     ED Results / Procedures / Treatments   Labs (all  labs ordered are listed, but only abnormal results are displayed) Labs Reviewed - No data to display  EKG None  Radiology CT Head Wo Contrast  Result Date: 02/23/2020 CLINICAL DATA:  Head trauma, fell this morning, LEFT neck pain, headache; past history of benign essential hypertension, stage III chronic kidney disease, prostate cancer EXAM: CT HEAD WITHOUT CONTRAST CT CERVICAL SPINE WITHOUT CONTRAST TECHNIQUE: Multidetector CT imaging of the head and cervical spine was performed following the standard protocol without intravenous contrast. Multiplanar CT image reconstructions of the cervical spine were also generated. COMPARISON:  CT head 10/06/2017 FINDINGS: CT HEAD FINDINGS Brain: Generalized atrophy. Normal ventricular morphology. No midline shift or mass effect. Small LEFT basal ganglia calcifications stable. New high attenuation acute extra-axial hemorrhage identified at anterior interhemispheric fissure extending to the falx and into sulci at the medial aspect of the LEFT frontal lobe. Area of hemorrhage measures approximately 1.6 x 1.7 x 3.6 cm in greatest dimensions. No intraparenchymal hemorrhage or acute infarction. No mass. Vascular: Atherosclerotic calcifications of internal carotid arteries at skull base Skull: Calvaria intact.  Skin clips at occipital scalp. Sinuses/Orbits: Paranasal sinuses and mastoid air cells clear Other: N/A CT CERVICAL SPINE FINDINGS Alignment: Normal Skull base and vertebrae: Osseous demineralization. Vertebral body heights maintained. Multilevel disc space narrowing/endplate spur formation and facet degenerative changes throughout cervical spine. No acute fracture or subluxation. Sclerotic focus C3 vertebral  body, may be discogenic in origin but a sclerotic metastasis 12 x 12 x 8 mm from prostate cancer not excluded. Cervicothoracic scoliosis noted. Soft tissues and spinal canal: Prevertebral soft tissues normal thickness. Atherosclerotic calcifications aorta and within carotid systems. Disc levels:  No specific abnormalities Upper chest: Biapical lung scarring greater on LEFT. Other: N/A IMPRESSION: New acute extra-axial hemorrhage at the anterior interhemispheric fissure extending to the falx and into sulci at the medial aspect of the LEFT frontal lobe, 1.6 x 1.7 x 3.6 cm in greatest dimensions. No evidence of infarction or mass. Multilevel degenerative disc and facet disease changes of the cervical spine. Osseous demineralization. No acute cervical spine abnormalities. Sclerotic focus C3 vertebral body, question discogenic in origin though a sclerotic metastasis from prostate cancer not excluded. Aortic Atherosclerosis (ICD10-I70.0). Critical Value/emergent results were called by telephone at the time of interpretation on 02/23/2020 at 10:57 am to provider DR. Krystle Polcyn , who verbally acknowledged these results. Electronically Signed   By: Lavonia Dana M.D.   On: 02/23/2020 11:01   CT Cervical Spine Wo Contrast  Result Date: 02/23/2020 CLINICAL DATA:  Head trauma, fell this morning, LEFT neck pain, headache; past history of benign essential hypertension, stage III chronic kidney disease, prostate cancer EXAM: CT HEAD WITHOUT CONTRAST CT CERVICAL SPINE WITHOUT CONTRAST TECHNIQUE: Multidetector CT imaging of the head and cervical spine was performed following the standard protocol without intravenous contrast. Multiplanar CT image reconstructions of the cervical spine were also generated. COMPARISON:  CT head 10/06/2017 FINDINGS: CT HEAD FINDINGS Brain: Generalized atrophy. Normal ventricular morphology. No midline shift or mass effect. Small LEFT basal ganglia calcifications stable. New high attenuation acute  extra-axial hemorrhage identified at anterior interhemispheric fissure extending to the falx and into sulci at the medial aspect of the LEFT frontal lobe. Area of hemorrhage measures approximately 1.6 x 1.7 x 3.6 cm in greatest dimensions. No intraparenchymal hemorrhage or acute infarction. No mass. Vascular: Atherosclerotic calcifications of internal carotid arteries at skull base Skull: Calvaria intact.  Skin clips at occipital scalp. Sinuses/Orbits: Paranasal sinuses and mastoid air cells clear Other: N/A  CT CERVICAL SPINE FINDINGS Alignment: Normal Skull base and vertebrae: Osseous demineralization. Vertebral body heights maintained. Multilevel disc space narrowing/endplate spur formation and facet degenerative changes throughout cervical spine. No acute fracture or subluxation. Sclerotic focus C3 vertebral body, may be discogenic in origin but a sclerotic metastasis 12 x 12 x 8 mm from prostate cancer not excluded. Cervicothoracic scoliosis noted. Soft tissues and spinal canal: Prevertebral soft tissues normal thickness. Atherosclerotic calcifications aorta and within carotid systems. Disc levels:  No specific abnormalities Upper chest: Biapical lung scarring greater on LEFT. Other: N/A IMPRESSION: New acute extra-axial hemorrhage at the anterior interhemispheric fissure extending to the falx and into sulci at the medial aspect of the LEFT frontal lobe, 1.6 x 1.7 x 3.6 cm in greatest dimensions. No evidence of infarction or mass. Multilevel degenerative disc and facet disease changes of the cervical spine. Osseous demineralization. No acute cervical spine abnormalities. Sclerotic focus C3 vertebral body, question discogenic in origin though a sclerotic metastasis from prostate cancer not excluded. Aortic Atherosclerosis (ICD10-I70.0). Critical Value/emergent results were called by telephone at the time of interpretation on 02/23/2020 at 10:57 am to provider DR. Damonta Cossey , who verbally acknowledged these  results. Electronically Signed   By: Lavonia Dana M.D.   On: 02/23/2020 11:01    Procedures .Marland KitchenLaceration Repair  Date/Time: 02/23/2020 10:08 AM Performed by: Pattricia Boss, MD Authorized by: Pattricia Boss, MD   Consent:    Consent obtained:  Verbal   Consent given by:  Patient   Risks discussed:  Pain   Alternatives discussed:  No treatment Anesthesia (see MAR for exact dosages):    Anesthesia method:  Local infiltration   Local anesthetic:  Lidocaine 2% WITH epi Laceration details:    Location:  Scalp   Length (cm):  4 Repair type:    Repair type:  Simple Pre-procedure details:    Preparation:  Patient was prepped and draped in usual sterile fashion Exploration:    Hemostasis achieved with:  Direct pressure   Wound exploration: wound explored through full range of motion and entire depth of wound probed and visualized     Wound extent: no areolar tissue violation noted and no fascia violation noted     Contaminated: no   Treatment:    Area cleansed with:  Saline   Amount of cleaning:  Standard   Irrigation solution:  Sterile saline   Irrigation method:  Syringe   Visualized foreign bodies/material removed: no   Skin repair:    Repair method:  Staples   Number of staples:  3 Approximation:    Approximation:  Close Post-procedure details:    Dressing:  Open (no dressing)   Patient tolerance of procedure:  Tolerated well, no immediate complications  .Critical Care Performed by: Pattricia Boss, MD Authorized by: Pattricia Boss, MD   Critical care provider statement:    Critical care time (minutes):  45   Critical care end time:  02/23/2020 1:57 PM   Critical care was necessary to treat or prevent imminent or life-threatening deterioration of the following conditions:  CNS failure or compromise   Critical care was time spent personally by me on the following activities:  Discussions with consultants, evaluation of patient's response to treatment, examination of patient,  ordering and performing treatments and interventions, ordering and review of laboratory studies, ordering and review of radiographic studies, pulse oximetry, re-evaluation of patient's condition, obtaining history from patient or surrogate and review of old charts   (including critical care time)  Medications Ordered  in ED Medications  lidocaine-EPINEPHrine (XYLOCAINE W/EPI) 2 %-1:200000 (PF) injection 10 mL (has no administration in time range)    ED Course  I have reviewed the triage vital signs and the nursing notes.  Pertinent labs & imaging results that were available during my care of the patient were reviewed by me and considered in my medical decision making (see chart for details).  Clinical Course as of Feb 23 1339  Thu Feb 23, 2020  1114 Personally reviewed and discussed ct results with Dr. Thornton Papas Neurosurgery being consulted   [DR]  1137 Discussed patient's CT and clinical course with Dr. Ronnald Ramp.  Patient remained stable here in ED.  He has some headache, but has no lateralized weakness, change in mental status, or difference in pupils.  If patient remained stable until 6 hours post fall, 1:45 PM, patient will be discharged home with strict return precautions.  I discussed the results of the head CT and the cervical spine findings with patient and daughter.   [DR]  1155 Patient eligible for discharge at 1345 if remains stable.    [DR]  5093 Patient continues stable. Will proceed with plans for discharge.   [DR]    Clinical Course User Index [DR] Pattricia Boss, MD   MDM Rules/Calculators/A&P                          86 year old man who fell this morning and struck the back of his head. On work-up here, he has a normal neurological exam. He is complaining of headache has a laceration to his occiput. I occiput laceration was repaired. Head CT reviewed and patient noted to have intracerebral hemorrhage. This is been discussed with Dr. Ronnald Ramp. He has reviewed imaging. Patient has  been observed here in the ED for 6 hours post fall. He remains neurologically stable. I discussed return precautions and need for close follow-up with patient and daughter. They voiced understanding. Additionally, patient had lesion on C3 that does not appear to be traumatic, but given patient's history of prostate cancer will need close follow-up. Patient and daughter informed of above. Final Clinical Impression(s) / ED Diagnoses Final diagnoses:  Fall, initial encounter  Traumatic hemorrhage of cerebrum without loss of consciousness, unspecified laterality, initial encounter ([place])  Lesion of cervical vertebra    Rx / DC Orders ED Discharge Orders    None       Pattricia Boss, MD 02/23/20 1345    Pattricia Boss, MD 02/23/20 1358

## 2020-02-23 NOTE — Discharge Instructions (Addendum)
Staples out in 7 days There is bleeding inside the brain seen on your CT scan. Please check patient every 2 hours for 48 hours.  Return at any time if pupils become different sizes, he becomes less responsive, begins vomiting, headache worsens, or weakness occurs.  If this occurs, return to the [facility] Emergency Department.  Call Dr. Ronnald Ramp' office tomorrow to be seen in follow up next week.  Please follow up with your doctor regarding abnormal cervical ct with abnormality noted in body of 3rd cervical vertebra that is concerning for prostate cancer

## 2020-02-23 NOTE — ED Triage Notes (Signed)
Patient was at home and reportedly slipped while moving the rug outside the shower. Patient reports he does not remember hitting his head and is unsure if he passed out. Patient reports he bled a lot from the back of his head but was able to get up on his own. Patient denies blood thinners

## 2020-03-02 ENCOUNTER — Other Ambulatory Visit: Payer: Self-pay | Admitting: Neurological Surgery

## 2020-03-02 DIAGNOSIS — S0990XA Unspecified injury of head, initial encounter: Secondary | ICD-10-CM

## 2020-03-06 ENCOUNTER — Ambulatory Visit
Admission: RE | Admit: 2020-03-06 | Discharge: 2020-03-06 | Disposition: A | Discharge status: Home / Self Care | Payer: 59 | Source: Ambulatory Visit | Attending: Neurological Surgery | Admitting: Neurological Surgery

## 2020-03-06 DIAGNOSIS — S0990XA Unspecified injury of head, initial encounter: Secondary | ICD-10-CM

## 2020-04-12 ENCOUNTER — Other Ambulatory Visit: Payer: Self-pay | Admitting: Otolaryngology

## 2020-04-12 DIAGNOSIS — R1314 Dysphagia, pharyngoesophageal phase: Secondary | ICD-10-CM

## 2020-04-20 ENCOUNTER — Ambulatory Visit
Admission: RE | Admit: 2020-04-20 | Discharge: 2020-04-20 | Disposition: A | Discharge status: Home / Self Care | Payer: Medicare HMO | Source: Ambulatory Visit | Attending: Otolaryngology | Admitting: Otolaryngology

## 2020-04-20 DIAGNOSIS — R1314 Dysphagia, pharyngoesophageal phase: Secondary | ICD-10-CM

## 2020-09-18 ENCOUNTER — Other Ambulatory Visit: Payer: Self-pay | Admitting: Otolaryngology

## 2020-09-18 DIAGNOSIS — R1314 Dysphagia, pharyngoesophageal phase: Secondary | ICD-10-CM

## 2020-09-21 ENCOUNTER — Ambulatory Visit
Admission: RE | Admit: 2020-09-21 | Discharge: 2020-09-21 | Disposition: A | Discharge status: Home / Self Care | Payer: Medicare HMO | Source: Ambulatory Visit | Attending: Otolaryngology | Admitting: Otolaryngology

## 2020-09-21 DIAGNOSIS — R1314 Dysphagia, pharyngoesophageal phase: Secondary | ICD-10-CM

## 2020-10-31 ENCOUNTER — Encounter (HOSPITAL_BASED_OUTPATIENT_CLINIC_OR_DEPARTMENT_OTHER): Payer: Self-pay | Admitting: Urology

## 2020-10-31 ENCOUNTER — Other Ambulatory Visit: Payer: Self-pay | Admitting: Urology

## 2020-11-05 ENCOUNTER — Other Ambulatory Visit: Payer: Self-pay

## 2020-11-05 ENCOUNTER — Encounter (HOSPITAL_BASED_OUTPATIENT_CLINIC_OR_DEPARTMENT_OTHER): Payer: Self-pay | Admitting: Urology

## 2020-11-05 NOTE — H&P (Signed)
[name] is a 87 year-old male established patient who is here for an enlarged prostate follow-up evaluation.   He is currently taking tamsulosin. He is not on new medications for symptoms of prostate enlargement.   He does have an abnormal sensation when needing to urinate. He is not having problems getting his urine stream started. He does have a good size and strength to his urinary stream. He is not having problems with emptying his bladder well. He does dribble at the end of urination.   04/09/2017: He underwent TURP on 01/2017 with Dr. Butch Penny. His post op course was complicated by urinary retention and sepsis. He was treated with levaquin.   07/09/2017: He has starting and stopping of his stream. He has urgency but no urge incontinence. He has nocturia.   08/13/2017: He has stable LUTS after TURP.   10/22/2017: He has improvement in nocturia on mirabegron '50mg'$    12/15/2017: He has a weaker stream since last visit. He only urinates 2-3x per day. Nocturia 1x.   02/23/2018: For the past 2 weeks he has noted a weaker stream. He had a creatinine drawn at his PCP which was 1.33. he underwent renal US which showed no hydronephrosis.   04/02/2018: Since last visit he has noted improvement in his stream. He has had new abnormal burning sensation in his legs.   04/20/2018: He continues to have a weak stream. He is on vesicare '5mg'$  daily   05/26/2019: The stream improved with flomax 0.'4mg'$  daily. Nocturia 1x. urgency improved, urinary frequency improved    CC: I have urinary urgency.  HPI: He does have urgency. He does not have problems getting to the bathroom in time after he has the urge to urinate. The condition started approximately 12/29/2016. His symptoms have gotten worse over the last year.   He does not wear protective pads. He generally urinates every 2 hours in the daytime. He gets up at night to urinate 1 time. He is not having problems with emptying his bladder well.    07/09/2017: Since having his TURP he has noted worsening urgency without urge incontinence. He does have dribbling.   08/13/2017: He was prescribed mirabegron '25mg'$  daily which improved his urgency.   10/22/2017: He is taking mirabegron '50mg'$  and noted improvement in his nocturia 0-1x. He continues to have daytime urgency. He has a bowel movement every 2-3 days. He has the urgency to have a bowel movement and at that time he urinates   12/15/2017: He continues to have a strong urge to urinate and has suprapubic pain. He urinate 3x per day.   04/02/2018: He notes improvement in his urgency since last visit. He is on mirabegron '50mg'$  and uroxatral   09/10/2018: He is on mirabegron '50mg'$  which is not longer improving his urgency and nocturia.   04/21/2019: Urgency is improved but the stream is weaker   05/26/2019: Mirabegron '25mg'$  improved his urgency and urinary frequency   -11/18/19-patient with history of BPH with lower urinary tract symptoms. Patient is status post TURP in November of 2018 by Dr. Amalia Hailey in [facility]. Pathology returned less than 5% Gleason 6 adenocarcinoma of the prostate and has been followed expectantly. Has been managed with tamsulosin 0.4 mg daily and Myrbetriq 25 mg daily by Dr. Alyson Ingles. Currently the patient states that overall things doing fairly well except for occasional hesitancy and decreased force of stream.  Micro urinalysis today essentially negative except for rare bacteria. Postvoid residual equals 115 cc most recent PSA  is 0.39 11/11/2019  -02/22/20-patient with history of BPH and lower urinary tract symptoms status post TURP in 2018 as above. Recently increase tamsulosin to 0.4 mg twice daily and he has remained on Myrbetriq 25 mg daily for symptoms of urgency and frequency. He has noted perhaps improvement in the force of his urinary stream but he continues to have postvoid dribbling as main issue. I explained to him that sometimes after TURP people have some pooling of  urine within the prostatic urethra and or bulbous urethral which can then cause postvoid dribbling. I explained to him about straightening out that urethral angle by pressing on the perineal area after voiding to minimize postvoid dribbling.  -08/22/20-patient with history of BPH and lower urinary tract symptoms with history of TURP in 2018. Has been on tamsulosin 0.4 mg twice daily and also Myrbetriq 25 mg daily for urgency and frequency the patient had PSA was 0.45 on 08/15/2020. In the interim he has noted some mild discomfort with urination over the last 3-4 months mainly during the active urination with some suprapubic discomfort radiating to the testicles and then this alleviates after he finishes urination. He states his urine stream starts and stops and is fairly slow. Denies any hematuri. The patient also relates history of some sort of renal lesion that was imaged in [facility] by his primary care physician. I looked back in some old records and found MRI scan in 2018 which showed a unusual appearing cystic lesion in the left kidney. Do not know if he has had follow-up in this regard.  Micro urinalysis today was clear on urine spun sediment. Did show 3+ protein.  Postvoid residual equals119cc.   -09/28/20-patient with history of BPH and lower urinary tract symptoms. History of TURP in 2018. Recently switched from tamsulosin to silodosin 8 mg daily and also remains on Myrbetriq 25 mg daily. In the interim he has noted continued slow urinary stream and feelings of incomplete emptying.  Micro urinalysis today is clear on urine spun sediment  Second issue is history of cystic lesion the left kidney. Has had follow-up renal ultrasound which was reviewed and shows a 4.5 x 1.9 x 4 cm mildly complex left renal cyst with septation classified as Bosniak class 2 F. also has small simple cystic lesions in the left lower pole right middle and lower poles which are simple cyst. These all measure about 1-2 cm.  Postvoid residual measured at 169 cc  Patient to continue silodosin 8 mg daily but will stop the Myrbetriq 25 mg daily. See back in a month and if unimproved will proceed with cysto to assess prostate and bladder.  From renal cyst standpoint will repeat renal ultrasound in 6 months  -10/29/20-patient with history of BPH and lower urinary tract symptoms with history of TURP in 2018 as above. Has been minimally responsive to silodosin can Myrbetriq. Patient stop Myrbetriq but has continued on silodosin. In the interim he has noted continued weak force of stream. Now for cysto to assess bladder.  Cysto is performed today shows patent prostatic urethra the there is a concentric bladder neck contracture which just allows admission of the 16 French fiberoptic scope with mild resistance. Bladder is trabeculated with a diverticulum.     ALLERGIES: Hydrocodone - Itching    MEDICATIONS: Flomax 0.4 mg capsule 1 capsule PO Daily  Tamsulosin Hcl 0.4 mg capsule 1 capsule PO BID  Albuterol Sulfate Hfa 90 mcg hfa aerosol with adapter  Antidepressant  Aspir 81 81 mg  tablet, delayed release  Benadryl  BP Med  Fenofibrate 160 mg tablet  Gabapentin  Melatonin  Prednisolone Acetate 1 % suspension, drops  Sildenafil Citrate 20 mg tablet Take 1-5 tabs po daily prn ED  Silodosin 8 mg capsule 1 capsule PO Daily  Timolol Maleate 0.5 % drops     GU PSH: Remove Prostate. - 2018       PSH Notes: 7 right eye surgeries.  Jaw surgery June 2020.   NON-GU PSH: Back surgery Eye Surgery (Unspecified), Bilateral     GU PMH: BPH w/LUTS - 09/28/2020, - 08/22/2020, - 02/22/2020, - 05/26/2019, - 2021, - 2020, - 2020, - 2019, - 2019, - 2019, - 2019, - 2019, - 2019 Renal cyst - 09/28/2020, - 08/22/2020 Urinary Urgency - 09/28/2020, - 08/22/2020, - 05/26/2019, - 2021, - 2020, - 2020, - 2019, - 2019, - 2019, - 2019 ED due to arterial insufficiency - 08/22/2020, - 02/22/2020 Post-void dribbling - 02/22/2020 Prostate Cancer -  02/22/2020, - 2021, - 2020, - 2019, - 2019 Urinary Hesitancy - 11/18/2019 Weak Urinary Stream (Stable) - 11/18/2019, - 2020, - 2019 Nocturia - 2019, - 2019    NON-GU PMH: Anxiety Arthritis Asthma Depression GERD Glaucoma Hypercholesterolemia Hypertension Hypothyroidism    FAMILY HISTORY: 2 daughters - Daughter 4 sons - Son Death In The Family Father - Father Death In The Family Mother - Mother Tuberculosis - Mother   SOCIAL HISTORY: Marital Status: Married Preferred Language: Spanish; Castilian; Ethnicity: ; Race: Other Race Current Smoking Status: Patient has never smoked.   Tobacco Use Assessment Completed: Used Tobacco in last 30 days? Has never drank.  Drinks 2 caffeinated drinks per day. Patient's occupation is/was Retired.    REVIEW OF SYSTEMS:    GU Review Male:   Patient reports stream starts and stops, trouble starting your stream, and frequent urination. Patient denies penile pain, hard to postpone urination, burning/ pain with urination, erection problems, have to strain to urinate , get up at night to urinate, and leakage of urine.  Gastrointestinal (Upper):   Patient denies nausea, vomiting, and indigestion/ heartburn.  Gastrointestinal (Lower):   Patient reports constipation. Patient denies diarrhea.  Constitutional:   Patient reports night sweats. Patient denies fever, weight loss, and fatigue.  Skin:   Patient reports itching. Patient denies skin rash/ lesion.  Eyes:   Patient denies blurred vision and double vision.  Ears/ Nose/ Throat:   Patient denies sore throat and sinus problems.  Hematologic/Lymphatic:   Patient denies swollen glands and easy bruising.  Cardiovascular:   Patient denies leg swelling and chest pains.  Respiratory:   Patient denies cough and shortness of breath.  Endocrine:   Patient denies excessive thirst.  Musculoskeletal:   Patient reports back pain.   Neurological:   Patient reports headaches and dizziness.   Psychologic:    Patient denies depression and anxiety.   Notes: weak stream    VITAL SIGNS: None   Complexity of Data:   08/15/20 11/11/19 03/11/19 09/01/18 12/09/17 08/13/17  PSA  Total PSA 0.45 ng/mL 0.39 ng/mL 0.88 ng/mL 0.40 ng/mL 0.75 ng/mL 1.14 ng/mL    PROCEDURES:         Flexible Cystoscopy - 52000  Risks, benefits, and some of the potential complications of the procedure were discussed at length with the patient including infection, bleeding, voiding discomfort, urinary retention, fever, chills, sepsis, and others. All questions were answered. Informed consent was obtained. Antibiotic prophylaxis was given. Sterile technique and intraurethral analgesia were used.  Meatus:  Normal size. Normal location. Normal condition.  Urethra:  No strictures.  External Sphincter:  Normal.  Verumontanum:  Normal.  Prostate:  Non-obstructing. No hyperplasia.  Bladder Neck:  Moderate bladder neck contracture.  Ureteral Orifices:  Normal location. Normal size. Normal shape. Effluxed clear urine.  Bladder:  Cysto is performed today shows patent prostatic urethra the there is a concentric bladder neck contracture which just allows admission of the 16 French fiberoptic scope with mild resistance. Bladder is trabeculated with a diverticulum.      The lower urinary tract was carefully examined. The procedure was well-tolerated and without complications. Antibiotic instructions were given. Instructions were given to call the office immediately for bloody urine, difficulty urinating, urinary retention, painful or frequent urination, fever, chills, nausea, vomiting or other illness. The patient stated that he understood these instructions and would comply with them.         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 Recommended cysto and TUR of bladder neck contracture. Risks and benefits discussed and outlined in detail. Will schedule accordingly in the near future.

## 2020-11-05 NOTE — Progress Notes (Addendum)
Spoke w/ via phone for pre-op interview--- Pt thru daughter, [name], whom interpreted over phone for pt Lab needs dos----    Istat           Lab results------ current ekg in epic/ chart COVID test -----patient states asymptomatic no test needed Arrive at -------  1130 on 11-06-2020 NPO after MN NO Solid Food.  Clear liquids from MN until--- 1030 Med rec completed Medications to take morning of surgery ----- Symbicort inhaler, Gabapentin, Fenofibrate, Crestor, Norvasc, Synthroid, Nexium Primidone Diabetic medication ----- n/s Patient instructed no nail polish to be worn day of surgery Patient instructed to bring photo id and insurance card day of surgery Patient aware to have Driver (ride ) / caregiver    for 24 hours after surgery --son, Jannie Wenrick Patient Special Instructions ----- asked pt to bring rescue inhaler dos. And have cpap in car if needed Pre-Op special Istructions ----- pt requested his son to be interpreter dos, pt is aware is has to sign a waiver/ release Patient verbalized understanding of instructions that were given at this phone interview. Patient denies shortness of breath, chest pain, fever, cough at this phone interview.    Anesthesia Review:  HTN:  PVD:  Asthma mild persistant;  hx TB age 36 treated;  CKD 3  PCP: Dr Darreld Mclean. Karle Starch [facility] 10-31-2020 epic) Vascular:  Ardis Rowan PA [facility] 01-23-2020 epic) Pulmonology:  Worthy Keeler PA [facility] 10-07-2016 epic)_ Endocrinologist:  Dr Jerilynn Mages. Gorris ([place] 02-11-2018 epic) Neurologist:  Dr Johnette Abraham. Doy Hutching ([place] 07-31-2020 epic) Dr Rexene Alberts follows for OSA Chest x-ray : 07-29-2017 ;  chest CT 08-26-2016 [facility] EKG : 12-02-2019 epic Echo : no Stress test: no Cardiac Cath :  no Activity level: denies sob w/ any activity Sleep Study/ CPAP : YES/ YES Blood Thinner/ Instructions /Last Dose:  no ASA / Instructions/ Last Dose :  ASA '81mg'$  Per pt was told by Dr Milford Cage office to stop prior to surgery , last dose 11-01-2020

## 2020-11-06 ENCOUNTER — Encounter (HOSPITAL_BASED_OUTPATIENT_CLINIC_OR_DEPARTMENT_OTHER)
Admission: RE | Disposition: A | Discharge status: Home / Self Care | Payer: Self-pay | Source: Home / Self Care | Attending: Urology

## 2020-11-06 ENCOUNTER — Ambulatory Visit (HOSPITAL_BASED_OUTPATIENT_CLINIC_OR_DEPARTMENT_OTHER): Payer: Medicare HMO | Admitting: Anesthesiology

## 2020-11-06 ENCOUNTER — Other Ambulatory Visit: Payer: Self-pay

## 2020-11-06 ENCOUNTER — Ambulatory Visit (HOSPITAL_BASED_OUTPATIENT_CLINIC_OR_DEPARTMENT_OTHER)
Admission: RE | Admit: 2020-11-06 | Discharge: 2020-11-06 | Disposition: A | Discharge status: Home / Self Care | Payer: Medicare HMO | Attending: Urology | Admitting: Urology

## 2020-11-06 ENCOUNTER — Encounter (HOSPITAL_BASED_OUTPATIENT_CLINIC_OR_DEPARTMENT_OTHER): Payer: Self-pay | Admitting: Urology

## 2020-11-06 DIAGNOSIS — Z79899 Other long term (current) drug therapy: Secondary | ICD-10-CM | POA: Diagnosis not present

## 2020-11-06 DIAGNOSIS — N320 Bladder-neck obstruction: Secondary | ICD-10-CM | POA: Diagnosis not present

## 2020-11-06 DIAGNOSIS — N401 Enlarged prostate with lower urinary tract symptoms: Secondary | ICD-10-CM | POA: Insufficient documentation

## 2020-11-06 DIAGNOSIS — C61 Malignant neoplasm of prostate: Secondary | ICD-10-CM | POA: Insufficient documentation

## 2020-11-06 DIAGNOSIS — Z885 Allergy status to narcotic agent status: Secondary | ICD-10-CM | POA: Diagnosis not present

## 2020-11-06 DIAGNOSIS — Z7982 Long term (current) use of aspirin: Secondary | ICD-10-CM | POA: Insufficient documentation

## 2020-11-06 DIAGNOSIS — Z836 Family history of other diseases of the respiratory system: Secondary | ICD-10-CM | POA: Diagnosis not present

## 2020-11-06 HISTORY — DX: Bladder-neck obstruction: N32.0

## 2020-11-06 HISTORY — DX: Chronic kidney disease, stage 3 unspecified: N18.30

## 2020-11-06 HISTORY — DX: Peripheral vascular disease, unspecified: I73.9

## 2020-11-06 HISTORY — DX: Complete loss of teeth, unspecified cause, unspecified class: K08.109

## 2020-11-06 HISTORY — DX: Presence of external hearing-aid: Z97.4

## 2020-11-06 HISTORY — DX: Other symptoms and signs involving the musculoskeletal system: R29.898

## 2020-11-06 HISTORY — PX: TRANSURETHRAL RESECTION OF BLADDER NECK: SHX6196

## 2020-11-06 HISTORY — DX: Other cervical disc degeneration, unspecified cervical region: M50.30

## 2020-11-06 HISTORY — DX: Obstructive sleep apnea (adult) (pediatric): Z99.89

## 2020-11-06 HISTORY — DX: Adverse effect of glucocorticoids and synthetic analogues, initial encounter: T38.0X5A

## 2020-11-06 HISTORY — DX: Essential tremor: G25.0

## 2020-11-06 HISTORY — PX: CYSTOSCOPY: SHX5120

## 2020-11-06 HISTORY — DX: Presence of spectacles and contact lenses: Z97.3

## 2020-11-06 HISTORY — DX: Mild persistent asthma, uncomplicated: J45.30

## 2020-11-06 HISTORY — DX: Other seasonal allergic rhinitis: J30.2

## 2020-11-06 HISTORY — DX: Other nonspecific abnormal finding of lung field: R91.8

## 2020-11-06 HISTORY — DX: Gastro-esophageal reflux disease without esophagitis: K21.9

## 2020-11-06 HISTORY — DX: Other amnesia: R41.3

## 2020-11-06 HISTORY — DX: Benign paroxysmal vertigo, bilateral: H81.13

## 2020-11-06 HISTORY — DX: Adverse effect of glucocorticoids and synthetic analogues, initial encounter: H40.60X0

## 2020-11-06 HISTORY — DX: Nontoxic multinodular goiter: E04.2

## 2020-11-06 HISTORY — DX: Obstructive sleep apnea (adult) (pediatric): G47.33

## 2020-11-06 HISTORY — DX: Unspecified symptoms and signs involving the genitourinary system: R39.9

## 2020-11-06 SURGERY — RESECTION, BLADDER NECK, TRANSURETHRAL
Anesthesia: General | Site: Bladder

## 2020-11-06 MED ORDER — SODIUM CHLORIDE 0.9 % IV SOLN: INTRAVENOUS | Status: DC

## 2020-11-06 MED ORDER — 0.9 % SODIUM CHLORIDE (POUR BTL) OPTIME
TOPICAL | Status: DC | PRN
  Administered 2020-11-06: 500 mL

## 2020-11-06 MED ORDER — FENTANYL CITRATE (PF) 100 MCG/2ML IJ SOLN
INTRAMUSCULAR | Status: AC
  Filled 2020-11-06: qty 2

## 2020-11-06 MED ORDER — ONDANSETRON HCL 4 MG/2ML IJ SOLN
INTRAMUSCULAR | Status: AC
  Filled 2020-11-06: qty 2

## 2020-11-06 MED ORDER — EPHEDRINE SULFATE-NACL 50-0.9 MG/10ML-% IV SOSY
PREFILLED_SYRINGE | INTRAVENOUS | Status: DC | PRN
  Administered 2020-11-06: 5 mg via INTRAVENOUS
  Administered 2020-11-06 (×2): 10 mg via INTRAVENOUS

## 2020-11-06 MED ORDER — STERILE WATER FOR IRRIGATION IR SOLN
Status: DC | PRN
  Administered 2020-11-06: 1000 mL

## 2020-11-06 MED ORDER — SODIUM CHLORIDE 0.9 % IR SOLN
Status: DC | PRN
  Administered 2020-11-06: 6000 mL via INTRAVESICAL

## 2020-11-06 MED ORDER — FENTANYL CITRATE (PF) 100 MCG/2ML IJ SOLN: 25.0000 ug | INTRAMUSCULAR | Status: DC | PRN

## 2020-11-06 MED ORDER — FENTANYL CITRATE (PF) 100 MCG/2ML IJ SOLN
INTRAMUSCULAR | Status: DC | PRN
  Administered 2020-11-06: 50 ug via INTRAVENOUS

## 2020-11-06 MED ORDER — PHENYLEPHRINE 40 MCG/ML (10ML) SYRINGE FOR IV PUSH (FOR BLOOD PRESSURE SUPPORT)
PREFILLED_SYRINGE | INTRAVENOUS | Status: DC | PRN
  Administered 2020-11-06: 200 ug via INTRAVENOUS
  Administered 2020-11-06: 80 ug via INTRAVENOUS
  Administered 2020-11-06: 120 ug via INTRAVENOUS

## 2020-11-06 MED ORDER — PROPOFOL 10 MG/ML IV BOLUS
INTRAVENOUS | Status: AC
  Filled 2020-11-06: qty 20

## 2020-11-06 MED ORDER — EPHEDRINE 5 MG/ML INJ
INTRAVENOUS | Status: AC
  Filled 2020-11-06: qty 5

## 2020-11-06 MED ORDER — PROPOFOL 10 MG/ML IV BOLUS
INTRAVENOUS | Status: DC | PRN
  Administered 2020-11-06: 170 mg via INTRAVENOUS

## 2020-11-06 MED ORDER — LIDOCAINE 2% (20 MG/ML) 5 ML SYRINGE
INTRAMUSCULAR | Status: DC | PRN
  Administered 2020-11-06: 40 mg via INTRAVENOUS

## 2020-11-06 MED ORDER — LIDOCAINE HCL (PF) 2 % IJ SOLN
INTRAMUSCULAR | Status: AC
  Filled 2020-11-06: qty 5

## 2020-11-06 MED ORDER — CEFAZOLIN SODIUM 1 G IJ SOLR
INTRAMUSCULAR | Status: AC
  Filled 2020-11-06: qty 20

## 2020-11-06 MED ORDER — ONDANSETRON HCL 4 MG/2ML IJ SOLN
INTRAMUSCULAR | Status: DC | PRN
  Administered 2020-11-06: 4 mg via INTRAVENOUS

## 2020-11-06 MED ORDER — CEFAZOLIN SODIUM-DEXTROSE 2-3 GM-%(50ML) IV SOLR
INTRAVENOUS | Status: DC | PRN
  Administered 2020-11-06: 2 g via INTRAVENOUS

## 2020-11-06 MED ORDER — PHENYLEPHRINE 40 MCG/ML (10ML) SYRINGE FOR IV PUSH (FOR BLOOD PRESSURE SUPPORT)
PREFILLED_SYRINGE | INTRAVENOUS | Status: AC
  Filled 2020-11-06: qty 10

## 2020-11-06 SURGICAL SUPPLY — 34 items
BAG DRAIN URO-CYSTO SKYTR STRL (DRAIN) ×2 IMPLANT
BAG DRN RND TRDRP ANRFLXCHMBR (UROLOGICAL SUPPLIES) ×1
BAG DRN UROCATH (DRAIN) ×1
BAG URINE DRAIN 2000ML AR STRL (UROLOGICAL SUPPLIES) ×1 IMPLANT
BULB IRRIG PATHFIND (MISCELLANEOUS) IMPLANT
CATH FOLEY 3WAY 30CC 20FR (CATHETERS) ×1 IMPLANT
CATH URET 5FR 28IN CONE TIP (BALLOONS)
CATH URET 5FR 28IN OPEN ENDED (CATHETERS) IMPLANT
CATH URET 5FR 70CM CONE TIP (BALLOONS) IMPLANT
CLOTH BEACON ORANGE TIMEOUT ST (SAFETY) ×2 IMPLANT
ELECT BIVAP BIPO 22/24 DONUT (ELECTROSURGICAL) ×2
ELECT REM PT RETURN 9FT ADLT (ELECTROSURGICAL)
ELECTRD BIVAP BIPO 22/24 DONUT (ELECTROSURGICAL) IMPLANT
ELECTRODE REM PT RTRN 9FT ADLT (ELECTROSURGICAL) IMPLANT
FIBER LASER FLEXIVA 365 (UROLOGICAL SUPPLIES) IMPLANT
GLOVE SURG ENC MOIS LTX SZ7.5 (GLOVE) ×2 IMPLANT
GOWN STRL REUS W/TWL LRG LVL3 (GOWN DISPOSABLE) ×2 IMPLANT
GUIDEWIRE ANG ZIPWIRE 038X150 (WIRE) IMPLANT
GUIDEWIRE STR DUAL SENSOR (WIRE) IMPLANT
HOLDER FOLEY CATH W/STRAP (MISCELLANEOUS) ×1 IMPLANT
IV NS IRRIG 3000ML ARTHROMATIC (IV SOLUTION) ×3 IMPLANT
KIT TURNOVER CYSTO (KITS) ×2 IMPLANT
MANIFOLD NEPTUNE II (INSTRUMENTS) ×1 IMPLANT
NS IRRIG 500ML POUR BTL (IV SOLUTION) ×1 IMPLANT
PACK CYSTO (CUSTOM PROCEDURE TRAY) ×2 IMPLANT
PLUG CATH AND CAP STER (CATHETERS) ×1 IMPLANT
SYR 20ML LL LF (SYRINGE) ×2 IMPLANT
SYR 30ML LL (SYRINGE) ×1 IMPLANT
SYR TOOMEY IRRIG 70ML (MISCELLANEOUS) ×2
SYRINGE TOOMEY IRRIG 70ML (MISCELLANEOUS) IMPLANT
TRACTIP FLEXIVA PULS ID 200XHI (Laser) IMPLANT
TRACTIP FLEXIVA PULSE ID 200 (Laser)
TUBE CONNECTING 12X1/4 (SUCTIONS) ×1 IMPLANT
WATER STERILE IRR 1000ML POUR (IV SOLUTION) ×1 IMPLANT

## 2020-11-06 NOTE — Anesthesia Postprocedure Evaluation (Signed)
Anesthesia Post Note  Patient: [name]  Procedure(s) Performed: TRANSURETHRAL ABLATION OF BLADDER NECK CONTRACTURE (Bladder) CYSTOSCOPY (Bladder)     Patient location during evaluation: PACU Anesthesia Type: General Level of consciousness: awake Pain management: pain level controlled Vital Signs Assessment: post-procedure vital signs reviewed and stable Respiratory status: spontaneous breathing Postop Assessment: no apparent nausea or vomiting Anesthetic complications: no   No notable events documented.  Last Vitals:  Vitals:   11/06/20 1345 11/06/20 1400  BP: (!) 141/83 134/82  Pulse: 80 76  Resp: 15 18  Temp:  (!) 36.4 C  SpO2: 95% 95%    Last Pain:  Vitals:   11/06/20 1400  TempSrc:   PainSc: 0-No pain                 [name]

## 2020-11-06 NOTE — Anesthesia Procedure Notes (Signed)
Procedure Name: LMA Insertion Date/Time: 11/06/2020 12:41 PM Performed by: Mechele Claude, CRNA Pre-anesthesia Checklist: Patient identified, Emergency Drugs available, Suction available and Patient being monitored Patient Re-evaluated:Patient Re-evaluated prior to induction Oxygen Delivery Method: Circle System Utilized Preoxygenation: Pre-oxygenation with 100% oxygen Induction Type: IV induction Ventilation: Mask ventilation without difficulty LMA: LMA with gastric port inserted LMA Size: 4.0 Number of attempts: 1 Placement Confirmation: positive ETCO2 Tube secured with: Tape Dental Injury: Teeth and Oropharynx as per pre-operative assessment

## 2020-11-06 NOTE — Transfer of Care (Addendum)
Immediate Anesthesia Transfer of Care Note  Patient: [name]  Procedure(s) Performed: Procedure(s) (LRB): TRANSURETHRAL ABLATION OF BLADDER NECK CONTRACTURE (N/A) CYSTOSCOPY (N/A)  Patient Location: PACU  Anesthesia Type: General  Level of Consciousness:sleepy  Airway & Oxygen Therapy: Patient Spontanous Breathing and Patient connected to face mask oxygen, oral airway in place.  Post-op Assessment: Report given to PACU RN and Post -op Vital signs reviewed and stable  Post vital signs: Reviewed and stable  Complications: No apparent anesthesia complications  Last Vitals:  Vitals Value Taken Time  BP 131/77 11/06/20 1330  Temp 36.6 C 11/06/20 1326  Pulse 79 11/06/20 1334  Resp 12 11/06/20 1334  SpO2 99 % 11/06/20 1334  Vitals shown include unvalidated device data.  Last Pain:  Vitals:   11/06/20 1330  TempSrc:   PainSc: Asleep         Complications: No notable events documented.

## 2020-11-06 NOTE — Anesthesia Preprocedure Evaluation (Signed)
Anesthesia Evaluation  Patient identified by MRN, date of birth, ID band Patient awake    Reviewed: Allergy & Precautions, NPO status , Patient's Chart, lab work & pertinent test results  Airway Mallampati: II  TM Distance: >3 FB     Dental   Pulmonary asthma , sleep apnea ,    breath sounds clear to auscultation       Cardiovascular hypertension, + Peripheral Vascular Disease   Rhythm:Regular Rate:Normal     Neuro/Psych  Headaches,    GI/Hepatic Neg liver ROS, hiatal hernia, GERD  ,  Endo/Other  Hypothyroidism   Renal/GU Renal disease     Musculoskeletal   Abdominal   Peds  Hematology   Anesthesia Other Findings   Reproductive/Obstetrics                             Anesthesia Physical Anesthesia Plan  ASA: 3  Anesthesia Plan: General   Post-op Pain Management:    Induction: Intravenous  PONV Risk Score and Plan: 2 and Ondansetron, Dexamethasone and Midazolam  Airway Management Planned: LMA  Additional Equipment:   Intra-op Plan:   Post-operative Plan: Extubation in OR  Informed Consent: I have reviewed the patients History and Physical, chart, labs and discussed the procedure including the risks, benefits and alternatives for the proposed anesthesia with the patient or authorized representative who has indicated his/her understanding and acceptance.     Dental advisory given  Plan Discussed with: CRNA and Anesthesiologist  Anesthesia Plan Comments:         Anesthesia Quick Evaluation

## 2020-11-06 NOTE — Interval H&P Note (Signed)
History and Physical Interval Note:  11/06/2020 12:41 PM  [name]  has presented today for surgery, with the diagnosis of [place].  The various methods of treatment have been discussed with the patient and family. After consideration of risks, benefits and other options for treatment, the patient has consented to  Procedure(s): TRANSURETHRAL RESECTION OF BLADDER NECK CONTRACTURE (N/A) CYSTOSCOPY (N/A) as a surgical intervention.  The patient's history has been reviewed, patient examined, no change in status, stable for surgery.  I have reviewed the patient's chart and labs.  Questions were answered to the patient's satisfaction.     Remi Haggard

## 2020-11-06 NOTE — Discharge Instructions (Addendum)
Transurethral Procedure  Medications: Resume all your other meds from home  Activity: 1. No heavy lifting > 10 pounds for 2 weeks. 2. No sexual activity for 2 weeks. 3. No strenuous activity for 2 weeks. 4. No driving while on narcotic pain medications. 5. Drink plenty of water. 6. Continue to walk at home - you can still get blood clots when you are at home so keep     active but don't over do it. 7. Your urine may have some blood in it - make sure you drink plenty of water. Call or           come to the ER immediately if you catheter stops draining or you are unable to urinate.  Bathing: You can shower. You can take a bath unless you have a foley catheter in place.  Signs / Symptoms to call: 1. Call if you have a fever greater than 101.5  2. Uncontrolled nausea / vomiting, uncontrolled pain / dizziness, unable to urinate, leg         swelling / leg pain, or any other concerns.   You can reach [place] at [place] Instructions  Activity: Get plenty of rest for the remainder of the day. A responsible individual must stay with you for 24 hours following the procedure.  For the next 24 hours, DO NOT: -Drive a car -Paediatric nurse -Drink alcoholic beverages -Take any medication unless instructed by your physician -Make any legal decisions or sign important papers.  Meals: Start with liquid foods such as gelatin or soup. Progress to regular foods as tolerated. Avoid greasy, spicy, heavy foods. If nausea and/or vomiting occur, drink only clear liquids until the nausea and/or vomiting subsides. Call your physician if vomiting continues.  Special Instructions/Symptoms: Your throat may feel dry or sore from the anesthesia or the breathing tube placed in your throat during surgery. If this causes discomfort, gargle with warm salt water. The discomfort should disappear within 24 hours.

## 2020-11-06 NOTE — Op Note (Signed)
Preoperative diagnosis:  1.  Bladder neck contracture  Postoperative diagnosis: 1.  Same  Procedure(s): 1.  Cystoscopy, transurethral ablation of bladder neck contracture  Surgeon: Dr. Harold Barban  Anesthesia: General  Complications: None  EBL: Minimal  Specimens: None  Disposition of specimens: Not applicable  Intraoperative findings: Bladder neck contracture would not allow passage of 21 [place] scope.  Utilized resectoscope with the button to open and ablate bladder neck contracture circumferentially.  No bleeding noted.  64 French Foley placed at termination of case  Indication: Patient is a 87 year old Hispanic male who is undergone prior TURP.  He had progressive worsening outlet symptoms and were unresponsive to GU medications.  Cystoscopy confirmed bladder neck contracture from prior procedure.  Presents at this time to go cystoscopy and transurethral ablation of bladder neck contracture  Description of procedure:  After obtaining informed consent for the patient is taken the major OR suite and placed under general anesthesia.  Placed in the dorsolithotomy position genitalia prepped and draped in usual sterile fashion.  Proper pause and timeout was performed.  21 [place] scope scope was advanced into the urethra.  This advanced up to the level of the bladder neck and noted to have a circumferential tight bladder neck contracture which would initially not allow admission of the scope.  Utilizing gentle pressure through the bladder neck I was able to pass the scope and dilate the contracture initially.  Bladder was visualized and noted to have no mucosal lesions.  Ureteral orifice ease were well away from the bladder neck.  The prostatic urethra was otherwise widely patent to the level of the verumontanum.  The cystoscope was removed and the 26 French continuous-flow bipolar resectoscope was in place utilizing the visualized obturator without difficulty.  Utilizing the button  attachment of the loop ablation was performed starting at the bladder neck and proceeding circumferentially to widely open the bladder neck contracture.  There is minimal if any bleeding from the procedure.  At the termination of the ablation the resectoscope was easily passed into the bladder with minimal resistance.  The resectoscope was withdrawn and again hemostasis noted at the bladder neck ablation site.  71 French three-way Foley was placed with no resistance.  The effluent from the bladder was crystal-clear and a plug was placed and three-way irrigation port.  25 cc of water placed in the balloon.  Procedure was terminated he was awakened from anesthesia and taken back to the recovery room in stable condition.  No immediate complication from the procedure.

## 2020-11-07 ENCOUNTER — Encounter (HOSPITAL_BASED_OUTPATIENT_CLINIC_OR_DEPARTMENT_OTHER): Payer: Self-pay | Admitting: Urology

## 2020-11-07 LAB — POCT I-STAT, CHEM 8
BUN: 17 mg/dL (ref 8–23)
Calcium, Ion: 1.33 mmol/L (ref 1.15–1.40)
Chloride: 103 mmol/L (ref 98–111)
Creatinine, Ser: 1.3 mg/dL — ABNORMAL HIGH (ref 0.61–1.24)
Glucose, Bld: 85 mg/dL (ref 70–99)
HCT: 45 % (ref 39.0–52.0)
Hemoglobin: 15.3 g/dL (ref 13.0–17.0)
Potassium: 3.9 mmol/L (ref 3.5–5.1)
Sodium: 138 mmol/L (ref 135–145)
TCO2: 22 mmol/L (ref 22–32)

## 2020-11-29 ENCOUNTER — Other Ambulatory Visit: Payer: Self-pay | Admitting: Physician Assistant

## 2020-11-29 DIAGNOSIS — E041 Nontoxic single thyroid nodule: Secondary | ICD-10-CM

## 2020-12-10 ENCOUNTER — Ambulatory Visit
Admission: RE | Admit: 2020-12-10 | Discharge: 2020-12-10 | Disposition: A | Discharge status: Home / Self Care | Payer: Medicare HMO | Source: Ambulatory Visit | Attending: Physician Assistant | Admitting: Physician Assistant

## 2020-12-10 DIAGNOSIS — E041 Nontoxic single thyroid nodule: Secondary | ICD-10-CM

## 2020-12-18 ENCOUNTER — Other Ambulatory Visit: Payer: Self-pay | Admitting: Physician Assistant

## 2020-12-18 DIAGNOSIS — E041 Nontoxic single thyroid nodule: Secondary | ICD-10-CM

## 2020-12-26 ENCOUNTER — Other Ambulatory Visit: Payer: Medicare HMO

## 2021-06-05 ENCOUNTER — Emergency Department (HOSPITAL_COMMUNITY): Payer: Medicare HMO

## 2021-06-05 ENCOUNTER — Emergency Department (HOSPITAL_COMMUNITY)
Admission: EM | Admit: 2021-06-05 | Discharge: 2021-06-05 | Disposition: A | Discharge status: Home / Self Care | Payer: Medicare HMO | Attending: Emergency Medicine | Admitting: Emergency Medicine

## 2021-06-05 DIAGNOSIS — M5441 Lumbago with sciatica, right side: Secondary | ICD-10-CM | POA: Diagnosis not present

## 2021-06-05 DIAGNOSIS — Z7982 Long term (current) use of aspirin: Secondary | ICD-10-CM | POA: Insufficient documentation

## 2021-06-05 DIAGNOSIS — R109 Unspecified abdominal pain: Secondary | ICD-10-CM | POA: Diagnosis not present

## 2021-06-05 DIAGNOSIS — M545 Low back pain, unspecified: Secondary | ICD-10-CM | POA: Diagnosis present

## 2021-06-05 LAB — CBC WITH DIFFERENTIAL/PLATELET
Abs Immature Granulocytes: 0.1 10*3/uL — ABNORMAL HIGH (ref 0.00–0.07)
Basophils Absolute: 0 10*3/uL (ref 0.0–0.1)
Basophils Relative: 0 %
Eosinophils Absolute: 0.2 10*3/uL (ref 0.0–0.5)
Eosinophils Relative: 3 %
HCT: 43.2 % (ref 39.0–52.0)
Hemoglobin: 15.3 g/dL (ref 13.0–17.0)
Immature Granulocytes: 1 %
Lymphocytes Relative: 17 %
Lymphs Abs: 1.2 10*3/uL (ref 0.7–4.0)
MCH: 31.4 pg (ref 26.0–34.0)
MCHC: 35.4 g/dL (ref 30.0–36.0)
MCV: 88.7 fL (ref 80.0–100.0)
Monocytes Absolute: 0.5 10*3/uL (ref 0.1–1.0)
Monocytes Relative: 8 %
Neutro Abs: 4.9 10*3/uL (ref 1.7–7.7)
Neutrophils Relative %: 71 %
Platelets: 197 10*3/uL (ref 150–400)
RBC: 4.87 MIL/uL (ref 4.22–5.81)
RDW: 13.7 % (ref 11.5–15.5)
WBC: 6.9 10*3/uL (ref 4.0–10.5)
nRBC: 0 % (ref 0.0–0.2)

## 2021-06-05 LAB — COMPREHENSIVE METABOLIC PANEL
ALT: 21 U/L (ref 0–44)
AST: 26 U/L (ref 15–41)
Albumin: 4.2 g/dL (ref 3.5–5.0)
Alkaline Phosphatase: 78 U/L (ref 38–126)
Anion gap: 8 (ref 5–15)
BUN: 20 mg/dL (ref 8–23)
CO2: 23 mmol/L (ref 22–32)
Calcium: 9.2 mg/dL (ref 8.9–10.3)
Chloride: 103 mmol/L (ref 98–111)
Creatinine, Ser: 1.03 mg/dL (ref 0.61–1.24)
GFR, Estimated: >60 mL/min (ref >60)
Glucose, Bld: 109 mg/dL — ABNORMAL HIGH (ref 70–99)
Potassium: 3.7 mmol/L (ref 3.5–5.1)
Sodium: 134 mmol/L — ABNORMAL LOW (ref 135–145)
Total Bilirubin: 0.7 mg/dL (ref 0.3–1.2)
Total Protein: 7.8 g/dL (ref 6.5–8.1)

## 2021-06-05 LAB — URINALYSIS, ROUTINE W REFLEX MICROSCOPIC
Bacteria, UA: NONE SEEN
Bilirubin Urine: NEGATIVE
Glucose, UA: NEGATIVE mg/dL
Hgb urine dipstick: NEGATIVE
Ketones, ur: NEGATIVE mg/dL
Leukocytes,Ua: NEGATIVE
Nitrite: NEGATIVE
Protein, ur: 100 mg/dL — AB
Specific Gravity, Urine: 1.006 (ref 1.005–1.030)
pH: 6 (ref 5.0–8.0)

## 2021-06-05 LAB — LIPASE, BLOOD: Lipase: 61 U/L — ABNORMAL HIGH (ref 11–51)

## 2021-06-05 MED ORDER — IOHEXOL 350 MG/ML SOLN
100.0000 mL | Freq: Once | INTRAVENOUS | Status: AC | PRN
Start: 2021-06-05 — End: 2021-06-05
  Administered 2021-06-05: 100 mL via INTRAVENOUS

## 2021-06-05 MED ORDER — ONDANSETRON HCL 4 MG/2ML IJ SOLN
4.0000 mg | Freq: Once | INTRAMUSCULAR | Status: AC
  Administered 2021-06-05: 4 mg via INTRAVENOUS
  Filled 2021-06-05: qty 2

## 2021-06-05 MED ORDER — FENTANYL CITRATE PF 50 MCG/ML IJ SOSY
50.0000 ug | PREFILLED_SYRINGE | Freq: Once | INTRAMUSCULAR | Status: AC
  Administered 2021-06-05: 50 ug via INTRAVENOUS
  Filled 2021-06-05: qty 1

## 2021-06-05 MED ORDER — OXYCODONE HCL 5 MG PO TABS: 5.0000 mg | ORAL_TABLET | Freq: Four times a day (QID) | ORAL | 0 refills | Status: AC | PRN

## 2021-06-05 MED ORDER — LIDOCAINE 5 % EX PTCH: 1.0000 | MEDICATED_PATCH | CUTANEOUS | 0 refills | Status: AC

## 2021-06-05 MED ORDER — SODIUM CHLORIDE 0.9 % IV BOLUS
500.0000 mL | Freq: Once | INTRAVENOUS | Status: AC
  Administered 2021-06-05: 500 mL via INTRAVENOUS

## 2021-06-05 NOTE — ED Triage Notes (Signed)
Pt comes in for lower back pain. Pt states the pain has become worse over the past 4 days. Pt denies injury. ?

## 2021-06-05 NOTE — Discharge Instructions (Addendum)
Follow-up with your neurosurgeon ? ?Take the medications as prescribed.  They may make you sleepy.  Only take as needed. ? ?Return for new or worsening symptoms ?

## 2021-06-05 NOTE — ED Provider Notes (Signed)
[facility] DEPT Provider Note   CSN: [id number] Arrival date & time: 06/05/21  1453     History  Chief Complaint  Patient presents with   Back Pain    [name] is a 88 y.o. male history of chronic back pain, chronic tremors, memory loss, on Aricept here for evaluation of back pain, right flank pain.  History of chronic back pain has balance issues due to chronic burning in his legs and feet with weakness and pain.  Followed by pain management had injections however only helped for approximately 4 days previously. Referred to neurosurgery from neurology few days ago.  EMG 2 weeks ago at neurology showed subacute to chronic right L5 and S1 radiculopathy without peripheral neuropathy.  He gets lumbar injections with [facility].  Right flank, right lower back pain worsening last 4 days.  Worse with any sort of movement, lying flat.  No rashes.  He has chronic numbness and weakness in the lower extremities.  Did have some constipation a few days ago.  No bowel or bladder incontinence.  He denies any saddle paresthesia.  MRI in November showed lumbar herniated disc.  He denies fever, nausea, vomiting, chest pain, shortness of breath, cough, dysuria, hematuria.  No history of kidney stones.  Not taking any medications at home.  No history of AAA, dissection. No melena or BRBPR.      HPI     Home Medications Prior to Admission medications   Medication Sig Start Date End Date Taking? Authorizing Provider  lidocaine (LIDODERM) 5 % Place 1 patch onto the skin daily. Remove & Discard patch within 12 hours or as directed by MD 06/05/21  Yes Vallen Calabrese A, PA-C  oxyCODONE (ROXICODONE) 5 MG immediate release tablet Take 1 tablet (5 mg total) by mouth every 6 (six) hours as needed for severe pain. 06/05/21  Yes Naaman Curro A, PA-C  acetaminophen (TYLENOL) 500 MG tablet Take 500 mg by mouth every 6 (six) hours as needed.    [provider]  albuterol (VENTOLIN HFA) 108 (90 Base) MCG/ACT inhaler Inhale 2 puffs into the lungs every 6 (six) hours as needed for shortness of breath. 01/11/19   [provider]  amLODipine (NORVASC) 10 MG tablet Take 5 mg by mouth daily. 03/18/19   [provider]  aspirin EC 81 MG tablet Take 81 mg by mouth daily.    [provider]  budesonide-formoterol (SYMBICORT) 160-4.5 MCG/ACT inhaler Inhale 2 puffs into the lungs in the morning and at bedtime. 11/21/19   [provider]  diphenhydrAMINE (BENADRYL) 25 MG tablet Take 25 mg by mouth at bedtime.    [provider]  escitalopram (LEXAPRO) 20 MG tablet Take 20 mg by mouth at bedtime.    [provider]  esomeprazole (NEXIUM) 40 MG capsule Take 40 mg by mouth daily. 07/28/16   [provider]  Fenofibrate 150 MG CAPS Take 1 capsule by mouth daily.    [provider]  gabapentin (NEURONTIN) 300 MG capsule Take 300 mg by mouth 2 (two) times daily.    [provider]  levothyroxine (SYNTHROID, LEVOTHROID) 100 MCG tablet Take 100 mcg by mouth daily before breakfast.    [provider]  Melatonin 10 MG TABS Take 10 mg by mouth at bedtime.    [provider]  mirabegron ER (MYRBETRIQ) 50 MG TB24 tablet Take 50 mg by mouth at bedtime.    [provider]  prednisoLONE  acetate (PRED FORTE) 1 % ophthalmic suspension Place 1 drop into the right eye in the morning and at bedtime.     [provider]  primidone (MYSOLINE) 50 MG tablet Take 50-100 mg by mouth daily. Per pt one to two tab daily    [provider]  rosuvastatin (CRESTOR) 5 MG tablet Take 5 mg by mouth daily. 12/17/19   [provider]  timolol (TIMOPTIC) 0.5 % ophthalmic solution Place 1 drop into the right eye daily. 04/11/15   [provider]      Allergies    Hydrocodone    Review of Systems   Review of Systems  Constitutional: Negative.   HENT: Negative.     Respiratory: Negative.    Cardiovascular: Negative.   Gastrointestinal: Negative.   Genitourinary:  Positive for flank pain. Negative for decreased urine volume, difficulty urinating, dysuria, enuresis, frequency, genital sores, hematuria, penile discharge, penile swelling, scrotal swelling, testicular pain and urgency.  Musculoskeletal:  Positive for back pain and gait problem (chronic). Negative for neck pain and neck stiffness.  Skin: Negative.   Neurological:  Positive for tremors (chronic) and numbness (chronic).  All other systems reviewed and are negative.  Physical Exam Updated Vital Signs BP 134/84    Pulse 79    Temp 97.6 F (36.4 C)    Resp 19    Ht '5\' 5"'$  (1.651 m)    Wt 75.3 kg    SpO2 97%    BMI 27.62 kg/m  Physical Exam Vitals and nursing note reviewed.  Constitutional:      General: He is not in acute distress.    Appearance: He is well-developed. He is not ill-appearing, toxic-appearing or diaphoretic.  HENT:     Head: Normocephalic and atraumatic.     Nose: Nose normal.     Mouth/Throat:     Mouth: Mucous membranes are moist.  Eyes:     Pupils: Pupils are equal, round, and reactive to light.  Cardiovascular:     Rate and Rhythm: Normal rate and regular rhythm.     Pulses: Normal pulses.          Femoral pulses are 2+ on the right side and 2+ on the left side.      Dorsalis pedis pulses are 2+ on the right side and 2+ on the left side.       Posterior tibial pulses are 2+ on the right side and 2+ on the left side.     Heart sounds: Normal heart sounds.  Pulmonary:     Effort: Pulmonary effort is normal. No respiratory distress.     Breath sounds: Normal breath sounds.  Abdominal:     General: Bowel sounds are normal. There is no distension.     Palpations: Abdomen is soft.     Tenderness: There is abdominal tenderness. There is right CVA tenderness. There is no left CVA tenderness, guarding or rebound.     Comments: Tenderness to right CVA pleasantly.  No  overlying skin changes.  Mild tenderness to right lower abdomen.  No rebound or guarding.  Musculoskeletal:        General: Normal range of motion.     Cervical back: Normal range of motion and neck supple.     Comments: No midline C/T/L tenderness.  Negative straight leg raise bilaterally.  Compartments are soft.  No bony tenderness to lower extremities.  Skin:    General: Skin is warm and dry.  Neurological:     General:  No focal deficit present.     Mental Status: He is alert and oriented to person, place, and time.     Cranial Nerves: Cranial nerves 2-12 are intact.     Sensory: Sensation is intact.     Motor: Tremor present. No weakness, atrophy, abnormal muscle tone, seizure activity or pronator drift.     Deep Tendon Reflexes: Reflexes are normal and symmetric.     Comments: Slight tremor to bilateral upper extremities at baseline per family Not ataxic gait however does walk hunched over DTRs within normal limits bilaterally    ED Results / Procedures / Treatments   Labs (all labs ordered are listed, but only abnormal results are displayed) Labs Reviewed  CBC WITH DIFFERENTIAL/PLATELET - Abnormal; Notable for the following components:      Result Value   Abs Immature Granulocytes 0.10 (*)    All other components within normal limits  COMPREHENSIVE METABOLIC PANEL - Abnormal; Notable for the following components:   Sodium 134 (*)    Glucose, Bld 109 (*)    All other components within normal limits  URINALYSIS, ROUTINE W REFLEX MICROSCOPIC - Abnormal; Notable for the following components:   Color, Urine STRAW (*)    Protein, ur 100 (*)    All other components within normal limits  LIPASE, BLOOD - Abnormal; Notable for the following components:   Lipase 61 (*)    All other components within normal limits    EKG None  Radiology CT Angio Abd/Pel W and/or Wo Contrast  Result Date: 06/05/2021 CLINICAL DATA:  Abdominal pain and back pain, numbness radiating to the legs.  EXAM: CTA ABDOMEN AND PELVIS WITHOUT AND WITH CONTRAST TECHNIQUE: Multidetector CT imaging of the abdomen and pelvis was performed using the standard protocol during bolus administration of intravenous contrast. Multiplanar reconstructed images and MIPs were obtained and reviewed to evaluate the vascular anatomy. RADIATION DOSE REDUCTION: This exam was performed according to the departmental dose-optimization program which includes automated exposure control, adjustment of the mA and/or kV according to patient size and/or use of iterative reconstruction technique. CONTRAST:  141m OMNIPAQUE IOHEXOL 350 MG/ML SOLN COMPARISON:  CT abdomen and pelvis 12/17/2016. FINDINGS: VASCULAR Aorta: Normal caliber aorta without aneurysm, dissection, vasculitis or significant stenosis. There is calcified and noncalcified atherosclerotic disease in the abdominal aorta. Celiac: Patent without evidence of aneurysm, dissection, vasculitis or significant stenosis. SMA: Patent without evidence of aneurysm, dissection, vasculitis or significant stenosis. Renals: Both renal arteries are patent without evidence of aneurysm, dissection, vasculitis, fibromuscular dysplasia or significant stenosis. Accessory right renal artery noted. IMA: Patent without evidence of aneurysm, dissection, vasculitis or significant stenosis. Inflow: Patent without evidence of aneurysm, dissection, vasculitis or significant stenosis. Proximal Outflow: Bilateral common femoral and visualized portions of the superficial and profunda femoral arteries are patent without evidence of aneurysm, dissection, vasculitis or significant stenosis. Veins: No obvious venous abnormality within the limitations of this arterial phase study. Review of the MIP images confirms the above findings. NON-VASCULAR Lower chest: No acute abnormality. Hepatobiliary: No focal liver abnormality is seen. Status post cholecystectomy. No biliary dilatation. Pancreas: Unremarkable. No pancreatic  ductal dilatation or surrounding inflammatory changes. Spleen: Normal in size without focal abnormality. Adrenals/Urinary Tract: Complex low-attenuation/cystic lesion in the left kidney measures 4.8 x 2.9 cm it has slightly increased in size. Septations are present. Additional rounded hypodensities in both kidneys are too small to characterize, likely cysts. There is no hydronephrosis or perinephric fat stranding. The adrenal glands and bladder are within normal limits.  Stomach/Bowel: Stomach is within normal limits. Appendix appears normal. No evidence of bowel wall thickening, distention, or inflammatory changes. Lymphatic: Aortic atherosclerosis. No enlarged abdominal or pelvic lymph nodes. Reproductive: Prostate gland is enlarged, unchanged. Other: There is a small fat containing umbilical hernia. There is no free fluid or free air. There also small fat containing bilateral inguinal hernias. Musculoskeletal: Multilevel degenerative changes affect the spine. IMPRESSION: VASCULAR 1. No acute vascular pathology in the abdomen or pelvis. 2.  Aortic Atherosclerosis (ICD10-I70.0). NON-VASCULAR 1. No acute localizing process in the abdomen or pelvis. 2. Complex cystic lesion in the left kidney has increased in size. Recommend further characterization with MRI. 3. Prostatomegaly. Electronically Signed   By: Ronney Asters M.D.   On: 06/05/2021 17:49    Procedures Procedures    Medications Ordered in ED Medications  sodium chloride 0.9 % bolus 500 mL (0 mLs Intravenous Stopped 06/05/21 1652)  ondansetron (ZOFRAN) injection 4 mg (4 mg Intravenous Given 06/05/21 1614)  fentaNYL (SUBLIMAZE) injection 50 mcg (50 mcg Intravenous Given 06/05/21 1615)  iohexol (OMNIPAQUE) 350 MG/ML injection 100 mL (100 mLs Intravenous Contrast Given 06/05/21 1710)    ED Course/ Medical Decision Making/ A&P    88 year old here for evaluation of back pain.  Has history of chronic back pain with chronic lower extremity numbness, weakness  and lower extremity pain.  Followed by neurology, pain management, I reviewed his recent records, recently seen by neurology also has some memory issues and chronic tremors bilaterally.  He is recently referred to neurosurgery.  Apparently pain has worsened over the last 4 days.  Does admit to being constipated.  He denies any bowel or bladder incontinence, saddle paresthesia.  Pain worse with movement.  When he points to locate his pain he points to his right flank and right lower abdomen.  He denies any dysuria, hematuria, history of kidney stones.  He moves currently in bed without difficulty.  No chest pain, shortness of breath, cough to suggest intrathoracic etiology of his flank pain.  Patient neurovascularly intact, compartments soft.  No bony tenderness lower extremities.  No clinical evidence of VTE.  He has 2+ femoral, DP, PT pulses.  Lower extremities appear well-perfused.  Plan on labs, imaging and reassess  Labs and imaging personally viewed and interpreted: CBC without leukocytosis, hgb 15.3 Lipase 61 CMP UA neg for infection EKG without ischemic changes CTA A/P without acute changes  Patient reassessed.  Pain improved.  Suspect acute on chronic pain.  At this time I low suspicion for acute neurosurgical emergency such as cauda equina, discitis, osteomyelitis, transverse polyps, psoas abscess, AAA, dissection, mesenteric ischemia, perforated viscus, bowel obstruction, incarceration, infectious process, ischemia, venous thrombosis, PE, PNTX, shingles.  DC home with short course of pain medicine, he has follow-up appointment with neurosurgery/ pain management in 2 days daughter in room, patient continues to be neurovascularly intact, ambulatory that difficulty  The patient has been appropriately medically screened and/or stabilized in the ED. I have low suspicion for any other emergent medical condition which would require further screening, evaluation or treatment in the ED or require  inpatient management.  Patient is hemodynamically stable and in no acute distress.  Patient able to ambulate in department prior to ED.  Evaluation does not show acute pathology that would require ongoing or additional emergent interventions while in the emergency department or further inpatient treatment.  I have discussed the diagnosis with the patient and answered all questions.  Pain is been managed while in the  emergency department and patient has no further complaints prior to discharge.  Patient is comfortable with plan discussed in room and is stable for discharge at this time.  I have discussed strict return precautions for returning to the emergency department.  Patient was encouraged to follow-up with PCP/specialist refer to at discharge.                            Medical Decision Making Amount and/or Complexity of Data Reviewed Independent Historian: caregiver    Details: daughter in room External Data Reviewed: labs, radiology, ECG and notes.    Details: [facility], Neuro notes, Pain managment Labs: ordered. Decision-making details documented in ED Course. Radiology: ordered and independent interpretation performed. Decision-making details documented in ED Course. ECG/medicine tests: ordered and independent interpretation performed. Decision-making details documented in ED Course.  Risk OTC drugs. Prescription drug management. Parenteral controlled substances.          Final Clinical Impression(s) / ED Diagnoses Final diagnoses:  Acute right-sided low back pain with bilateral sciatica    Rx / DC Orders ED Discharge Orders          Ordered    oxyCODONE (ROXICODONE) 5 MG immediate release tablet  Every 6 hours PRN        06/05/21 1827    lidocaine (LIDODERM) 5 %  Every 24 hours        06/05/21 1827              Havannah Streat A, PA-C 06/05/21 1829    Gareth Morgan, MD 06/06/21 1101

## 2021-07-02 ENCOUNTER — Encounter (HOSPITAL_COMMUNITY): Payer: Self-pay | Admitting: Radiology

## 2022-01-07 ENCOUNTER — Emergency Department (HOSPITAL_COMMUNITY): Payer: Medicare HMO

## 2022-01-07 ENCOUNTER — Emergency Department (HOSPITAL_COMMUNITY)
Admission: EM | Admit: 2022-01-07 | Discharge: 2022-01-07 | Disposition: A | Discharge status: Home / Self Care | Payer: Medicare HMO | Attending: Emergency Medicine | Admitting: Emergency Medicine

## 2022-01-07 ENCOUNTER — Encounter (HOSPITAL_COMMUNITY): Payer: Self-pay

## 2022-01-07 DIAGNOSIS — R109 Unspecified abdominal pain: Secondary | ICD-10-CM

## 2022-01-07 DIAGNOSIS — N289 Disorder of kidney and ureter, unspecified: Secondary | ICD-10-CM | POA: Diagnosis not present

## 2022-01-07 DIAGNOSIS — K5900 Constipation, unspecified: Secondary | ICD-10-CM | POA: Insufficient documentation

## 2022-01-07 DIAGNOSIS — R911 Solitary pulmonary nodule: Secondary | ICD-10-CM | POA: Insufficient documentation

## 2022-01-07 DIAGNOSIS — Z7982 Long term (current) use of aspirin: Secondary | ICD-10-CM | POA: Diagnosis not present

## 2022-01-07 DIAGNOSIS — R918 Other nonspecific abnormal finding of lung field: Secondary | ICD-10-CM

## 2022-01-07 LAB — CBC
HCT: 40.9 % (ref 39.0–52.0)
Hemoglobin: 13.9 g/dL (ref 13.0–17.0)
MCH: 30.8 pg (ref 26.0–34.0)
MCHC: 34 g/dL (ref 30.0–36.0)
MCV: 90.7 fL (ref 80.0–100.0)
Platelets: 183 10*3/uL (ref 150–400)
RBC: 4.51 MIL/uL (ref 4.22–5.81)
RDW: 13.5 % (ref 11.5–15.5)
WBC: 7 10*3/uL (ref 4.0–10.5)
nRBC: 0 % (ref 0.0–0.2)

## 2022-01-07 LAB — COMPREHENSIVE METABOLIC PANEL
ALT: 19 U/L (ref 0–44)
AST: 19 U/L (ref 15–41)
Albumin: 4.1 g/dL (ref 3.5–5.0)
Alkaline Phosphatase: 72 U/L (ref 38–126)
Anion gap: 8 (ref 5–15)
BUN: 21 mg/dL (ref 8–23)
CO2: 23 mmol/L (ref 22–32)
Calcium: 9.3 mg/dL (ref 8.9–10.3)
Chloride: 107 mmol/L (ref 98–111)
Creatinine, Ser: 1.12 mg/dL (ref 0.61–1.24)
GFR, Estimated: >60 mL/min (ref >60)
Glucose, Bld: 102 mg/dL — ABNORMAL HIGH (ref 70–99)
Potassium: 3.7 mmol/L (ref 3.5–5.1)
Sodium: 138 mmol/L (ref 135–145)
Total Bilirubin: 0.8 mg/dL (ref 0.3–1.2)
Total Protein: 7.2 g/dL (ref 6.5–8.1)

## 2022-01-07 LAB — URINALYSIS, ROUTINE W REFLEX MICROSCOPIC
Bilirubin Urine: NEGATIVE
Glucose, UA: NEGATIVE mg/dL
Hgb urine dipstick: NEGATIVE
Ketones, ur: NEGATIVE mg/dL
Leukocytes,Ua: NEGATIVE
Nitrite: NEGATIVE
Protein, ur: 100 mg/dL — AB
Specific Gravity, Urine: 1.012 (ref 1.005–1.030)
pH: 6 (ref 5.0–8.0)

## 2022-01-07 LAB — LIPASE, BLOOD: Lipase: 34 U/L (ref 11–51)

## 2022-01-07 MED ORDER — IOHEXOL 300 MG/ML  SOLN
100.0000 mL | Freq: Once | INTRAMUSCULAR | Status: AC | PRN
  Administered 2022-01-07: 100 mL via INTRAVENOUS

## 2022-01-07 MED ORDER — BISACODYL 5 MG PO TBEC: 10.0000 mg | DELAYED_RELEASE_TABLET | Freq: Every day | ORAL | 0 refills | Status: AC | PRN

## 2022-01-07 MED ORDER — MINERAL OIL RE ENEM: 1.0000 | ENEMA | Freq: Once | RECTAL | 2 refills | Status: AC | PRN

## 2022-01-07 NOTE — Discharge Instructions (Signed)
Please call your doctor's office to make a follow-up appointment.  Your CT scan showed that you have some constipation and stool in your colon.  You can use laxatives as prescribed to help with this.  Drink plenty of water.  Your CT scan also showed that you have a complicated cyst on your kidneys.  This may be a sign of kidney cancer.  Your doctor will need to follow-up on this.  You may need an MRI of your kidneys to look at this better.    Finally you have some nodules in your lungs.  These can also be incidental.  Follow-up with your doctor for this issue as you will likely need a repeat CT scan in 6 to 12 months.

## 2022-01-07 NOTE — ED Provider Notes (Signed)
[facility] DEPT Provider Note   CSN: [id number] Arrival date & time: 01/07/22  1400     History  Chief Complaint  Patient presents with   Constipation    [name] is a 88 y.o. male presenting to ED with complaint of constipation and bladder fullness.  Patient reports has not had a bowel movement in 4 days.  He typically is a bowel movement every other day.  He takes MiraLAX chronically for constipation and says he is very sensitive to variety of medications may be worsening this.  He denies nausea, vomiting, bloating.  He does report he had lower abdominal pain which is cramping and worsening, also reports he had only small amount of urination earlier today, and has a history of an enlarged prostate.  He is having a spinal stimulator placed tomorrow to help with his urinary incontinence and hesitation issues.    HPI     Home Medications Prior to Admission medications   Medication Sig Start Date End Date Taking? Authorizing Provider  bisacodyl (DULCOLAX) 5 MG EC tablet Take 2 tablets (10 mg total) by mouth daily as needed for up to 10 doses for moderate constipation. 01/07/22  Yes Deniss Wormley, Carola Rhine, MD  mineral oil enema Place 133 mLs (1 enema total) rectally once as needed for up to 1 dose for severe constipation. 01/07/22  Yes Bibiana Gillean, Carola Rhine, MD  acetaminophen (TYLENOL) 500 MG tablet Take 500 mg by mouth every 6 (six) hours as needed.    [provider]  albuterol (VENTOLIN HFA) 108 (90 Base) MCG/ACT inhaler Inhale 2 puffs into the lungs every 6 (six) hours as needed for shortness of breath. 01/11/19   [provider]  amLODipine (NORVASC) 10 MG tablet Take 5 mg by mouth daily. 03/18/19   [provider]  aspirin EC 81 MG tablet Take 81 mg by mouth daily.    [provider]  budesonide-formoterol (SYMBICORT) 160-4.5 MCG/ACT inhaler Inhale 2 puffs into the lungs in the morning and at bedtime. 11/21/19    [provider]  diphenhydrAMINE (BENADRYL) 25 MG tablet Take 25 mg by mouth at bedtime.    [provider]  escitalopram (LEXAPRO) 20 MG tablet Take 20 mg by mouth at bedtime.    [provider]  esomeprazole (NEXIUM) 40 MG capsule Take 40 mg by mouth daily. 07/28/16   [provider]  Fenofibrate 150 MG CAPS Take 1 capsule by mouth daily.    [provider]  gabapentin (NEURONTIN) 300 MG capsule Take 300 mg by mouth 2 (two) times daily.    [provider]  levothyroxine (SYNTHROID, LEVOTHROID) 100 MCG tablet Take 100 mcg by mouth daily before breakfast.    [provider]  lidocaine (LIDODERM) 5 % Place 1 patch onto the skin daily. Remove & Discard patch within 12 hours or as directed by MD 06/05/21   Henderly, Britni A, PA-C  Melatonin 10 MG TABS Take 10 mg by mouth at bedtime.    [provider]  mirabegron ER (MYRBETRIQ) 50 MG TB24 tablet Take 50 mg by mouth at bedtime.    [provider]  oxyCODONE (ROXICODONE) 5 MG immediate release tablet Take 1 tablet (5 mg total) by mouth every 6 (six) hours as needed for severe pain. 06/05/21   Henderly, Britni A, PA-C  prednisoLONE acetate (PRED FORTE) 1 % ophthalmic suspension Place 1 drop into the right eye in the morning and at bedtime.     [provider]  primidone (MYSOLINE) 50 MG tablet Take 50-100 mg by mouth daily. Per pt one to two tab daily    [provider]  rosuvastatin (CRESTOR) 5 MG tablet Take 5 mg by mouth daily. 12/17/19   [provider]  timolol (TIMOPTIC) 0.5 % ophthalmic solution Place 1 drop into the right eye daily. 04/11/15   [provider]      Allergies    Hydrocodone    Review of Systems   Review of Systems  Physical Exam Updated Vital Signs BP 134/74   Pulse 84   Temp 97.8 F (36.6 C) (Oral)   Resp 20   SpO2 96%  Physical Exam Constitutional:      General: He is not in acute distress. HENT:      Head: Normocephalic and atraumatic.  Eyes:     Conjunctiva/sclera: Conjunctivae normal.     Pupils: Pupils are equal, round, and reactive to light.  Cardiovascular:     Rate and Rhythm: Normal rate and regular rhythm.  Pulmonary:     Effort: Pulmonary effort is normal. No respiratory distress.  Abdominal:     Tenderness: There is abdominal tenderness in the right lower quadrant, suprapubic area and left lower quadrant.  Genitourinary:    Comments: GU exam performed with nurse chaperone at the bedside, soft stool within the rectal vault, no rectal fissure Skin:    General: Skin is warm and dry.  Neurological:     General: No focal deficit present.     Mental Status: He is alert. Mental status is at baseline.  Psychiatric:        Mood and Affect: Mood normal.        Behavior: Behavior normal.     ED Results / Procedures / Treatments   Labs (all labs ordered are listed, but only abnormal results are displayed) Labs Reviewed  COMPREHENSIVE METABOLIC PANEL - Abnormal; Notable for the following components:      Result Value   Glucose, Bld 102 (*)    All other components within normal limits  URINALYSIS, ROUTINE W REFLEX MICROSCOPIC - Abnormal; Notable for the following components:   Protein, ur 100 (*)    Bacteria, UA RARE (*)    All other components within normal limits  LIPASE, BLOOD  CBC    EKG None  Radiology CT ABDOMEN PELVIS W CONTRAST  Result Date: 01/07/2022 CLINICAL DATA:  Acute nonlocalized abdominal pain, history stage III chronic kidney disease, prostate cancer EXAM: CT ABDOMEN AND PELVIS WITH CONTRAST TECHNIQUE: Multidetector CT imaging of the abdomen and pelvis was performed using the standard protocol following bolus administration of intravenous contrast. RADIATION DOSE REDUCTION: This exam was performed according to the departmental dose-optimization program which includes automated exposure control, adjustment of the mA and/or kV according to patient size  and/or use of iterative reconstruction technique. CONTRAST:  181m OMNIPAQUE IOHEXOL 300 MG/ML  SOLN COMPARISON:  06/05/2021 FINDINGS: Lower chest: Nodular foci RIGHT lower lobe 5 mm and 6 mm unchanged. Remaining lung bases clear. Hepatobiliary: Gallbladder surgically absent. Focal fatty infiltration of liver adjacent to gallbladder fossa. Remainder of liver shows mild diffuse fatty infiltration without additional mass. Pancreas: Normal appearance Spleen: Normal appearance Adrenals/Urinary Tract: Adrenal glands normal appearance. Small inferior pole RIGHT renal cyst without additional meter renal mass or hydronephrosis. Multiple LEFT renal cysts largest peripelvic cyst 2.8 cm diameter image 30. Additional mildly complicated cystic lesion posterior mid LEFT kidney 2.6 x 2.6 cm containing questionable internal enhancement/septations. No urinary tract  calcification or dilatation. Anterior wall bladder diverticulum, remainder of bladder unremarkable. Stomach/Bowel: Increased stool in rectum and sigmoid colon, less in remainder cough colon. Normal appendix. Stomach decompressed. Small bowel loops unremarkable. Vascular/Lymphatic: Atherosclerotic calcifications aorta and iliac arteries without aneurysm. No adenopathy. Reproductive: Minimal prostatic enlargement Other: Small BILATERAL inguinal hernias containing fat. No free air or free fluid. Tiny umbilical hernia containing fat. Musculoskeletal: Bones unremarkable. IMPRESSION: Increased stool in rectum and sigmoid colon, less in remainder of colon. Small BILATERAL inguinal hernias and small umbilical hernia containing fat. Mild fatty infiltration of liver. Mildly complicated cystic lesion posterior mid LEFT kidney 2.6 x 2.6 cm containing questionable internal enhancement/septations; this could represent a complex cyst or cystic neoplasm; recommend follow-up MR characterization recommended. Minimal prostatic enlargement. Stable small RIGHT lower lobe pulmonary nodules; in  this patient with a history of prostate cancer, follow-up imaging recommended in 12 months to demonstrate long-term stability. No acute intra-abdominal or intrapelvic abnormalities. Aortic Atherosclerosis (ICD10-I70.0). Electronically Signed   By: Lavonia Dana M.D.   On: 01/07/2022 16:35    Procedures Procedures    Medications Ordered in ED Medications  iohexol (OMNIPAQUE) 300 MG/ML solution 100 mL (100 mLs Intravenous Contrast Given 01/07/22 1623)    ED Course/ Medical Decision Making/ A&P Clinical Course as of 01/07/22 1700  Tue Jan 07, 2022  1655 Patient CT does not show any acute abnormalities but he does have a small stool ball.  I suspect to be able to pass this on his own with a rectal suppository and/or enema.  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  He is otherwise comfortable going home.  His granddaughter will take a month [MT]  1658 Patient is aware of cystic lesions on the kidney and will follow-up with his doctor for this, but was already aware of that beforehand.  Also incidental pulmonary nodules.  PCP can follow-up on both of these issues [MT]    Clinical Course User Index [MT] Lorelie Biermann, Carola Rhine, MD                           Medical Decision Making Risk OTC drugs. Prescription drug management.   This patient presents to the ED with concern for lower abdominal pain, constipation. This involves an extensive number of treatment options, and is a complaint that carries with it a high risk of complications and morbidity.  The differential diagnosis includes functional constipation versus colitis versus bladder distention versus other  Bladder scan shows 150 cc approximately of urine in the bladder.  Additional history obtained from patient's granddaughter at the bedside  I ordered and personally interpreted labs.  The pertinent results  include: Labs are unremarkable.  No leukocytosis.  UA without evidence of infection.  I ordered imaging studies including CT of the abdomen pelvis with contrast I independently visualized and interpreted imaging which showed complex left renal cyst, pulmonary nodules, stool in colon I agree with the radiologist interpretation  The patient was maintained on a cardiac monitor.  I personally viewed and interpreted the cardiac monitored which showed an underlying rhythm of: Sinus rhythm  I have reviewed the patients home medicines and have made adjustments as needed  Test Considered: Low suspicion for acute testicular torsion do not feel that ultrasound is indicated at this time  After the interventions noted above, I reevaluated  the patient and found that they have: stayed the same  Patient is presentation have a low suspicion for AAA, sepsis, perforated bowel, testicular torsion, or bowel obstruction.   Dispostion:  After consideration of the diagnostic results and the patients response to treatment, I feel that the patent would benefit from outpatient PCP f/u.         Final Clinical Impression(s) / ED Diagnoses Final diagnoses:  Constipation, unspecified constipation type  Abdominal pain, unspecified abdominal location  Renal lesion  Pulmonary nodules    Rx / DC Orders ED Discharge Orders          Ordered    bisacodyl (DULCOLAX) 5 MG EC tablet  Daily PRN        01/07/22 1658    mineral oil enema  Once PRN       Note to Pharmacy: Can provide alternative enema such as fleet if this product is not avaiable   01/07/22 1658              Jaziyah Gradel, Carola Rhine, MD 01/07/22 1700

## 2022-01-07 NOTE — ED Triage Notes (Signed)
Pt arrived via POV, c/o constipation x4 days. Has taken different laxatives with no relief. C/o lower abd pain and testicular pain.  Pt refused use of translator in triage, requested family translated.

## 2024-02-02 ENCOUNTER — Encounter (HOSPITAL_COMMUNITY): Payer: Self-pay

## 2024-02-02 ENCOUNTER — Other Ambulatory Visit: Payer: Self-pay

## 2024-02-02 ENCOUNTER — Emergency Department (HOSPITAL_COMMUNITY)

## 2024-02-02 ENCOUNTER — Observation Stay (HOSPITAL_COMMUNITY)
Admission: EM | Admit: 2024-02-02 | Discharge: 2024-02-03 | Disposition: A | Attending: Family Medicine | Admitting: Family Medicine

## 2024-02-02 DIAGNOSIS — Z7982 Long term (current) use of aspirin: Secondary | ICD-10-CM | POA: Insufficient documentation

## 2024-02-02 DIAGNOSIS — R55 Syncope and collapse: Secondary | ICD-10-CM | POA: Diagnosis not present

## 2024-02-02 DIAGNOSIS — K219 Gastro-esophageal reflux disease without esophagitis: Secondary | ICD-10-CM | POA: Diagnosis not present

## 2024-02-02 DIAGNOSIS — E785 Hyperlipidemia, unspecified: Secondary | ICD-10-CM | POA: Diagnosis not present

## 2024-02-02 DIAGNOSIS — Z79899 Other long term (current) drug therapy: Secondary | ICD-10-CM | POA: Diagnosis not present

## 2024-02-02 DIAGNOSIS — N179 Acute kidney failure, unspecified: Principal | ICD-10-CM | POA: Diagnosis present

## 2024-02-02 DIAGNOSIS — R42 Dizziness and giddiness: Secondary | ICD-10-CM | POA: Diagnosis present

## 2024-02-02 DIAGNOSIS — E039 Hypothyroidism, unspecified: Secondary | ICD-10-CM | POA: Diagnosis not present

## 2024-02-02 DIAGNOSIS — I10 Essential (primary) hypertension: Secondary | ICD-10-CM | POA: Diagnosis not present

## 2024-02-02 DIAGNOSIS — N400 Enlarged prostate without lower urinary tract symptoms: Secondary | ICD-10-CM | POA: Diagnosis not present

## 2024-02-02 DIAGNOSIS — E860 Dehydration: Principal | ICD-10-CM | POA: Insufficient documentation

## 2024-02-02 DIAGNOSIS — Z8546 Personal history of malignant neoplasm of prostate: Secondary | ICD-10-CM | POA: Insufficient documentation

## 2024-02-02 DIAGNOSIS — G4733 Obstructive sleep apnea (adult) (pediatric): Secondary | ICD-10-CM | POA: Diagnosis present

## 2024-02-02 LAB — URINALYSIS, ROUTINE W REFLEX MICROSCOPIC
Bacteria, UA: NONE SEEN
Bilirubin Urine: NEGATIVE
Glucose, UA: NEGATIVE mg/dL
Hgb urine dipstick: NEGATIVE
Ketones, ur: NEGATIVE mg/dL
Leukocytes,Ua: NEGATIVE
Nitrite: NEGATIVE
Protein, ur: 100 mg/dL — AB
Specific Gravity, Urine: 1.016 (ref 1.005–1.030)
pH: 5 (ref 5.0–8.0)

## 2024-02-02 LAB — COMPREHENSIVE METABOLIC PANEL WITH GFR
ALT: 13 U/L (ref 0–44)
AST: 24 U/L (ref 15–41)
Albumin: 4 g/dL (ref 3.5–5.0)
Alkaline Phosphatase: 82 U/L (ref 38–126)
Anion gap: 11 (ref 5–15)
BUN: 31 mg/dL — ABNORMAL HIGH (ref 8–23)
CO2: 21 mmol/L — ABNORMAL LOW (ref 22–32)
Calcium: 9.1 mg/dL (ref 8.9–10.3)
Chloride: 108 mmol/L (ref 98–111)
Creatinine, Ser: 1.76 mg/dL — ABNORMAL HIGH (ref 0.61–1.24)
GFR, Estimated: 37 mL/min — ABNORMAL LOW (ref >60)
Glucose, Bld: 92 mg/dL (ref 70–99)
Potassium: 4.5 mmol/L (ref 3.5–5.1)
Sodium: 140 mmol/L (ref 135–145)
Total Bilirubin: 0.8 mg/dL (ref 0.0–1.2)
Total Protein: 6.9 g/dL (ref 6.5–8.1)

## 2024-02-02 LAB — CBC
HCT: 42.4 % (ref 39.0–52.0)
Hemoglobin: 14.6 g/dL (ref 13.0–17.0)
MCH: 31.4 pg (ref 26.0–34.0)
MCHC: 34.4 g/dL (ref 30.0–36.0)
MCV: 91.2 fL (ref 80.0–100.0)
Platelets: 150 K/uL (ref 150–400)
RBC: 4.65 MIL/uL (ref 4.22–5.81)
RDW: 14.4 % (ref 11.5–15.5)
WBC: 7.2 K/uL (ref 4.0–10.5)
nRBC: 0 % (ref 0.0–0.2)

## 2024-02-02 LAB — CBG MONITORING, ED: Glucose-Capillary: 109 mg/dL — ABNORMAL HIGH (ref 70–99)

## 2024-02-02 LAB — TROPONIN I (HIGH SENSITIVITY)
Troponin I (High Sensitivity): 7 ng/L (ref <18)
Troponin I (High Sensitivity): 9 ng/L (ref <18)

## 2024-02-02 MED ORDER — SODIUM CHLORIDE 0.9 % IV BOLUS
1000.0000 mL | Freq: Once | INTRAVENOUS | Status: AC
  Administered 2024-02-02: 1000 mL via INTRAVENOUS

## 2024-02-02 NOTE — ED Provider Notes (Signed)
 [place] EMERGENCY DEPARTMENT AT [facility] Provider Note   CSN: [id number] Arrival date & time: 02/02/24  1557     Patient presents with: Fatigue, Memory Loss, and Dizziness   [name] is a 89 y.o. male.  {Add pertinent medical, surgical, social history, OB history to YEP:[id number]} The history is provided by the patient and medical records.  Dizziness Associated symptoms: weakness (generalized)    89 year old male with history of hypertension, chronic kidney disease, GERD, herniated disc in his low back, hyperlipidemia, sleep apnea, history of prostate cancer, presenting to the ED from urgent care for further evaluation of several issues.  Patient reports he has been having fatigue, memory issues, and near syncopal events.  He adamantly denies feeling dizzy or any room spinning sensation.  States whenever he goes to change position like to stand up he feels like his equilibrium is very off and he is going to pass out.  He has had some minor syncopal episodes but denies any head trauma with this.  Did have a fall about a year ago which resulted in a mild hemorrhage but this resolved without any intervention.  He states overall he does feel generally weak.  Uses his walker to get around.  Does have some spinal stenosis so his legs get weak and go numb if he stands up and moves around.  This is unchanged from baseline.  He has not had any new bowel or bladder incontinence.  He states he is eating and drinking fine, no nausea or vomiting.  No fever, chills, or sick contacts.  He has noticed some episodes of his heart racing but denies any chest pain or shortness of breath.  Prior to Admission medications   Medication Sig Start Date End Date Taking? Authorizing Provider  acetaminophen  (TYLENOL ) 500 MG tablet Take 500 mg by mouth every 6 (six) hours as needed.    [provider]  albuterol  (VENTOLIN  HFA) 108 (90 Base) MCG/ACT inhaler Inhale 2 puffs into the lungs every 6  (six) hours as needed for shortness of breath. 01/11/19   [provider]  amLODipine  (NORVASC ) 10 MG tablet Take 5 mg by mouth daily. 03/18/19   [provider]  aspirin EC 81 MG tablet Take 81 mg by mouth daily.    [provider]  bisacodyl  (DULCOLAX) 5 MG EC tablet Take 2 tablets (10 mg total) by mouth daily as needed for up to 10 doses for moderate constipation. 01/07/22   Cottie Donnice PARAS, MD  budesonide-formoterol (SYMBICORT) 160-4.5 MCG/ACT inhaler Inhale 2 puffs into the lungs in the morning and at bedtime. 11/21/19   [provider]  diphenhydrAMINE (BENADRYL) 25 MG tablet Take 25 mg by mouth at bedtime.    [provider]  escitalopram (LEXAPRO) 20 MG tablet Take 20 mg by mouth at bedtime.    [provider]  esomeprazole (NEXIUM) 40 MG capsule Take 40 mg by mouth daily. 07/28/16   [provider]  Fenofibrate  150 MG CAPS Take 1 capsule by mouth daily.    [provider]  gabapentin  (NEURONTIN ) 300 MG capsule Take 300 mg by mouth 2 (two) times daily.    [provider]  levothyroxine  (SYNTHROID , LEVOTHROID) 100 MCG tablet Take 100 mcg by mouth daily before breakfast.    [provider]  lidocaine  (LIDODERM ) 5 % Place 1 patch onto the skin daily. Remove & Discard patch within 12 hours or as directed by MD 06/05/21   Henderly, Britni A, PA-C  Melatonin 10 MG TABS Take 10 mg by mouth at bedtime.    [provider]  mineral oil enema Place 133 mLs (1 enema total) rectally once as needed for up to 1 dose for severe constipation. 01/07/22   Cottie Donnice PARAS, MD  mirabegron ER (MYRBETRIQ) 50 MG TB24 tablet Take 50 mg by mouth at bedtime.    [provider]  oxyCODONE  (ROXICODONE ) 5 MG immediate release tablet Take 1 tablet (5 mg total) by mouth every 6 (six) hours as needed for severe pain. 06/05/21   Henderly, Britni A, PA-C  prednisoLONE acetate (PRED FORTE) 1 % ophthalmic suspension Place 1  drop into the right eye in the morning and at bedtime.     [provider]  primidone (MYSOLINE) 50 MG tablet Take 50-100 mg by mouth daily. Per pt one to two tab daily    [provider]  rosuvastatin (CRESTOR) 5 MG tablet Take 5 mg by mouth daily. 12/17/19   [provider]  timolol (TIMOPTIC) 0.5 % ophthalmic solution Place 1 drop into the right eye daily. 04/11/15   [provider]    Allergies: Hydrocodone     Review of Systems  Neurological:  Positive for syncope (near syncope), weakness (generalized) and light-headedness.  All other systems reviewed and are negative.   Updated Vital Signs BP (!) 167/90   Pulse 82   Temp 97.7 F (36.5 C)   Resp 18   SpO2 97%   Physical Exam Vitals and nursing note reviewed.  Constitutional:      Appearance: He is well-developed.  HENT:     Head: Normocephalic and atraumatic.     Mouth/Throat:     Comments: Normal tongue protrusion, handling secretions Eyes:     Conjunctiva/sclera: Conjunctivae normal.     Pupils: Pupils are equal, round, and reactive to light.     Comments: Significant cataracts and right  Cardiovascular:     Rate and Rhythm: Normal rate and regular rhythm.     Heart sounds: Normal heart sounds.  Pulmonary:     Effort: Pulmonary effort is normal.     Breath sounds: Normal breath sounds.  Abdominal:     General: Bowel sounds are normal.     Palpations: Abdomen is soft.  Musculoskeletal:        General: Normal range of motion.     Cervical back: Normal range of motion.  Skin:    General: Skin is warm and dry.  Neurological:     Mental Status: He is alert and oriented to person, place, and time.     Comments: AAOx3, answering questions and following commands appropriately; equal strength UE and LE bilaterally; CN grossly intact; moves all extremities appropriately without ataxia; no focal neuro deficits or facial asymmetry appreciated, no focal deficits     (all labs ordered are  listed, but only abnormal results are displayed) Labs Reviewed  COMPREHENSIVE METABOLIC PANEL WITH GFR - Abnormal; Notable for the following components:      Result Value   CO2 21 (*)    BUN 31 (*)    Creatinine, Ser 1.76 (*)    GFR, Estimated 37 (*)    All other components within normal limits  URINALYSIS, ROUTINE W REFLEX MICROSCOPIC - Abnormal; Notable for the following components:   Protein, ur 100 (*)    All other components within normal limits  CBG MONITORING, ED - Abnormal; Notable for the following components:   Glucose-Capillary 109 (*)    All other  components within normal limits  CBC  TSH  T4, FREE  TROPONIN I (HIGH SENSITIVITY)  TROPONIN I (HIGH SENSITIVITY)    EKG: None  Radiology: No results found.  {Document cardiac monitor, telemetry assessment procedure when appropriate:32947} Procedures   Medications Ordered in the ED  sodium chloride  0.9 % bolus 1,000 mL (has no administration in time range)      {Click here for ABCD2, HEART and other calculators REFRESH Note before signing:1}                              Medical Decision Making Amount and/or Complexity of Data Reviewed Labs: ordered. Radiology: ordered.   ***  {Document critical care time when appropriate  Document review of labs and clinical decision tools ie CHADS2VASC2, etc  Document your independent review of radiology images and any outside records  Document your discussion with family members, caretakers and with consultants  Document social determinants of health affecting pt's care  Document your decision making why or why not admission, treatments were needed:32947:::1}   Final diagnoses:  None    ED Discharge Orders     None

## 2024-02-02 NOTE — ED Notes (Signed)
 Pt endorses back pain, chronic

## 2024-02-02 NOTE — ED Notes (Signed)
 Patient transported to CT

## 2024-02-02 NOTE — ED Triage Notes (Signed)
 Pt send by UC for further eval of 1 week of dizziness, memory issues and generalized fatigue. Pt also reports HR fluctuations.

## 2024-02-03 ENCOUNTER — Observation Stay (HOSPITAL_COMMUNITY)

## 2024-02-03 ENCOUNTER — Encounter (HOSPITAL_COMMUNITY): Payer: Self-pay | Admitting: Internal Medicine

## 2024-02-03 DIAGNOSIS — R55 Syncope and collapse: Secondary | ICD-10-CM

## 2024-02-03 DIAGNOSIS — N179 Acute kidney failure, unspecified: Secondary | ICD-10-CM | POA: Diagnosis not present

## 2024-02-03 DIAGNOSIS — Z8546 Personal history of malignant neoplasm of prostate: Secondary | ICD-10-CM

## 2024-02-03 DIAGNOSIS — E860 Dehydration: Secondary | ICD-10-CM | POA: Diagnosis not present

## 2024-02-03 DIAGNOSIS — N400 Enlarged prostate without lower urinary tract symptoms: Secondary | ICD-10-CM | POA: Insufficient documentation

## 2024-02-03 LAB — BASIC METABOLIC PANEL WITH GFR
Anion gap: 14 (ref 5–15)
BUN: 22 mg/dL (ref 8–23)
CO2: 22 mmol/L (ref 22–32)
Calcium: 8.9 mg/dL (ref 8.9–10.3)
Chloride: 104 mmol/L (ref 98–111)
Creatinine, Ser: 1.22 mg/dL (ref 0.61–1.24)
GFR, Estimated: 57 mL/min — ABNORMAL LOW (ref >60)
Glucose, Bld: 73 mg/dL (ref 70–99)
Potassium: 3.3 mmol/L — ABNORMAL LOW (ref 3.5–5.1)
Sodium: 140 mmol/L (ref 135–145)

## 2024-02-03 LAB — ECHOCARDIOGRAM COMPLETE
Area-P 1/2: 2.87 cm2
Height: 67 in
S' Lateral: 3.1 cm
Weight: 2624 [oz_av]

## 2024-02-03 LAB — TSH: TSH: 3.158 u[IU]/mL (ref 0.350–4.500)

## 2024-02-03 LAB — CBC
HCT: 39.2 % (ref 39.0–52.0)
Hemoglobin: 13.3 g/dL (ref 13.0–17.0)
MCH: 30.5 pg (ref 26.0–34.0)
MCHC: 33.9 g/dL (ref 30.0–36.0)
MCV: 89.9 fL (ref 80.0–100.0)
Platelets: 103 K/uL — ABNORMAL LOW (ref 150–400)
RBC: 4.36 MIL/uL (ref 4.22–5.81)
RDW: 14.3 % (ref 11.5–15.5)
WBC: 6.5 K/uL (ref 4.0–10.5)
nRBC: 0 % (ref 0.0–0.2)

## 2024-02-03 LAB — T4, FREE: Free T4: 0.69 ng/dL (ref 0.61–1.12)

## 2024-02-03 MED ORDER — DIPHENHYDRAMINE HCL 25 MG PO CAPS
25.0000 mg | ORAL_CAPSULE | Freq: Once | ORAL | Status: DC
  Filled 2024-02-03: qty 1

## 2024-02-03 MED ORDER — ROSUVASTATIN CALCIUM 5 MG PO TABS
5.0000 mg | ORAL_TABLET | Freq: Every day | ORAL | Status: DC
Start: 2024-02-03 — End: 2024-02-03
  Administered 2024-02-03: 5 mg via ORAL
  Filled 2024-02-03: qty 1

## 2024-02-03 MED ORDER — ESCITALOPRAM OXALATE 10 MG PO TABS: 20.0000 mg | ORAL_TABLET | Freq: Every day | ORAL | Status: DC

## 2024-02-03 MED ORDER — PANTOPRAZOLE SODIUM 40 MG PO TBEC
40.0000 mg | DELAYED_RELEASE_TABLET | Freq: Every day | ORAL | Status: DC
  Administered 2024-02-03: 40 mg via ORAL
  Filled 2024-02-03: qty 1

## 2024-02-03 MED ORDER — ENOXAPARIN SODIUM 30 MG/0.3ML IJ SOSY
30.0000 mg | PREFILLED_SYRINGE | INTRAMUSCULAR | Status: DC
  Administered 2024-02-03: 30 mg via SUBCUTANEOUS
  Filled 2024-02-03: qty 0.3

## 2024-02-03 MED ORDER — LEVOTHYROXINE SODIUM 100 MCG PO TABS
100.0000 ug | ORAL_TABLET | Freq: Every day | ORAL | Status: DC
Start: 2024-02-03 — End: 2024-02-03
  Administered 2024-02-03: 100 ug via ORAL
  Filled 2024-02-03: qty 1

## 2024-02-03 MED ORDER — MELATONIN 5 MG PO TABS
10.0000 mg | ORAL_TABLET | Freq: Every day | ORAL | Status: DC
  Filled 2024-02-03: qty 2

## 2024-02-03 MED ORDER — ACETAMINOPHEN 325 MG PO TABS
650.0000 mg | ORAL_TABLET | Freq: Four times a day (QID) | ORAL | Status: DC | PRN
  Administered 2024-02-03: 650 mg via ORAL
  Filled 2024-02-03: qty 2

## 2024-02-03 MED ORDER — ACETAMINOPHEN 650 MG RE SUPP: 650.0000 mg | Freq: Four times a day (QID) | RECTAL | Status: DC | PRN

## 2024-02-03 MED ORDER — AMLODIPINE BESYLATE 5 MG PO TABS
5.0000 mg | ORAL_TABLET | Freq: Every day | ORAL | Status: DC
  Administered 2024-02-03: 5 mg via ORAL
  Filled 2024-02-03: qty 1

## 2024-02-03 NOTE — Discharge Summary (Signed)
 Physician Discharge Summary   Patient: [name] MRN: [id number] DOB: 1933/02/01  Admit date:     02/02/2024  Discharge date: 02/03/24  Discharge Physician: Lonni SHAUNNA Dalton   PCP: Delilah Murray HERO., MD     Recommendations at discharge:  Follow up with PCP Dr. Delilah Dr. Delilah: Please check BMP in 1 week (discharge Cr 1.2) Please discuss polypharmacy with patient and granddaughter, and come up with taper/cessation plans for appropriate medications     Discharge Diagnoses: Principal Problem:   Dehydration Active Problems:   Near syncope   Polypharmacy   Hypothyroidism   Hyperlipidemia   OBSTRUCTIVE SLEEP APNEA   GERD   Benign essential HTN   Obstructive sleep apnea treated with continuous positive airway pressure (CPAP)   BPH (benign prostatic hyperplasia)   History of prostate cancer     Hospital Course: 89 y.o. M with HTN, hypothyroidism, CKD IIIa baseline 1.2, hx prostate CA, dementia and BPH who presented with 2 weeks of dizziness with standing.  Recently started taking baclofen PRN.  Now in the last 1-2 weeks, has had dizziness with standing repeatedly, also sometimes while walking gets a sudden imbalance.  No vertigo, no focal speech or strength deficits.  No recent URI symptoms, UTI symptoms, fever.      Dizziness Given fluids and returned to baseline.   Echo normal.  CTH normal.  No focal neurological deficits, ECG normal, no further work up recommended.    Dizziness likely a combination of #2 and #3.  Given fluids here, did well.  Orthostatics checked and essentially normal.  Ate well, stable for discharge.   Polypharmacy Patient is 89 y.o and takes 9 psychotropic medications.  He asks spontaneously about reducing the medications he takes.  Given the temporal association between baclofen and his new symptoms, I recommend this be stopped right now.  I encourage him to discuss with his primary care physician the importance of the following 8  psychotropic medications (admittedly only the first is on the [facility] list): Benadryl Trazodone Lexapro Gabapentin  Galantamine Memantine Mirabegron Primidone   Elevated creatinine Cr up transiently, resolved overnight with fluids.  AKI ruled out, transient dehydration.           The [place]  Controlled Substances Registry was reviewed for this patient prior to discharge.  Consultants: None Procedures performed: Echo  Disposition: Home Diet recommendation:  Regular diet  DISCHARGE MEDICATION: Allergies as of 02/03/2024       Reactions   Donepezil Other (See Comments)   Dizziness    Oxycodone  Itching   Primidone Other (See Comments)   Too sleepy   Tramadol Itching   Hydrocodone  Itching        Medication List     STOP taking these medications    baclofen 10 MG tablet Commonly known as: LIORESAL       TAKE these medications    acetaminophen  500 MG tablet Commonly known as: TYLENOL  Take 500 mg by mouth every 6 (six) hours as needed.   albuterol  108 (90 Base) MCG/ACT inhaler Commonly known as: VENTOLIN  HFA Inhale 2 puffs into the lungs every 6 (six) hours as needed for shortness of breath.   amLODipine  10 MG tablet Commonly known as: NORVASC  Take 5 mg by mouth daily.   aspirin EC 81 MG tablet Take 81 mg by mouth daily.   bisacodyl  5 MG EC tablet Commonly known as: DULCOLAX Take 2 tablets (10 mg total) by mouth daily as needed for up to 10 doses for  moderate constipation.   Breo Ellipta 100-25 MCG/ACT Aepb Generic drug: fluticasone furoate-vilanterol Inhale 1 puff into the lungs daily.   budesonide-formoterol 160-4.5 MCG/ACT inhaler Commonly known as: SYMBICORT Inhale 2 puffs into the lungs in the morning and at bedtime.   diphenhydrAMINE 25 MG tablet Commonly known as: BENADRYL Take 25 mg by mouth at bedtime.   escitalopram 20 MG tablet Commonly known as: LEXAPRO Take 20 mg by mouth at bedtime.   esomeprazole 40 MG  capsule Commonly known as: NEXIUM Take 40 mg by mouth daily.   Fenofibrate  150 MG Caps Take 1 capsule by mouth daily.   finasteride 5 MG tablet Commonly known as: PROSCAR Take 5 mg by mouth daily.   gabapentin  300 MG capsule Commonly known as: NEURONTIN  Take 300 mg by mouth 2 (two) times daily.   galantamine 16 MG 24 hr capsule Commonly known as: RAZADYNE ER Take 16 mg by mouth every morning.   levothyroxine  100 MCG tablet Commonly known as: SYNTHROID  Take 100 mcg by mouth daily before breakfast.   lidocaine  5 % Commonly known as: Lidoderm  Place 1 patch onto the skin daily. Remove & Discard patch within 12 hours or as directed by MD   Melatonin 10 MG Tabs Take 10 mg by mouth at bedtime.   memantine 10 MG tablet Commonly known as: NAMENDA Take 10 mg by mouth 2 (two) times daily.   mineral oil enema Place 133 mLs (1 enema total) rectally once as needed for up to 1 dose for severe constipation.   mirabegron ER 50 MG Tb24 tablet Commonly known as: MYRBETRIQ Take 50 mg by mouth at bedtime.   oxyCODONE  5 MG immediate release tablet Commonly known as: Roxicodone  Take 1 tablet (5 mg total) by mouth every 6 (six) hours as needed for severe pain.   prednisoLONE acetate 1 % ophthalmic suspension Commonly known as: PRED FORTE Place 1 drop into the right eye in the morning and at bedtime.   primidone 50 MG tablet Commonly known as: MYSOLINE Take 50-100 mg by mouth daily. Per pt one to two tab daily   rosuvastatin 5 MG tablet Commonly known as: CRESTOR Take 5 mg by mouth daily.   sertraline 100 MG tablet Commonly known as: ZOLOFT Take 100 mg by mouth daily.   tamsulosin  0.4 MG Caps capsule Commonly known as: FLOMAX  Take 0.4 mg by mouth daily.   timolol 0.5 % ophthalmic solution Commonly known as: TIMOPTIC Place 1 drop into the right eye daily.   traZODone 100 MG tablet Commonly known as: DESYREL Take 200 mg by mouth.   triamcinolone cream 0.1 % Commonly  known as: KENALOG Apply topically 2 (two) times daily.        Contact information for follow-up providers     Delilah Murray HERO., MD. Schedule an appointment as soon as possible for a visit in 1 week(s).   Specialty: Internal Medicine Contact information: [address] [place] [address] [phone]              Contact information for after-discharge care     Home Medical Care     [facility]) .   Service: Home Health Services Contact information: [address] [place]  [phone]                     Discharge Instructions     Discharge instructions   Complete by: As directed    **  IMPORTANT DISCHARGE INSTRUCTIONS**   From Dr. Jonel: You were admitted for dizziness  Here, you were noted to have mild dehydration  Also, as we discussed, your medicines could be causing you to be dizzy  STOP baclofen  Drink at least 2 quarts of water  per day, or close to it  Go see your primary doctor in 1 week  Ask your primary doctor if you can stop any of the following medicines, all of which could be associated with dizziness: Diphenhydramine (Benadryl) used for sleep Escitalopram (Lexapro) used for depression or anxiety Gabapentin  (Neurotin) used for nerve pain/tingling in the feet Galantamine (razadyne ER) used for memory Memantine (Namenda) used for memory Primidone (Mysoline) used for tremor Trazodone (desyrel) used for sleep   Increase activity slowly   Complete by: As directed        Discharge Exam: Filed Weights   02/03/24 0255  Weight: 74.4 kg    General: Pt is alert, awake, not in acute distress Cardiovascular: RRR, nl S1-S2, no murmurs appreciated.   No LE edema.   Respiratory: Normal respiratory rate and rhythm.  CTAB without rales or wheezes. Abdominal: Abdomen soft and non-tender.  No distension or HSM.   Neuro/Psych: Strength symmetric in  upper and lower extremities.  Judgment and insight appear normal.  All history collected thorugh daughter, who asked to interpret (and is a former interpreter)   Condition at discharge: good  The results of significant diagnostics from this hospitalization (including imaging, microbiology, ancillary and laboratory) are listed below for reference.   Imaging Studies: ECHOCARDIOGRAM COMPLETE Result Date: 02/03/2024    ECHOCARDIOGRAM REPORT   Patient Name:   [name] Date of Exam: 02/03/2024 Medical Rec #:  [id number]       Height:       67.0 in Accession #:    [id number]      Weight:       164.0 lb Date of Birth:  1932/07/09       BSA:          1.859 m Patient Age:    89 years        BP:           163/80 mmHg Patient Gender: M               HR:           77 bpm. Exam Location:  Inpatient Procedure: 2D Echo, Cardiac Doppler and Color Doppler (Both Spectral and Color            Flow Doppler were utilized during procedure). Indications:    Syncope  History:        Patient has no prior history of Echocardiogram examinations.                 Signs/Symptoms:Syncope, Dizziness/Lightheadedness and Fatigue;                 Risk Factors:Dyslipidemia and Hypertension.  Sonographer:    Juliene Rucks Referring Phys: [phone] ARSHAD N KAKRAKANDY IMPRESSIONS  1. Left ventricular ejection fraction, by estimation, is 60 to 65%. The left ventricle has normal function. The left ventricle has no regional wall motion abnormalities. There is mild concentric left ventricular hypertrophy. Left ventricular diastolic parameters are consistent with Grade I diastolic dysfunction (impaired relaxation).  2. Right ventricular systolic function is normal. The right ventricular size is normal.  3. A small pericardial effusion is present.  4. The mitral valve is normal in structure. Trivial mitral valve regurgitation.  No evidence of mitral stenosis.  5. The aortic valve was not well visualized. Aortic valve regurgitation is not visualized. No  aortic stenosis is present.  6. Aortic dilatation noted. There is mild dilatation of the aortic root, measuring 41 mm.  7. The inferior vena cava is normal in size with <50% respiratory variability, suggesting right atrial pressure of 8 mmHg. FINDINGS  Left Ventricle: Left ventricular ejection fraction, by estimation, is 60 to 65%. The left ventricle has normal function. The left ventricle has no regional wall motion abnormalities. The left ventricular internal cavity size was normal in size. There is  mild concentric left ventricular hypertrophy. Left ventricular diastolic parameters are consistent with Grade I diastolic dysfunction (impaired relaxation). Indeterminate filling pressures. Right Ventricle: The right ventricular size is normal. No increase in right ventricular wall thickness. Right ventricular systolic function is normal. Left Atrium: Left atrial size was normal in size. Right Atrium: Right atrial size was normal in size. Pericardium: A small pericardial effusion is present. Mitral Valve: The mitral valve is normal in structure. Trivial mitral valve regurgitation. No evidence of mitral valve stenosis. Tricuspid Valve: The tricuspid valve is normal in structure. Tricuspid valve regurgitation is not demonstrated. No evidence of tricuspid stenosis. Aortic Valve: The aortic valve was not well visualized. Aortic valve regurgitation is not visualized. No aortic stenosis is present. Pulmonic Valve: The pulmonic valve was normal in structure. Pulmonic valve regurgitation is not visualized. No evidence of pulmonic stenosis. Aorta: Aortic dilatation noted. There is mild dilatation of the aortic root, measuring 41 mm. Venous: The inferior vena cava is normal in size with less than 50% respiratory variability, suggesting right atrial pressure of 8 mmHg. IAS/Shunts: No atrial level shunt detected by color flow Doppler.  LEFT VENTRICLE PLAX 2D LVIDd:         4.40 cm   Diastology LVIDs:         3.10 cm   LV e'  medial:    6.09 cm/s LV PW:         1.35 cm   LV E/e' medial:  10.2 LV IVS:        1.23 cm   LV e' lateral:   6.09 cm/s LVOT diam:     2.20 cm   LV E/e' lateral: 10.2 LV SV:         73 LV SV Index:   39 LVOT Area:     3.80 cm  RIGHT VENTRICLE            IVC RV S prime:     8.70 cm/s  IVC diam: 1.60 cm TAPSE (M-mode): 2.0 cm LEFT ATRIUM             Index LA diam:        3.10 cm 1.67 cm/m LA Vol (A2C):   41.0 ml 22.06 ml/m LA Vol (A4C):   51.2 ml 27.55 ml/m LA Biplane Vol: 49.9 ml 26.85 ml/m  AORTIC VALVE LVOT Vmax:   92.20 cm/s LVOT Vmean:  63.400 cm/s LVOT VTI:    0.191 m  AORTA Ao Root diam: 4.10 cm Ao Asc diam:  3.70 cm MITRAL VALVE MV Area (PHT): 2.87 cm     SHUNTS MV Decel Time: 264 msec     Systemic VTI:  0.19 m MV E velocity: 62.40 cm/s   Systemic Diam: 2.20 cm MV A velocity: 102.00 cm/s MV E/A ratio:  0.61 Annabella Scarce MD Electronically signed by Annabella Scarce MD Signature Date/Time: 02/03/2024/9:15:10 AM  Final    CT Head Wo Contrast Result Date: 02/02/2024 EXAM: CT HEAD WITHOUT CONTRAST 02/02/2024 10:58:00 PM TECHNIQUE: CT of the head was performed without the administration of intravenous contrast. Automated exposure control, iterative reconstruction, and/or weight based adjustment of the mA/kV was utilized to reduce the radiation dose to as low as reasonably achievable. COMPARISON: 03/06/2020 CLINICAL HISTORY: Syncope/presyncope, cerebrovascular cause suspected. FINDINGS: BRAIN AND VENTRICLES: No acute hemorrhage. No evidence of acute infarct. No hydrocephalus. No extra-axial collection. No mass effect or midline shift. There is atrophy and chronic small vessel disease throughout the deep white matter. ORBITS: No acute abnormality. SINUSES: No acute abnormality. SOFT TISSUES AND SKULL: No acute soft tissue abnormality. No skull fracture. IMPRESSION: 1. No acute intracranial abnormality. 2. Chronic small vessel ischemic changes and cerebral atrophy. Electronically signed by: Franky Crease MD  02/02/2024 11:02 PM EST RP Workstation: [id number]    Microbiology: Results for orders placed or performed during the hospital encounter of 05/22/17  Gastrointestinal Panel by PCR , Stool     Status: Abnormal   Collection Time: 05/23/17 12:18 AM   Specimen: Stool  Result Value Ref Range Status   Campylobacter species NOT DETECTED NOT DETECTED Final   Plesimonas shigelloides NOT DETECTED NOT DETECTED Final   Salmonella species NOT DETECTED NOT DETECTED Final   Yersinia enterocolitica NOT DETECTED NOT DETECTED Final   Vibrio species NOT DETECTED NOT DETECTED Final   Vibrio cholerae NOT DETECTED NOT DETECTED Final   Enteroaggregative E coli (EAEC) NOT DETECTED NOT DETECTED Final   Enteropathogenic E coli (EPEC) NOT DETECTED NOT DETECTED Final   Enterotoxigenic E coli (ETEC) NOT DETECTED NOT DETECTED Final   Shiga like toxin producing E coli (STEC) NOT DETECTED NOT DETECTED Final   Shigella/Enteroinvasive E coli (EIEC) NOT DETECTED NOT DETECTED Final   Cryptosporidium NOT DETECTED NOT DETECTED Final   Cyclospora cayetanensis NOT DETECTED NOT DETECTED Final   Entamoeba histolytica NOT DETECTED NOT DETECTED Final   Giardia lamblia NOT DETECTED NOT DETECTED Final   Adenovirus F40/41 NOT DETECTED NOT DETECTED Final   Astrovirus NOT DETECTED NOT DETECTED Final   Norovirus GI/GII DETECTED (A) NOT DETECTED Final    Comment: RESULT CALLED TO, READ BACK BY AND VERIFIED WITH: SUSAN CLAPP 05/23/17 @ 1449  MLK    Rotavirus A NOT DETECTED NOT DETECTED Final   Sapovirus (I, II, IV, and V) NOT DETECTED NOT DETECTED Final    Comment: Performed at [facility], [address] [place], [place] [address]  C difficile quick scan w PCR reflex     Status: None   Collection Time: 05/23/17 12:18 AM   Specimen: Stool  Result Value Ref Range Status   C Diff antigen NEGATIVE NEGATIVE Final   C Diff toxin NEGATIVE NEGATIVE Final   C Diff interpretation No C. difficile detected.  Final    Comment:  Performed at [facility], [address], [place], [place] [address]    Labs: CBC: Recent Labs  Lab 02/02/24 1640 02/03/24 0336  WBC 7.2 6.5  HGB 14.6 13.3  HCT 42.4 39.2  MCV 91.2 89.9  PLT 150 103*   Basic Metabolic Panel: Recent Labs  Lab 02/02/24 1640 02/03/24 0336  NA 140 140  K 4.5 3.3*  CL 108 104  CO2 21* 22  GLUCOSE 92 73  BUN 31* 22  CREATININE 1.76* 1.22  CALCIUM 9.1 8.9   Liver Function Tests: Recent Labs  Lab 02/02/24 1640  AST 24  ALT 13  ALKPHOS 82  BILITOT 0.8  PROT 6.9  ALBUMIN 4.0   CBG: Recent Labs  Lab 02/02/24 1853  GLUCAP 109*    Discharge time spent: approximately 45 minutes spent on discharge counseling, evaluation of patient on day of discharge, and coordination of discharge planning with nursing, social work, pharmacy and case management  Signed: Lonni SHAUNNA Dalton, MD [facility] 02/03/2024

## 2024-02-03 NOTE — Care Management Obs Status (Signed)
 MEDICARE OBSERVATION STATUS NOTIFICATION   Patient Details  Name: [name] MRN: [id number] Date of Birth: Mar 24, 1933   Medicare Observation Status Notification Given:  Yes Obs notice signed and copy given.   Cloud Graham 02/03/2024, 12:41 PM

## 2024-02-03 NOTE — ED Notes (Signed)
 Hospitalist at bedside

## 2024-02-03 NOTE — TOC Transition Note (Signed)
 Transition of Care [facility]) - Discharge Note   Patient Details  Name: [name] MRN: [id number] Date of Birth: 05-04-1932  Transition of Care [facility]) CM/SW Contact:  Landry DELENA Senters, RN Phone Number: 02/03/2024, 1:40 PM   Clinical Narrative:     Patient discharging home with [facility] services through [facility]. Put information on the AVS. [facility] will contact patient for first home visit. Daughter at bedside and will provide transportation home.   DME currently at home--rollator and shower seat. Patient lives with wife and has 24hr supervision.   Final next level of care: Home w Home Health Services Barriers to Discharge: No Barriers Identified   Patient Goals and CMS Choice   CMS Medicare.gov Compare Post Acute Care list provided to:: Patient Choice offered to / list presented to : Patient, Adult Children      Discharge Placement                       Discharge Plan and Services Additional resources added to the After Visit Summary for                            [facility] Arranged: PT, OT HH Agency: [facility] Date [facility] Agency Contacted: 02/03/24 Time HH Agency Contacted: 1339 Representative spoke with at [facility] Agency: [place]  Social Drivers of Health (SDOH) Interventions SDOH Screenings   Food Insecurity: Food Insecurity Present (02/03/2024)  Housing: Low Risk  (02/03/2024)  Transportation Needs: No Transportation Needs (02/03/2024)  Utilities: Not At Risk (02/03/2024)  Social Connections: Moderately Isolated (02/03/2024)  Tobacco Use: Low Risk  (02/03/2024)     Readmission Risk Interventions     No data to display

## 2024-02-03 NOTE — H&P (Signed)
 History and Physical    [name] FMW:[id number] DOB: 30-Jun-1932 DOA: 02/02/2024  Patient coming from: Home.  Chief Complaint: Dizziness.  HPI: [name] is a 89 y.o. male with history of hypertension, hypothyroidism, chronic kidney disease stage III, prostate cancer, BPH, sleep apnea presents to the ER with complaints of dizziness.  Patient states he has been feeling dizzy whenever he stands up and when he tries to walk.  Patient ambulates with the help of a walker over the last 2 years.  Patient's symptoms have been ongoing for last 2 weeks.  Denies chest pain, shortness of breath nausea vomiting diarrhea but at times had some palpitations.  No recent change in medications as per the patient.  Patient's son provided translation.  ED Course: In the ER patient was hemodynamically stable.  CT head was unremarkable.  EKG shows normal sinus rhythm creatinine was 1.7 and slightly elevated from recent baseline troponins were negative.  While in the ER when patient was trying to go to the bathroom with the help of the walker and when he stood up from the bed he felt again dizzy.  Because creatinine was slightly elevated patient was given 1 L fluid bolus in the ER.  Patient admitted for further management of dizziness likely from orthostatic changes.  Review of Systems: As per HPI, rest all negative.   Past Medical History:  Diagnosis Date   Allergic rhinitis, seasonal    Anxiety    Benign essential tremor    neurologist-- dr scot   Benign paroxysmal positional vertigo due to bilateral vestibular disorder    followed by neurologist   Bladder neck contracture    CKD (chronic kidney disease), stage III (HCC)    followed by pcp   DDD (degenerative disc disease), cervical    Depression    Disc degeneration, lumbar    Full dentures    GERD (gastroesophageal reflux disease)    Glaucoma, steroid induced    both   Hemorrhoids    Hiatal hernia    History of tuberculosis 1942   per  pt treated for TB age 34 (lived in [place]);  pt previously followed by pulmonology w/ [facility] 2018   Hyperlipidemia    Hypothyroidism    followed by pcp---  previously seen endocrinology-- dr m. gorris   Insomnia    Lower urinary tract symptoms (LUTS)    Mild persistent asthma    followed by pcp   Multiple lung nodules    followed by pcp--  last chest CT in care everywhere 08-26-2016 stable   Multiple thyroid nodules    hx bx's 11/ 2019 and 10/ 2021 results in epic and care everywhere, benign follicular   OSA on CPAP    followed by dr buck--- severe osa per study in epic 02-28-2011,  pt stated uses nightly   Osteoarthritis    Peripheral vascular disease    followed by vascular-- Leita Dub PA ([facility])  per lov note 01-23-2020 stable [facility] native artery bilateral legs with intermittant claudication   Prostate cancer (HCC) 01/2017   dx 11/ 2018 active surveillance   Right leg weakness    per pt due to pinched nerve in back   Wears glasses    Wears hearing aid in both ears     Past Surgical History:  Procedure Laterality Date   CATARACT EXTRACTION W/ INTRAOCULAR LENS IMPLANT Bilateral    right 2004;  left 2012   CHOLECYSTECTOMY OPEN  1980s approx.   CORNEAL TRANSPLANT Right 05/17/2013   and x2  in 06/ 2015 due ot complications @ [facility]   CYSTOSCOPY N/A 11/06/2020   Procedure: CYSTOSCOPY;  Surgeon: Rosalind Zachary NOVAK, MD;  Location: [facility];  Service: Urology;  Laterality: N/A;   FINGER ARTHRODESIS Right 06/22/2017   right middle   LUMBAR FUSION     1990s approx. ;   L4-L5   TRANSURETHRAL RESECTION OF BLADDER NECK N/A 11/06/2020   Procedure: TRANSURETHRAL ABLATION OF BLADDER NECK CONTRACTURE;  Surgeon: Rosalind Zachary NOVAK, MD;  Location: [facility];  Service: Urology;  Laterality: N/A;   TRANSURETHRAL RESECTION OF PROSTATE  02/12/2017   @[facility]      reports that he has never smoked. He has never used smokeless  tobacco. He reports that he does not drink alcohol and does not use drugs.  Allergies  Allergen Reactions   Hydrocodone  Itching    Family History  Problem Relation Age of Onset   Cancer Brother    Brain cancer Sister     Prior to Admission medications   Medication Sig Start Date End Date Taking? Authorizing Provider  acetaminophen  (TYLENOL ) 500 MG tablet Take 500 mg by mouth every 6 (six) hours as needed.    [provider]  albuterol  (VENTOLIN  HFA) 108 (90 Base) MCG/ACT inhaler Inhale 2 puffs into the lungs every 6 (six) hours as needed for shortness of breath. 01/11/19   [provider]  amLODipine  (NORVASC ) 10 MG tablet Take 5 mg by mouth daily. 03/18/19   [provider]  aspirin EC 81 MG tablet Take 81 mg by mouth daily.    [provider]  bisacodyl  (DULCOLAX) 5 MG EC tablet Take 2 tablets (10 mg total) by mouth daily as needed for up to 10 doses for moderate constipation. 01/07/22   Cottie Donnice PARAS, MD  budesonide-formoterol (SYMBICORT) 160-4.5 MCG/ACT inhaler Inhale 2 puffs into the lungs in the morning and at bedtime. 11/21/19   [provider]  diphenhydrAMINE (BENADRYL) 25 MG tablet Take 25 mg by mouth at bedtime.    [provider]  escitalopram (LEXAPRO) 20 MG tablet Take 20 mg by mouth at bedtime.    [provider]  esomeprazole (NEXIUM) 40 MG capsule Take 40 mg by mouth daily. 07/28/16   [provider]  Fenofibrate  150 MG CAPS Take 1 capsule by mouth daily.    [provider]  gabapentin  (NEURONTIN ) 300 MG capsule Take 300 mg by mouth 2 (two) times daily.    [provider]  levothyroxine  (SYNTHROID , LEVOTHROID) 100 MCG tablet Take 100 mcg by mouth daily before breakfast.    [provider]  lidocaine  (LIDODERM ) 5 % Place 1 patch onto the skin daily. Remove & Discard patch within 12 hours or as directed by MD 06/05/21   Henderly, Britni A, PA-C  Melatonin 10 MG TABS Take 10 mg  by mouth at bedtime.    [provider]  mineral oil enema Place 133 mLs (1 enema total) rectally once as needed for up to 1 dose for severe constipation. 01/07/22   Cottie Donnice PARAS, MD  mirabegron ER (MYRBETRIQ) 50 MG TB24 tablet Take 50 mg by mouth at bedtime.    [provider]  oxyCODONE  (ROXICODONE ) 5 MG immediate release tablet Take 1 tablet (5 mg total) by mouth every 6 (six) hours as needed for severe pain. 06/05/21   Henderly, Britni A, PA-C  prednisoLONE acetate (PRED FORTE) 1 % ophthalmic  suspension Place 1 drop into the right eye in the morning and at bedtime.     [provider]  primidone (MYSOLINE) 50 MG tablet Take 50-100 mg by mouth daily. Per pt one to two tab daily    [provider]  rosuvastatin (CRESTOR) 5 MG tablet Take 5 mg by mouth daily. 12/17/19   [provider]  timolol (TIMOPTIC) 0.5 % ophthalmic solution Place 1 drop into the right eye daily. 04/11/15   [provider]    Physical Exam: Constitutional: Moderately built and nourished. Vitals:   02/03/24 0145 02/03/24 0149 02/03/24 0200 02/03/24 0204  BP:   (!) 148/87   Pulse: 86 84 78   Resp: 17 15 11    Temp:    98.8 F (37.1 C)  TempSrc:      SpO2: 100% 99% 99%    Eyes: Anicteric no pallor. ENMT: No discharge from the ears eyes nose or mouth. Neck: No mass felt.  No neck rigidity. Respiratory: No rhonchi or crepitations. Cardiovascular: S1-S2 heard. Abdomen: Soft nontender bowel sound present. Musculoskeletal: No edema. Skin: No rash. Neurologic: Alert awake oriented to time place and person.  Moves all extremities. Psychiatric: Appears normal.  Normal affect.   Labs on Admission: I have personally reviewed following labs and imaging studies  CBC: Recent Labs  Lab 02/02/24 1640  WBC 7.2  HGB 14.6  HCT 42.4  MCV 91.2  PLT 150   Basic Metabolic Panel: Recent Labs  Lab 02/02/24 1640  NA 140  K 4.5  CL 108  CO2 21*  GLUCOSE 92  BUN  31*  CREATININE 1.76*  CALCIUM 9.1   GFR: CrCl cannot be calculated (Unknown ideal weight.). Liver Function Tests: Recent Labs  Lab 02/02/24 1640  AST 24  ALT 13  ALKPHOS 82  BILITOT 0.8  PROT 6.9  ALBUMIN 4.0   No results for input(s): LIPASE, AMYLASE in the last 168 hours. No results for input(s): AMMONIA in the last 168 hours. Coagulation Profile: No results for input(s): INR, PROTIME in the last 168 hours. Cardiac Enzymes: No results for input(s): CKTOTAL, CKMB, CKMBINDEX, TROPONINI in the last 168 hours. BNP (last 3 results) No results for input(s): PROBNP in the last 8760 hours. HbA1C: No results for input(s): HGBA1C in the last 72 hours. CBG: Recent Labs  Lab 02/02/24 1853  GLUCAP 109*   Lipid Profile: No results for input(s): CHOL, HDL, LDLCALC, TRIG, CHOLHDL, LDLDIRECT in the last 72 hours. Thyroid Function Tests: Recent Labs    02/03/24 0126  TSH 3.158  FREET4 0.69   Anemia Panel: No results for input(s): VITAMINB12, FOLATE, FERRITIN, TIBC, IRON, RETICCTPCT in the last 72 hours. Urine analysis:    Component Value Date/Time   COLORURINE YELLOW 02/02/2024 1704   APPEARANCEUR CLEAR 02/02/2024 1704   LABSPEC 1.016 02/02/2024 1704   PHURINE 5.0 02/02/2024 1704   GLUCOSEU NEGATIVE 02/02/2024 1704   GLUCOSEU negative 04/02/2007 0000   HGBUR NEGATIVE 02/02/2024 1704   HGBUR negative 01/26/2008 0856   BILIRUBINUR NEGATIVE 02/02/2024 1704   KETONESUR NEGATIVE 02/02/2024 1704   PROTEINUR 100 (A) 02/02/2024 1704   UROBILINOGEN 0.2 03/06/2017 1154   NITRITE NEGATIVE 02/02/2024 1704   LEUKOCYTESUR NEGATIVE 02/02/2024 1704   Sepsis Labs: @LABRCNTIP (procalcitonin:4,lacticidven:4) )No results found for this or any previous visit (from the past 240 hours).   Radiological Exams on Admission: CT Head Wo Contrast Result Date: 02/02/2024 EXAM: CT HEAD WITHOUT CONTRAST 02/02/2024 10:58:00 PM TECHNIQUE: CT of the head  was performed  without the administration of intravenous contrast. Automated exposure control, iterative reconstruction, and/or weight based adjustment of the mA/kV was utilized to reduce the radiation dose to as low as reasonably achievable. COMPARISON: 03/06/2020 CLINICAL HISTORY: Syncope/presyncope, cerebrovascular cause suspected. FINDINGS: BRAIN AND VENTRICLES: No acute hemorrhage. No evidence of acute infarct. No hydrocephalus. No extra-axial collection. No mass effect or midline shift. There is atrophy and chronic small vessel disease throughout the deep white matter. ORBITS: No acute abnormality. SINUSES: No acute abnormality. SOFT TISSUES AND SKULL: No acute soft tissue abnormality. No skull fracture. IMPRESSION: 1. No acute intracranial abnormality. 2. Chronic small vessel ischemic changes and cerebral atrophy. Electronically signed by: Franky Crease MD 02/02/2024 11:02 PM EST RP Workstation: [id number]    EKG: Independently reviewed.  Normal sinus rhythm.  Assessment/Plan Principal Problem:   Near syncope Active Problems:   Hypothyroidism   Hyperlipidemia   OBSTRUCTIVE SLEEP APNEA   GERD   Benign essential HTN   ARF (acute renal failure)    Near syncopal episodes -     patient's symptoms usually happen only when he is trying to get up from a seated position likely orthostatic.  Will check orthostatic changes.  Closely monitor telemetry.  Check 2D echo.  Get physical therapy consult. Hypertension on amlodipine .  Check orthostatics. Hypothyroidism on Synthroid . Cognitive impairment as per the recent notes from neurology patient was on Razadyne and Namenda.  Family is bringing the medications to verify the dose. Acute on chronic disease stage III creatinine mildly elevated from recent past.  Received 1 L fluid bolus in the ER.  Follow metabolic panel. BPH on finasteride and Flomax  per the urology notes recently.  Family bring in the medication and to verify the dose. History of prostate  cancer incidentally found on TURP. Sleep apnea on CPAP at bedtime. GERD on PPI. Hyperlipidemia on statins. History of chronic headaches takes baclofen as per the primary care notes.  Patient's daughter is bringing home medication in the morning.  Patient was able to confirm medication doses of amlodipine  Synthroid  and Nexium.   DVT prophylaxis: Lovenox. Code Status: Full code. Family Communication: Patient's son. Disposition Plan: Monitored bed. Consults called: Physical therapy. Admission status: Observation.

## 2024-02-03 NOTE — Progress Notes (Signed)
 Echocardiogram 2D Echocardiogram has been performed.  [name] 02/03/2024, 8:55 AM

## 2024-02-03 NOTE — ED Notes (Signed)
 CCMD called.
